# Patient Record
Sex: Female | Born: 1960 | Hispanic: No | Marital: Single | Smoking: Never smoker
Health system: Southern US, Community
[De-identification: ages and names within clinical notes are randomized; demographics above are authoritative.]

## PROBLEM LIST (undated history)

## (undated) ENCOUNTER — Ambulatory Visit (HOSPITAL_COMMUNITY): Admission: EM | Source: Home / Self Care

## (undated) DIAGNOSIS — Z8669 Personal history of other diseases of the nervous system and sense organs: Secondary | ICD-10-CM

## (undated) DIAGNOSIS — H905 Unspecified sensorineural hearing loss: Secondary | ICD-10-CM

## (undated) DIAGNOSIS — E785 Hyperlipidemia, unspecified: Secondary | ICD-10-CM

## (undated) DIAGNOSIS — A182 Tuberculous peripheral lymphadenopathy: Secondary | ICD-10-CM

## (undated) DIAGNOSIS — R011 Cardiac murmur, unspecified: Secondary | ICD-10-CM

## (undated) DIAGNOSIS — R519 Headache, unspecified: Secondary | ICD-10-CM

## (undated) DIAGNOSIS — K219 Gastro-esophageal reflux disease without esophagitis: Secondary | ICD-10-CM

## (undated) DIAGNOSIS — R002 Palpitations: Secondary | ICD-10-CM

## (undated) DIAGNOSIS — IMO0001 Reserved for inherently not codable concepts without codable children: Secondary | ICD-10-CM

## (undated) DIAGNOSIS — D259 Leiomyoma of uterus, unspecified: Secondary | ICD-10-CM

## (undated) DIAGNOSIS — M5412 Radiculopathy, cervical region: Secondary | ICD-10-CM

## (undated) DIAGNOSIS — M26609 Unspecified temporomandibular joint disorder, unspecified side: Secondary | ICD-10-CM

## (undated) DIAGNOSIS — I341 Nonrheumatic mitral (valve) prolapse: Secondary | ICD-10-CM

## (undated) DIAGNOSIS — C50111 Malignant neoplasm of central portion of right female breast: Secondary | ICD-10-CM

## (undated) DIAGNOSIS — R9431 Abnormal electrocardiogram [ECG] [EKG]: Secondary | ICD-10-CM

## (undated) DIAGNOSIS — R51 Headache: Secondary | ICD-10-CM

## (undated) HISTORY — PX: ABDOMINAL HYSTERECTOMY: SHX81

## (undated) HISTORY — PX: OTHER SURGICAL HISTORY: SHX169

## (undated) HISTORY — PX: MOLE REMOVAL: SHX2046

## (undated) HISTORY — DX: Hyperlipidemia, unspecified: E78.5

## (undated) HISTORY — DX: Nonrheumatic mitral (valve) prolapse: I34.1

## (undated) HISTORY — DX: Radiculopathy, cervical region: M54.12

## (undated) HISTORY — PX: BREAST BIOPSY: SHX20

## (undated) HISTORY — DX: Personal history of other diseases of the nervous system and sense organs: Z86.69

## (undated) HISTORY — DX: Malignant neoplasm of central portion of right female breast: C50.111

## (undated) HISTORY — DX: Leiomyoma of uterus, unspecified: D25.9

## (undated) HISTORY — DX: Gastro-esophageal reflux disease without esophagitis: K21.9

## (undated) HISTORY — DX: Abnormal electrocardiogram (ECG) (EKG): R94.31

## (undated) HISTORY — DX: Unspecified temporomandibular joint disorder, unspecified side: M26.609

## (undated) HISTORY — DX: Unspecified sensorineural hearing loss: H90.5

---

## 2000-08-26 ENCOUNTER — Encounter: Payer: Self-pay | Admitting: Internal Medicine

## 2000-08-26 ENCOUNTER — Ambulatory Visit (HOSPITAL_COMMUNITY): Admission: RE | Admit: 2000-08-26 | Discharge: 2000-08-26 | Payer: Self-pay | Admitting: Internal Medicine

## 2000-11-18 ENCOUNTER — Other Ambulatory Visit: Admission: RE | Admit: 2000-11-18 | Discharge: 2000-11-18 | Payer: Self-pay | Admitting: Obstetrics and Gynecology

## 2001-03-06 ENCOUNTER — Encounter: Payer: Self-pay | Admitting: Obstetrics and Gynecology

## 2001-03-06 ENCOUNTER — Ambulatory Visit (HOSPITAL_COMMUNITY): Admission: RE | Admit: 2001-03-06 | Discharge: 2001-03-06 | Payer: Self-pay | Admitting: Obstetrics and Gynecology

## 2001-06-12 ENCOUNTER — Encounter: Admission: RE | Admit: 2001-06-12 | Discharge: 2001-06-12 | Payer: Self-pay | Admitting: Obstetrics and Gynecology

## 2001-06-12 ENCOUNTER — Encounter: Payer: Self-pay | Admitting: Obstetrics and Gynecology

## 2002-02-05 ENCOUNTER — Other Ambulatory Visit: Admission: RE | Admit: 2002-02-05 | Discharge: 2002-02-05 | Payer: Self-pay | Admitting: Obstetrics and Gynecology

## 2002-06-15 ENCOUNTER — Encounter: Admission: RE | Admit: 2002-06-15 | Discharge: 2002-06-15 | Payer: Self-pay | Admitting: Obstetrics and Gynecology

## 2002-06-15 ENCOUNTER — Encounter: Payer: Self-pay | Admitting: Obstetrics and Gynecology

## 2003-02-03 ENCOUNTER — Other Ambulatory Visit: Admission: RE | Admit: 2003-02-03 | Discharge: 2003-02-03 | Payer: Self-pay | Admitting: Obstetrics and Gynecology

## 2003-08-17 ENCOUNTER — Encounter: Admission: RE | Admit: 2003-08-17 | Discharge: 2003-08-17 | Payer: Self-pay | Admitting: Internal Medicine

## 2004-02-08 ENCOUNTER — Other Ambulatory Visit: Admission: RE | Admit: 2004-02-08 | Discharge: 2004-02-08 | Payer: Self-pay | Admitting: Obstetrics and Gynecology

## 2004-09-11 ENCOUNTER — Encounter: Admission: RE | Admit: 2004-09-11 | Discharge: 2004-09-11 | Payer: Self-pay | Admitting: Internal Medicine

## 2004-12-18 ENCOUNTER — Ambulatory Visit: Payer: Self-pay | Admitting: Internal Medicine

## 2004-12-20 ENCOUNTER — Encounter: Admission: RE | Admit: 2004-12-20 | Discharge: 2004-12-20 | Payer: Self-pay | Admitting: Otolaryngology

## 2004-12-21 ENCOUNTER — Encounter: Admission: RE | Admit: 2004-12-21 | Discharge: 2004-12-21 | Payer: Self-pay | Admitting: Otolaryngology

## 2005-10-02 ENCOUNTER — Encounter: Admission: RE | Admit: 2005-10-02 | Discharge: 2005-10-02 | Payer: Self-pay | Admitting: Internal Medicine

## 2005-10-03 ENCOUNTER — Other Ambulatory Visit: Admission: RE | Admit: 2005-10-03 | Discharge: 2005-10-03 | Payer: Self-pay | Admitting: Obstetrics and Gynecology

## 2005-10-09 ENCOUNTER — Ambulatory Visit: Payer: Self-pay | Admitting: Internal Medicine

## 2005-10-23 ENCOUNTER — Ambulatory Visit (HOSPITAL_COMMUNITY): Admission: RE | Admit: 2005-10-23 | Discharge: 2005-10-23 | Payer: Self-pay | Admitting: Internal Medicine

## 2006-02-06 LAB — HM COLONOSCOPY: HM Colonoscopy: NORMAL

## 2006-02-13 ENCOUNTER — Encounter (INDEPENDENT_AMBULATORY_CARE_PROVIDER_SITE_OTHER): Payer: Self-pay | Admitting: *Deleted

## 2006-02-13 ENCOUNTER — Ambulatory Visit (HOSPITAL_COMMUNITY): Admission: RE | Admit: 2006-02-13 | Discharge: 2006-02-13 | Payer: Self-pay | Admitting: Physician Assistant

## 2006-09-04 ENCOUNTER — Ambulatory Visit: Payer: Self-pay | Admitting: Internal Medicine

## 2006-09-04 LAB — CONVERTED CEMR LAB
ALT: 14 units/L (ref 0–40)
Albumin: 3.3 g/dL — ABNORMAL LOW (ref 3.5–5.2)
BUN: 11 mg/dL (ref 6–23)
Basophils Relative: 0.6 % (ref 0.0–1.0)
Chloride: 107 meq/L (ref 96–112)
GFR calc Af Amer: 138 mL/min
GFR calc non Af Amer: 114 mL/min
HDL: 44 mg/dL (ref >39.0)
Hemoglobin, Urine: NEGATIVE
Ketones, ur: NEGATIVE mg/dL
MCHC: 33.7 g/dL (ref 30.0–36.0)
Monocytes Absolute: 0.5 10*3/uL (ref 0.2–0.7)
Monocytes Relative: 10.4 % (ref 3.0–11.0)
Neutro Abs: 1.7 10*3/uL (ref 1.4–7.7)
Neutrophils Relative %: 34.9 % — ABNORMAL LOW (ref 43.0–77.0)
Platelets: 214 10*3/uL (ref 150–400)
Potassium: 4 meq/L (ref 3.5–5.1)
RBC: 5.25 M/uL — ABNORMAL HIGH (ref 3.87–5.11)
Specific Gravity, Urine: 1.025 (ref 1.000–1.03)
Total Bilirubin: 0.6 mg/dL (ref 0.3–1.2)
Total CHOL/HDL Ratio: 5.1
Total Protein, Urine: NEGATIVE mg/dL
Total Protein: 7.2 g/dL (ref 6.0–8.3)
Triglycerides: 171 mg/dL — ABNORMAL HIGH (ref 0–149)
Urobilinogen, UA: 0.2 (ref 0.0–1.0)
VLDL: 34 mg/dL (ref 0–40)

## 2006-09-11 ENCOUNTER — Ambulatory Visit: Payer: Self-pay | Admitting: Internal Medicine

## 2006-11-26 ENCOUNTER — Encounter: Admission: RE | Admit: 2006-11-26 | Discharge: 2006-11-26 | Payer: Self-pay | Admitting: Internal Medicine

## 2007-05-13 ENCOUNTER — Telehealth: Payer: Self-pay | Admitting: Internal Medicine

## 2007-05-14 DIAGNOSIS — I059 Rheumatic mitral valve disease, unspecified: Secondary | ICD-10-CM

## 2007-05-16 ENCOUNTER — Ambulatory Visit (HOSPITAL_COMMUNITY): Admission: RE | Admit: 2007-05-16 | Discharge: 2007-05-17 | Payer: Self-pay | Admitting: Obstetrics and Gynecology

## 2007-05-16 ENCOUNTER — Encounter (INDEPENDENT_AMBULATORY_CARE_PROVIDER_SITE_OTHER): Payer: Self-pay | Admitting: Obstetrics and Gynecology

## 2007-05-26 ENCOUNTER — Encounter: Payer: Self-pay | Admitting: Internal Medicine

## 2007-05-26 ENCOUNTER — Ambulatory Visit: Payer: Self-pay

## 2007-05-27 ENCOUNTER — Encounter: Payer: Self-pay | Admitting: Internal Medicine

## 2007-07-22 ENCOUNTER — Encounter: Payer: Self-pay | Admitting: *Deleted

## 2007-07-22 DIAGNOSIS — D259 Leiomyoma of uterus, unspecified: Secondary | ICD-10-CM | POA: Insufficient documentation

## 2007-07-22 DIAGNOSIS — J309 Allergic rhinitis, unspecified: Secondary | ICD-10-CM

## 2007-07-22 DIAGNOSIS — K219 Gastro-esophageal reflux disease without esophagitis: Secondary | ICD-10-CM | POA: Insufficient documentation

## 2007-07-22 DIAGNOSIS — Z9189 Other specified personal risk factors, not elsewhere classified: Secondary | ICD-10-CM | POA: Insufficient documentation

## 2007-09-01 ENCOUNTER — Ambulatory Visit: Payer: Self-pay | Admitting: Internal Medicine

## 2007-09-01 DIAGNOSIS — M5030 Other cervical disc degeneration, unspecified cervical region: Secondary | ICD-10-CM | POA: Insufficient documentation

## 2007-09-02 ENCOUNTER — Encounter: Payer: Self-pay | Admitting: Internal Medicine

## 2007-09-08 ENCOUNTER — Encounter: Payer: Self-pay | Admitting: Internal Medicine

## 2007-09-11 ENCOUNTER — Encounter: Payer: Self-pay | Admitting: Internal Medicine

## 2007-09-29 ENCOUNTER — Encounter: Payer: Self-pay | Admitting: Internal Medicine

## 2007-11-07 ENCOUNTER — Encounter: Payer: Self-pay | Admitting: Internal Medicine

## 2007-12-03 ENCOUNTER — Encounter: Payer: Self-pay | Admitting: Internal Medicine

## 2007-12-15 ENCOUNTER — Encounter: Admission: RE | Admit: 2007-12-15 | Discharge: 2007-12-15 | Payer: Self-pay | Admitting: Internal Medicine

## 2008-03-04 ENCOUNTER — Telehealth: Payer: Self-pay | Admitting: Internal Medicine

## 2008-03-22 ENCOUNTER — Telehealth: Payer: Self-pay | Admitting: Internal Medicine

## 2008-12-03 ENCOUNTER — Telehealth (INDEPENDENT_AMBULATORY_CARE_PROVIDER_SITE_OTHER): Payer: Self-pay | Admitting: *Deleted

## 2008-12-27 ENCOUNTER — Ambulatory Visit: Payer: Self-pay | Admitting: Internal Medicine

## 2008-12-27 LAB — CONVERTED CEMR LAB
ALT: 15 units/L (ref 0–35)
BUN: 15 mg/dL (ref 6–23)
Basophils Absolute: 0 10*3/uL (ref 0.0–0.1)
Basophils Relative: 0.7 % (ref 0.0–3.0)
Bilirubin Urine: NEGATIVE
Bilirubin, Direct: 0.2 mg/dL (ref 0.0–0.3)
CO2: 32 meq/L (ref 19–32)
Calcium: 9.7 mg/dL (ref 8.4–10.5)
Chloride: 103 meq/L (ref 96–112)
Cholesterol: 229 mg/dL — ABNORMAL HIGH (ref 0–200)
Eosinophils Absolute: 0.5 10*3/uL (ref 0.0–0.7)
HDL: 52.7 mg/dL (ref >39.00)
Hemoglobin, Urine: NEGATIVE
Hemoglobin: 14.1 g/dL (ref 12.0–15.0)
Lymphocytes Relative: 43.7 % (ref 12.0–46.0)
MCV: 79.5 fL (ref 78.0–100.0)
Monocytes Absolute: 0.5 10*3/uL (ref 0.1–1.0)
Total Bilirubin: 0.9 mg/dL (ref 0.3–1.2)
Total CHOL/HDL Ratio: 4
Total Protein, Urine: NEGATIVE mg/dL
Triglycerides: 145 mg/dL (ref 0.0–149.0)
WBC: 6.9 10*3/uL (ref 4.5–10.5)

## 2008-12-29 ENCOUNTER — Ambulatory Visit: Payer: Self-pay | Admitting: Internal Medicine

## 2008-12-29 DIAGNOSIS — M26609 Unspecified temporomandibular joint disorder, unspecified side: Secondary | ICD-10-CM | POA: Insufficient documentation

## 2008-12-30 ENCOUNTER — Encounter: Admission: RE | Admit: 2008-12-30 | Discharge: 2008-12-30 | Payer: Self-pay | Admitting: Internal Medicine

## 2009-01-03 ENCOUNTER — Telehealth: Payer: Self-pay | Admitting: Internal Medicine

## 2010-02-01 ENCOUNTER — Telehealth: Payer: Self-pay | Admitting: Internal Medicine

## 2010-02-20 ENCOUNTER — Ambulatory Visit: Payer: Self-pay | Admitting: Internal Medicine

## 2010-02-20 LAB — CONVERTED CEMR LAB
Albumin: 3.9 g/dL (ref 3.5–5.2)
Basophils Absolute: 0.1 10*3/uL (ref 0.0–0.1)
Basophils Relative: 0.9 % (ref 0.0–3.0)
Bilirubin Urine: NEGATIVE
CO2: 31 meq/L (ref 19–32)
Calcium: 9.6 mg/dL (ref 8.4–10.5)
GFR calc non Af Amer: 119.44 mL/min (ref >60)
HCT: 37.6 % (ref 36.0–46.0)
Hemoglobin, Urine: NEGATIVE
Hemoglobin: 12.7 g/dL (ref 12.0–15.0)
Ketones, ur: NEGATIVE mg/dL
Lymphocytes Relative: 40.8 % (ref 12.0–46.0)
Lymphs Abs: 2.4 10*3/uL (ref 0.7–4.0)
MCV: 79 fL (ref 78.0–100.0)
Platelets: 223 10*3/uL (ref 150.0–400.0)
RBC: 4.76 M/uL (ref 3.87–5.11)
Sodium: 141 meq/L (ref 135–145)
Specific Gravity, Urine: >=1.03 (ref 1.000–1.030)
Triglycerides: 149 mg/dL (ref 0.0–149.0)
Urine Glucose: NEGATIVE mg/dL
Urobilinogen, UA: 0.2 (ref 0.0–1.0)
Vit D, 25-Hydroxy: 60 ng/mL (ref 30–89)
WBC: 6 10*3/uL (ref 4.5–10.5)
pH: 5.5 (ref 5.0–8.0)

## 2010-02-22 ENCOUNTER — Ambulatory Visit: Payer: Self-pay | Admitting: Internal Medicine

## 2010-02-22 ENCOUNTER — Encounter: Payer: Self-pay | Admitting: Internal Medicine

## 2010-02-23 ENCOUNTER — Encounter: Admission: RE | Admit: 2010-02-23 | Discharge: 2010-02-23 | Payer: Self-pay | Admitting: Internal Medicine

## 2010-06-18 ENCOUNTER — Encounter: Payer: Self-pay | Admitting: Internal Medicine

## 2010-06-27 NOTE — Assessment & Plan Note (Signed)
Summary: CPX/NWS   Vital Signs:  Patient profile:   50 year old female Height:      65 inches Weight:      114 pounds BMI:     19.04 O2 Sat:      97 % on Room air Temp:     97.0 degrees F oral Pulse rate:   75 / minute BP sitting:   110 / 60  (left arm) Cuff size:   regular  Vitals Entered By: Cathy Woodward CMA (February 22, 2010 9:58 AM)  O2 Flow:  Room air CC: pt here for cpx, she would like to discuss changing omeprazole to pantoprazole. Pt also needs refill on flonase/ ab Comments Pt will get flu and tdap today/ ab  Vision Screening:      Vision Comments: Last Eye exam oct 2010 with change in lens prescription. DR Cathy Woodward   Primary Care Cathy Woodward:  [facility]  CC:  pt here for cpx and she would like to discuss changing omeprazole to pantoprazole. Pt also needs refill on flonase/ ab.  History of Present Illness: Presents for routine exam.   She does have continued acid dyspepsia and reflux. She does take sodium bicarb which does give relief. Due to a possible diagnosis of menier's she is careful with sodium. Would like to switch pantoprazole 40mg .   She is current with gyn. Has mammogram for tomorrow.  Last colonoscopy '07 - Dr. Ranae Woodward with a sessile polyp.  Hearing loss continues to be a problem along with tinnitis - audible heart beat right ear. She has been evaluated by Dr. Dorma Woodward in the past and will see him today.   Chart reviewed including last 2 D echo which did reveal MVP with mild MR. Dr. Jens Woodward writes of a possible h/o valsalva aneurysm. No known history to the patient nor is there any documentation in her medical record of the same. The 2D echo was negative in this regard.   Current Medications (verified): 1)  Nasal Saline 0.65 %  Soln (Saline) .... Use As Directed. 2)  Calcium 600 600 Mg  Tabs (Calcium Carbonate) .... Take One Tablet Once Daily 3)  Vitamin C 500 Mg  Tabs (Ascorbic Acid) .... Take One Tablet Once Daily 4)  Vitamin E 400 Unit  Caps (Vitamin E)  .... Take One Capsule Daily 5)  Fexofenadine Hcl 60 Mg  Tabs (Fexofenadine Hcl) .... Take 1 Tablet By Mouth Two Times A Day 6)  Flonase 50 Mcg/act Susp (Fluticasone Propionate) .Marland Kitchen.. 1 Spray Each Nostril Daily 7)  Cyclobenzaprine Hcl 10 Mg Tabs (Cyclobenzaprine Hcl) .Marland Kitchen.. 1 By Mouth Q8 As Needed 8)  Omeprazole 20 Mg Cpdr (Omeprazole) .Marland Kitchen.. 1 By Mouth Once Daily 9)  Topicort 0.25 % Crea (Desoximetasone)  Allergies (verified): 1)  ! * Minocyclin 2)  ! Erythromycin 3)  [name]  Past History:  Past Medical History: Last updated: 01-26-09 TMJ SYNDROME (ICD-524.60) CERVICAL RADICULOPATHY, RIGHT (ICD-723.4) * Hx of EXCISION OF CERVICAL LYMPH NODE ANTERIOR CERVICAL CHAIN CHICKENPOX, HX OF (ICD-V15.9) FIBROIDS, UTERUS (ICD-218.9) HEARING LOSS, SENSORINEURAL RIGHT (ICD-389.10) ALLERGIC RHINITIS (ICD-477.9) GERD (ICD-530.81) MITRAL VALVE PROLAPSE (ICD-424.0)      Past Surgical History: Last updated: Jan 26, 2009 * Hx of EXCISION OF CERVICAL LYMPH NODE ANTERIOR CERVICAL CHAIN Hysterectomy '09 (robotic)  Family History: Last updated: January 26, 2009 Father - deceased 106: esophageal cancer, DM,  Mother  - 1935: rheumatologic disorder with spontaneous resolution, antero-septal MI '98, hypothyroidism, HTN MAunt - breast cancer 2nd degreee history colon cancer  Risk Factors: Alcohol Use:  0 (12/29/2008) Caffeine Use: caffeinated tea (12/29/2008) Diet: healthy diet (12/29/2008) Exercise: no (12/29/2008)  Risk Factors: Smoking Status: never (12/29/2008)  Social History: 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 finish Residency [employer] Cathy Woodward. 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 Will be traveling to [place] in the next several months. (Sept '11)  Review of Systems       The patient complains of decreased hearing and severe indigestion/heartburn.  The patient denies anorexia, fever, weight loss, weight gain, hoarseness, chest pain,  syncope, dyspnea on exertion, peripheral edema, prolonged cough, headaches, abdominal pain, melena, hematochezia, incontinence, muscle weakness, transient blindness, difficulty walking, depression, abnormal bleeding, enlarged lymph nodes, angioedema, and breast masses.    Physical Exam  General:  Slender woman in no distress. Head:  normocephalic, atraumatic, and no abnormalities observed.   Eyes:  vision grossly intact, pupils equal, and pupils round.   C&S clear. Ears:  Right EAC clear and TM normal. Left EAC with cerumen but no impaction. TM appears normal Nose:  no external deformity and no external erythema.   Mouth:  good dentition, no gingival abnormalities, and pharynx pink and moist.   Neck:  supple, full ROM, no masses, no thyromegaly, and no carotid bruits.   Chest Wall:  No deformities, masses, or tenderness noted. Breasts:  deferred to gyn Lungs:  Normal respiratory effort, chest expands symmetrically. Lungs are clear to auscultation, no crackles or wheezes. Heart:  Regular rate and rhythm. Distinct mitral valve click. I-II/VI soft murmur at apex. Abdomen:  soft, non-tender, normal bowel sounds, no guarding, and no hepatomegaly.   Genitalia:  deferred to gyn Msk:  normal ROM, no joint tenderness, no joint swelling, no redness over joints, and no joint deformities.   Pulses:  2+ radial and DP pulses. difficulty finding PT pulses Extremities:  No clubbing, cyanosis, edema, or deformity noted with normal full range of motion of all joints.   Neurologic:  alert & oriented X3, cranial nerves II-XII intact-hearing not tested, strength normal in all extremities, gait normal, and DTRs symmetrical and normal.   Skin:  turgor normal, color normal, and no suspicious lesions.   Cervical Nodes:  no anterior cervical adenopathy and no posterior cervical adenopathy.   Psych:  Oriented X3, memory intact for recent and remote, normally interactive, and good eye contact.     Impression &  Recommendations:  Problem # 1:  HEARING LOSS, SENSORINEURAL RIGHT (ICD-389.10) On-going problem. She will be seeing Dr. Dorma Woodward today.  Problem # 2:  ALLERGIC RHINITIS (ICD-477.9) Symptoms wax and wane, location dependent. She continues to get relief with fexofenadine and Flonase.  Plan - Rx renewed.  Her updated medication list for this problem includes:    Nasal Saline 0.65 % Soln (Saline) ..... Use as directed.    Fexofenadine Hcl 60 Mg Tabs (Fexofenadine hcl) .Marland Kitchen... Take 1 tablet by mouth two times a day    Flonase 50 Mcg/act Susp (Fluticasone propionate) .Marland Kitchen... 1 spray each nostril daily  Problem # 3:  GERD (ICD-530.81) Break-through symptoms. She does get some relief with elevation of HOB. Pantoprazole does work well and she will sometimes supplement with baking soa in water at bedtime.  Plan - new Rx for pantoprazole.  The following medications were removed from the medication list:    Cimetidine 800 Mg Tabs (Cimetidine) .Marland Kitchen... 1 by mouth at bedtime Her updated medication list for this problem includes:    Pantoprazole Sodium 40 Mg Tbec (Pantoprazole sodium) .Marland KitchenMarland KitchenMarland KitchenMarland Kitchen  1 p qam  Problem # 4:  MITRAL VALVE PROLAPSE (ICD-424.0) 2 D echo reviewed as noted. No significant pathology, i.e. change in ventricle. Her symptoms are minimal. She does have intermittent palpitatons: both tachycardic and occasionally bradycardic. She is asymptomatic in either case, i.e. no syncope, near-syncope, SOB or chest pain.   Plan - if symptoms progress an event recorder my be helpful to elucidate a definite arrythmia.   Problem # 5:  Preventive Health Care (ICD-V70.0) Interval history without major illness, injury or surgery. Limited physical exam is normal. Labs are normal including lipid panel. Vitamin D level is 60!! She will continue daily Vit D @ 1000 iu daily. She has up-coming gyn visit and mammogram. She is current with colorectal cancer screening. Immunization: Tdap today, influenza vaccine today. She is  current with immunizations for international travel.   Currently enjoying her assigment in [place] - she will have been there a year at the completion of her current assignment. She looks forward to a family gathering in [place]. SHe does follow a healthy diet. She does exercise.   In summary - a delightful colleague who appears to be medically stable and doing well. She will return as needed or 1 year. If needed will try to provide assistance long distance, i.e. Rx for event recorder if she continues to have palpitations.   Complete Medication List: 1)  Nasal Saline 0.65 % Soln (Saline) .... Use as directed. 2)  Calcium 600 600 Mg Tabs (Calcium carbonate) .... Take one tablet once daily 3)  Vitamin C 500 Mg Tabs (Ascorbic acid) .... Take one tablet once daily 4)  Vitamin E 400 Unit Caps (Vitamin e) .... Take one capsule daily 5)  Fexofenadine Hcl 60 Mg Tabs (Fexofenadine hcl) .... Take 1 tablet by mouth two times a day 6)  Flonase 50 Mcg/act Susp (Fluticasone propionate) .Marland Kitchen.. 1 spray each nostril daily 7)  Cyclobenzaprine Hcl 10 Mg Tabs (Cyclobenzaprine hcl) .Marland Kitchen.. 1 by mouth q8 as needed 8)  Pantoprazole Sodium 40 Mg Tbec (Pantoprazole sodium) .Marland Kitchen.. 1 p qam 9)  Topicort 0.25 % Crea (Desoximetasone)  Other Orders: Tdap => 64yrs IM (04540) Admin 1st Vaccine (98119) Admin 1st Vaccine (14782) Flu Vaccine 63yrs + (95621) No Charge Patient Arrived (NCPA0) (NCPA0)   nt: Cathy Woodward Note: All result statuses are Final unless otherwise noted.  Tests: (1) BMP (METABOL)   Sodium                    141 mEq/L                   135-145   Potassium                 5.0 mEq/L                   3.5-5.1   Chloride                  106 mEq/L                   96-112   Carbon Dioxide            31 mEq/L                    19-32   Glucose                   88 mg/dL  70-99   BUN                       15 mg/dL                    1-61   Creatinine                0.6 mg/dL                    0.9-6.0   Calcium                   9.6 mg/dL                   4.5-40.9   GFR                       119.44 mL/min               >60  Tests: (2) CBC Platelet w/Diff (CBCD)   White Cell Count          6.0 K/uL                    4.5-10.5   Red Cell Count            4.76 Mil/uL                 3.87-5.11   Hemoglobin                12.7 g/dL                   81.1-91.4   Hematocrit                37.6 %                      36.0-46.0   MCV                       79.0 fl                     78.0-100.0   MCHC                      33.7 g/dL                   78.2-95.6   RDW                       14.2 %                      11.5-14.6   Platelet Count            223.0 K/uL                  150.0-400.0   Neutrophil %         [L]  42.1 %                      43.0-77.0   Lymphocyte %              40.8 %                      12.0-46.0   Monocyte %  7.8 %                       3.0-12.0   Eosinophils%         [H]  8.4 %                       0.0-5.0   Basophils %               0.9 %                       0.0-3.0   Neutrophill Absolute      2.5 K/uL                    1.4-7.7   Lymphocyte Absolute       2.4 K/uL                    0.7-4.0   Monocyte Absolute         0.5 K/uL                    0.1-1.0  Eosinophils, Absolute                             0.5 K/uL                    0.0-0.7   Basophils Absolute        0.1 K/uL                    0.0-0.1  Tests: (3) Hepatic/Liver Function Panel (HEPATIC)   Total Bilirubin           0.3 mg/dL                   0.4-5.4   Direct Bilirubin          0.1 mg/dL                   0.9-8.1   Alkaline Phosphatase      75 U/L                      39-117   AST                       17 U/L                      0-37   ALT                       16 U/L                      0-35   Total Protein             6.7 g/dL                    1.9-1.4   Albumin                   3.9 g/dL                    7.8-2.9  Tests: (4) TSH (TSH)   FastTSH  1.45 uIU/mL                 0.35-5.50  Tests: (5) Lipid Panel (LIPID)   Cholesterol          [H]  213 mg/dL                   0-454     ATP III Classification            Desirable:  < 200 mg/dL                    Borderline High:  200 - 239 mg/dL               High:  > = 240 mg/dL   Triglycerides             149.0 mg/dL                 0.9-811.9     Normal:  <150 mg/dL     Borderline High:  147 - 199 mg/dL   HDL                       82.95 mg/dL                 >62.13   VLDL Cholesterol          29.8 mg/dL                  0.8-65.7  CHO/HDL Ratio:  CHD Risk                             4                    Men          Women     1/2 Average Risk     3.4          3.3     Average Risk          5.0          4.4     2X Average Risk          9.6          7.1     3X Average Risk          15.0          11.0                           Tests: (6) UDip w/Micro (URINE)   Color                     Yellow       RANGE:  Yellow;Lt. Yellow   Clarity                   CLEAR                       Clear   Specific Gravity          >=1.030                     1.000 - 1.030   Urine Ph                  5.5  5.0-8.0   Protein                   NEGATIVE                    Negative   Urine Glucose             NEGATIVE                    Negative   Ketones                   NEGATIVE                    Negative   Urine Bilirubin           NEGATIVE                    Negative   Blood                     NEGATIVE                    Negative   Urobilinogen              0.2                         0.0 - 1.0   Leukocyte Esterace        TRACE                       Negative   Nitrite                   NEGATIVE                    Negative   Urine WBC                 0-2/hpf                     0-2/hpf   Urine Epith               Rare(0-4/hpf)               Rare(0-4/hpf)   Urine Bacteria            Rare(<10/hpf)               None  Tests: (7) Cholesterol LDL - Direct (DIRLDL)   Cholesterol LDL - Direct                             137.0 mg/dL Tests: (1) Vitamin D (16-XWRUEAV) (40981)  Vitamin D (25-Hydroxy)                             60 ng/mL                    30-89Prescriptions: TOPICORT 0.25 % CREA (DESOXIMETASONE)   #0 x 0   Entered and Authorized by:   Jacques Navy MD   Signed by:   Cathy Woodward CMA on 02/22/2010   Method used:   Handwritten   RxID:   [id number] FEXOFENADINE HCL 60 MG  TABS (FEXOFENADINE HCL) Take 1 tablet by mouth two times a day  #180 x 3  Entered and Authorized by:   Jacques Navy MD   Signed by:   Cathy Woodward CMA on 02/22/2010   Method used:   Handwritten   RxID:   [id number] FLONASE 50 MCG/ACT SUSP (FLUTICASONE PROPIONATE) 1 spray each nostril daily  #3 mth x 3   Entered and Authorized by:   Jacques Navy MD   Signed by:   Jacques Navy MD on 02/22/2010   Method used:   Electronically to        [facility]* (retail)             ,          Ph: [phone]       Fax: [phone]   RxID:   [id number] PANTOPRAZOLE SODIUM 40 MG TBEC (PANTOPRAZOLE SODIUM) 1 p qAM  #90 x 3   Entered and Authorized by:   Jacques Navy MD   Signed by:   Jacques Navy MD on 02/22/2010   Method used:   Electronically to        [facility]* (retail)             ,          Ph: [phone]       Fax: [phone]   RxID:   [id number]    Immunizations Administered:  Tetanus Vaccine:    Vaccine Type: Tdap    Site: left deltoid    Mfr: GlaxoSmithKline    Dose: 0.5 ml    Route: IM    Given by: Ami Bullins CMA    Exp. Date: 02/16/2012    Lot #: [id number]    VIS given: 04/14/08 version given February 22, 2010.     Flu Vaccine Consent Questions     Do you have a history of severe allergic reactions to this vaccine? no    Any prior history of allergic reactions to egg and/or gelatin? no    Do you have a sensitivity to the preservative Thimersol? no    Do you have a past history of Guillan-Barre  Syndrome? no    Do you currently have an acute febrile illness? no    Have you ever had a severe reaction to latex? no    Vaccine information given and explained to patient? yes    Are you currently pregnant? no    Lot Number:[id number]   Exp Date:11/25/2010   Site Given  Right Deltoid IMlu

## 2010-06-27 NOTE — Progress Notes (Signed)
Summary: LABs  Phone Note Call from Patient Call back at Work Phone [phone]   Summary of Call: Patient is requesting Vit D level added to upcomming labs?  Initial call taken by: Lamar Sprinkles, CMA,  February 01, 2010 11:08 AM  Follow-up for Phone Call        ok 524.6 Follow-up by: Jacques Navy MD,  February 01, 2010 3:36 PM  Additional Follow-up for Phone Call Additional follow up Details #1::        Pt informed  Additional Follow-up by: Lamar Sprinkles, CMA,  February 01, 2010 4:42 PM

## 2010-09-26 ENCOUNTER — Ambulatory Visit (INDEPENDENT_AMBULATORY_CARE_PROVIDER_SITE_OTHER): Payer: BC Managed Care – PPO | Admitting: Internal Medicine

## 2010-09-27 ENCOUNTER — Ambulatory Visit (INDEPENDENT_AMBULATORY_CARE_PROVIDER_SITE_OTHER): Payer: BC Managed Care – PPO | Admitting: Internal Medicine

## 2010-09-27 DIAGNOSIS — Z111 Encounter for screening for respiratory tuberculosis: Secondary | ICD-10-CM

## 2010-09-27 DIAGNOSIS — R1031 Right lower quadrant pain: Secondary | ICD-10-CM

## 2010-09-27 NOTE — Progress Notes (Signed)
  Subjective:    Patient ID: Cathy Woodward, female    DOB: 1961-01-29, 50 y.o.   MRN: [id number]  HPI Persisten RLQ pain that can come and go. The last episode persisted for 2 weeks with pain several times a day. No fevers documented, no rigors. No change in bowel habit. Had colonoscopy '06 (Jothi Mann)-unremarkable study. Weight is variable - per usual. She is s/p hysterectomy.  Past Medical History  Diagnosis Date  . Temporomandibular joint disorders, unspecified   . Brachial neuritis or radiculitis NOS   . Chickenpox   . Leiomyoma of uterus, unspecified   . Right-sided sensorineural hearing loss   . Allergic rhinitis   . GERD (gastroesophageal reflux disease)   . MVP (mitral valve prolapse)    Past Surgical History  Procedure Date  . Excision of cervical lymph node anterior cervical chain   . Robotic assisted lap vaginal hysterectomy 2009   Family History  Problem Relation Age of Onset  . Rheumatologic disease Mother   . Heart attack Mother     Anterio septal MI '98  . Thyroid disease Mother     hypo  . Hypertension Mother   . Diabetes Father   . Breast cancer Maternal Aunt   . Colon cancer Other    History   Social History  . Marital Status: Single    Spouse Name: N/A    Number of Children: N/A  . Years of Education: N/A   Occupational History  . Not on file.   Social History Main Topics  . Smoking status: Not on file  . Smokeless tobacco: Not on file  . Alcohol Use:   . Drug Use:   . Sexually Active:    Other Topics Concern  . 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 Will be traveling to [place] in the next several mths (Sept '11)       Review of Systems  Negative review of systems     Objective:   Physical Exam Slender woman in no acute distress. HEENT- C&S clear Abdomen - BS+ x  4, no HSM, no guarding or rebound tenderness. Point of tenderness is very low in the right lower quadrant near the inquinal ligament. Too lateral to be an ovary.. NO mass.       Assessment & Plan:  1. RLQ abdominal pain - exam suggests MSK origin of pain vs smoldering appendicitis vs (remote possibility) right ovarian diseases.  Plan - CT abdomen/pelvis with contrast.

## 2010-09-28 ENCOUNTER — Encounter: Payer: Self-pay | Admitting: Internal Medicine

## 2010-09-28 ENCOUNTER — Ambulatory Visit (INDEPENDENT_AMBULATORY_CARE_PROVIDER_SITE_OTHER)
Admission: RE | Admit: 2010-09-28 | Discharge: 2010-09-28 | Disposition: A | Discharge status: Home / Self Care | Payer: BC Managed Care – PPO | Source: Ambulatory Visit | Attending: Internal Medicine | Admitting: Internal Medicine

## 2010-09-28 DIAGNOSIS — R109 Unspecified abdominal pain: Secondary | ICD-10-CM

## 2010-09-28 MED ORDER — IOHEXOL 300 MG/ML  SOLN
80.0000 mL | Freq: Once | INTRAMUSCULAR | Status: AC | PRN
  Administered 2010-09-28: 80 mL via INTRAVENOUS

## 2010-09-29 ENCOUNTER — Encounter: Payer: Self-pay | Admitting: Internal Medicine

## 2010-09-29 NOTE — Progress Notes (Signed)
Addended byRosalio Macadamia, Melvin Marmo on: 09/29/2010 10:43 AM   Modules accepted: Orders

## 2010-10-10 NOTE — Op Note (Signed)
NAME:  Cathy Woodward, Cathy Woodward               ACCOUNT NO.:  [id number]   MEDICAL RECORD NO.:  [id number]          PATIENT TYPE:  OIB   LOCATION:  1529                         FACILITY:  [facility]   PHYSICIAN:  Crist Fat. Rivard, M.D. DATE OF BIRTH:  1961-05-14   DATE OF PROCEDURE:  05/16/2007  DATE OF DISCHARGE:                               OPERATIVE REPORT   PREOPERATIVE DIAGNOSIS:  Symptomatic uterine fibroids.   POSTOPERATIVE DIAGNOSIS:  Symptomatic uterine fibroids.   ANESTHESIA:  General.   PROCEDURE:  Robotic assisted hysterectomy.   SURGEON:  Crist Fat. Rivard, M.D.   ASSISTANTMarquis Lunch. Adline Peals.   PROCEDURE IN DETAIL:  After being informed of the planned procedure with  possible complications including bleeding, infection, injury to bowel,  bladder or ureters and need for laparotomy, informed consent was  obtained.  The patient is taken to OR #10, given general anesthesia with  endotracheal intubation without complication.  She is placed in the  lithotomy position with arms padded and tucked on each side and knee  high sequential compression devices.  Her chest is taped to the table.  She is prepped and draped in a sterile fashion.   Gyn exam reveals a greatly enlarged uterus 16 weeks in size, filling the  pelvic cavity completely, not mobile and adnexa are not felt.  We  attempt to insert a weighted speculum into the vagina but the vaginal  opening is too small and would not allow a normal sized weighted  speculum.  A Bonnano weighted speculum is then used successfully.  With  Deaver retraction of the anterior vaginal wall we are able to visualize  the cervix and grasp it with a tenaculum forceps placed on the anterior  lip of the cervix.  The cervix was then easily dilated using Hegar  dilator until #31 and the uterus is sounded at 9 cm.  A 6 cm RUMI  intrauterine manipulator with a #3 KOHring as well as a vaginal occluder  are assembled together.  Attempt at insertion is  unsuccessful due to the  small vaginal opening and it is absolutely impossible to enter the  KOHring in the vagina.  Decision is made to proceed with a midline  episiotomy which releases enough space to place the RUMI intrauterine  manipulator with its KOHring and vaginal occluder.  The KOHring is then  sutured to the cervix with 3-0 Vicryl suture.  The RUMI balloon is  inflated with 2 mL of saline.  A #10 Foley catheter is inserted without  difficulty.   Measuring from the fundus of the uterus at 12 cm we decide our camera  trocar placement and infiltrate that area with Marcaine 0.25 and perform  a vertical 10 mm incision which is brought down sharply to the fascia.  The fascia is identified easily and grasped with a tenaculum with the  Kocher forceps and incised.  Peritoneum is entered bluntly.  A  pursestring suture of zero Vicryl is placed on the fascia and a 10 mm  Hassan trocar is inserted easily in the abdominal cavity, held in place  with the  pursestring suture.  This allows for insufflation of the  pneumoperitoneum with CO2 at a maximum pressure of 16 mmHg.  The camera  is inserted for first evaluation of the abdominal pelvic cavity.  The  uterus is greatly enlarged by multiple fibroids, giving it a total  volume of approximately 16 weeks.  The fundus of the uterus reaches the  sacral promontory.  The dominant fibroid is anterior and measures  approximately 12 cm.  There is also multiple posterior fibroid and a  large left lower uterine fibroid.  Adnexa are seen and normal.  Appendix  is normal.  Liver is normal.  Gallbladder is normal.   Our robotic trocar placement is then decided and we placed two 8 mm  robotic trocar on the left and one 8 mm robotic trocar on the right as  well as a 10 mm patient side assistant trocar on the right after  infiltrating every site with Marcaine 0.25 and this was done under  direct visualization.   We can now safely dock the robot.  A  monopolar scissors is inserted in  arm #1, a PK gyrus forceps in arm #2 and a tenaculum in arm #3.   Console time starts at 9:50.  The length of time necessary to start  console time is explained by the fact that we had to change the camera  3x due to a problem with color monitoring.  Once seated we are able to  use the fourth arm to mobilize the uterus and we proceed systematically.  Starting with the right side we cauterize the utero-ovarian ligament and  section it, cauterize the tube and section it and cauterize the round  ligament and section it. This gives [place] access to the anterior leaf of  the broad ligament all the way to just above the fundus of the uterus  but we are unable to go into the anterior cul-de-sac due to the large  dominant anterior fibroid.  The posterior sheath of the broad ligament  is accessible easily by mobilizing the uterus even more anteriorly using  the fourth arm.  This posterior sheath is dissected down all the way to  the [facility] keeping the ureter under direct visualization.  With this  posterior approach we are able to completely skeletonize the uterine  vessel bundle.  This bundle is then grasped with the PK forceps and  cauterized.  We then go on the left side and proceed in the same  fashion, cauterizing and sectioning the utero-ovarian ligament,  cauterizing and sectioning the tube, cauterizing and sectioning the  round ligament.  Again, the anterior sheath of the broad ligament can  partially be section and dissected down but not all the way anteriorly.  We proceed with a posterior approach again and dissect the posterior  leaf of the broad ligament all the way down to the [facility] keeping the  ureter under direct visualization.  To achieve that we have to proceed  with lysis of adhesion with the sigmoid colon and the left pelvic wall.  Again, the uterine artery bundle is isolated away from the ureter,  grasped with the PK forceps and cauterized and  then it was sectioned.  With pressure applied on the KOHring we can now proceed with three-  quarters of the colpotomy using monopolar scissors after insufflating  the vaginal occluder.  Now using the fourth arm we completely move the  uterus to the left which gives [place] access to the anterior cul-de-sac and  we  are able to finish opening the anterior sheath of the anterior broad  ligament and bring the bladder down away from the vaginal fornix.  We  then mobilized the uterus to the extreme left and do the same on the  right side with the right anterior sheath of the broad ligament.  We now  are able to finish the colpotomy anteriorly with monopolar scissors and  release the uterus entirely.   The 10 mm patient side assistant trocar is replaced for a 15 mm  morcellator.  The total console time to this point is 1 hour and 30  minutes.   We then proceed with systematic morcellation of the whole specimen which  takes another 1-1/2 hours.  We then irrigate profusely with warm saline  and systematically inspect the abdominal pelvic cavity to remove all  small debris of the specimen.   The instruments are then changed for a suture cut in arm #1, a needle  holder in arm #2 and PK forceps in arm #3.  Using zero Vicryl we proceed  with closure of the vaginal cuff in figure-of-eight stitches.  We then  irrigate profusely with warm saline and complete hemostasis using  cauterization on peritoneal edges.  All pedicles are inspected and  hemostasis is deemed adequate.  Both ureters are well visualized, are  normal with good peristalsis and no dilatation.   An Interceed sheet is separated in half and deposited in the cul-de-sac  covering the vaginal cuff completely.  All instruments were then  removed.  The robot was undocked.  All trocars are removed after  evacuating the pneumoperitoneum.   The fascia of the supraumbilical incision is closed using the previously  placed pursestring suture of  zero Vicryl.  The fascia of the patient  side assistant trocar is closed with a figure-of-eight stitch of zero  Vicryl.  All incisions are then closed with subcuticular suture of 3-0  Monocryl and Dermabond.   The vaginal occluder is removed from the vagina and we proceed with  closure of the episiotomy in the usual fashion using 3-0 Vicryl.   Instruments and sponge count is complete x2.  Estimated blood loss is 50  mL.  The procedure is very well tolerated by the patient who is taken to  the recovery room in a well and stable condition.   The specimen is composed of the uterus and is weighed at 824 grams and  is sent to pathology.      Crist Fat Rivard, M.D.  Electronically Signed     SAR/MEDQ  D:  05/16/2007  T:  05/18/2007  Job:  [id number]

## 2010-10-10 NOTE — H&P (Signed)
NAME:  Cathy Woodward, Cathy Woodward               ACCOUNT NO.:  [id number]   MEDICAL RECORD NO.:  [id number]        PATIENT TYPE:  LAMB   LOCATION:                                FACILITY:  [facility]   PHYSICIAN:  Crist Fat. Rivard, M.D. DATE OF BIRTH:  1961/01/29   DATE OF ADMISSION:  05/16/2007  DATE OF DISCHARGE:                              HISTORY & PHYSICAL   HISTORY OF PRESENT ILLNESS:  Ms. Southgate is a 50 year old single Asian  female gravida 0 who presents for robot-assisted hysterectomy because of  symptomatic uterine fibroids.  For the past 10 years the patient has had  increasingly enlarging fibroids and menorrhagia.  The menorrhagia has  been managed successfully with birth control pills, but the mass effect  of her enlarging uterus has caused other discomforts.  She experiences  pelvic pressure causing the radiation of pain down her right leg and  back, urinary urgency, urinary frequency, and constipation.  She denies  any fever, nausea, vomiting, diarrhea or vaginitis symptoms.  Based on a  histogram along with an ultrasound in October 2002 revealed a uterus  measuring 11 x 5.9 cm along with three fibroids two of which measured  6.2 cm and one 2.5 cm.  In July 2008 the patient's uterus was found on  exam to measure 18-20 weeks' size, and increase in her symptoms was also  noted.  Fibroid management options both medical and surgical were  reviewed with patient.  In consideration of definitive surgical  management the patient chose a course of Lupron Depot 11.25 mg to shrink  the size of her uterus.  A follow-up ultrasound in December 2008  demonstrated a uterus measuring 13.9 cm x 9.3 cm x 12.8 cm along with  two anterior fibroids measuring 6.7 cm and 4.3 cm; a fundal fibroid  measuring 5.6 cm and two posterior fibroids measuring 6.1 cm and 4.9 cm,  respectively.  The patient's ovaries were obscured by the fibroids.  On  exam the patient's uterus had decreased to 14 weeks' size.  Given  the  success of the Lupron Depot therapy the patient has decided to proceed  with a robot-assisted hysterectomy for definitive management of her  symptoms.   OBSTETRIC HISTORY:  Gravida 0.   GYNECOLOGICAL HISTORY:  Menarche 50 years old.  The patient's last  menstrual period February 20, 2007.  Method of contraception:  Abstinence.  The patient denies any history of sexually transmitted  diseases or history of abnormal Pap smears.  Her last normal Pap smear  was May 2007.  Last normal mammogram July 2008.   PAST MEDICAL HISTORY:  TMJ, migraines, mitral valve prolapse with mild  mitral regurgitation (asymptomatic with cardiac evaluation in 2002),  gastroesophageal reflux disease, anemia, acne, scrofula, allergic  rhinitis, eczema, and inflammatory colon polyp.   SURGICAL HISTORY:  1967 left node removal, cervical lymph node removal,  2000 right breast cyst aspiration, 2002 hysteroscopy.  She denies any  history of blood transfusions or problems with anesthesia.   FAMILY HISTORY:  Cardiovascular disease, asthma, cancer (esophageal  breast cancer, colon cancer), irritable bowel syndrome, questionable  rheumatoid  arthritis, and diabetes mellitus.   HABITS:  The patient does not use tobacco.  She rarely consumes alcohol.  Denies any illicit drug use.   SOCIAL HISTORY:  The patient is single, and she works as a Development worker, community.   MEDICATIONS:  1. Fexofenadine 60 mg twice daily.  2. Rhinocort 1 spray per nostril daily.  3. Omeprazole 20 mg daily.  4. Multivitamin one tablet daily.  5. Vitamin E 400 international units daily.  6. Calcium 600 mg with 800 international units of vitamin D daily.   ALLERGIES:  MINOCIN causes dysequilibrium, PREVACID causes a rash,  ASPIRIN vertigo.  (The patient can, however, take ibuprofen and  naproxen).  ERYTHROMYCIN causes GI upset.   REVIEW OF SYSTEMS:  The patient has a history of high frequency hearing  loss, tinnitus, frequent headaches.   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  Denies  any chest pain, shortness of breath, vision changes, and except as is  mentioned in History of Present Illness the patient's Review of Systems  is negative.   PHYSICAL EXAMINATION:  VITAL SIGNS:  Blood pressure 120/70, weight 117,  pulse 64, height 5 feet 7-1/2 inches tall.  NECK:  Supple without masses.  There is no thyromegaly or cervical  adenopathy.  HEART:  Regular rate and rhythm.  LUNGS:  Clear.  BACK:  No CVA tenderness.  ABDOMEN:  No tenderness though there is a mass arising from the pelvis  which is firm at approximately 3 fingerbreadths above the symphysis  pubis.  There is no organomegaly.  EXTREMITIES:  No clubbing, cyanosis or edema.  PELVIC:  EG/BUS is normal.  Vagina is normal.  Uterus appears 14 weeks'  size and is mobile.  Adnexa is not palpable due to mass effect of the  uterus.   IMPRESSION:  Symptomatic uterine fibroids.   DISPOSITION:  A discussion was held with the patient regarding the  indications for her procedure along with its risks which include but are  not limited to reaction to anesthesia, damage to adjacent organs,  infection, excessive bleeding, and the possibility that an open  abdominal incision may be needed to complete her surgery safely.  The  patient further understands that she will experience transient  postoperative facial edema, that her procedure will be longer compared  with traditional open laparotomy, that her hospital stay is expected to  be 1-2 days, and that she is expected to return to her regular  activities within 2-3 weeks.  The patient has consented to proceed with  a robot-assisted hysterectomy at [facility] on  May 16, 2007 at 7:30 a.m.  She was given a MiraLax bowel prep to be  completed 24 hours  prior to her surgical procedure.      Elmira J. Adline Peals.      Crist Fat Rivard, M.D.  Electronically Signed   EJP/MEDQ  D:  05/14/2007  T:  05/14/2007  Job:  [id number]

## 2010-10-13 NOTE — Op Note (Signed)
NAME:  Cathy Woodward, Cathy Woodward               ACCOUNT NO.:  [id number]   MEDICAL RECORD NO.:  [id number]          PATIENT TYPE:  AMB   LOCATION:  ENDO                         FACILITY:  [facility]   PHYSICIAN:  Anselmo Rod, M.D.  DATE OF BIRTH:  [birth year]   DATE OF PROCEDURE:  02/13/2006  DATE OF DISCHARGE:                                 OPERATIVE REPORT   PROCEDURE PERFORMED:  Esophagogastroduodenoscopy with gastric biopsies.   ENDOSCOPIST:  Anselmo Rod, M.D.   INSTRUMENT USED:  Olympus video panendoscope.   INDICATION FOR PROCEDURE:  A 50 year old female with a history of severe  reflux on PPIs.  Rule out Barrett's mucosa, esophagitis, etc.   PREPROCEDURE PREPARATION:  Informed consent was procured from the patient.  The patient was fasted for 4 hours prior to the procedure.  The risks and  benefits of the procedure were discussed with her in great detail.   PREPROCEDURE PHYSICAL:  VITAL SIGNS:  The patient had stable vital signs.  NECK:  Supple.  CHEST:  Clear to auscultation.  S1, S2 regular.  ABDOMEN:  Soft with a palpable mass in the suprapubic area.  The patient has  a known history of fibroids.  No hepatosplenomegaly appreciated.   DESCRIPTION OF PROCEDURE:  The patient was placed in the left lateral  decubitus position and sedated with 60 mcg of fentanyl and 6 mg of Versed  given intravenously in slow incremental doses. Once the patient was  adequately sedate and maintained on low-flow oxygen and continuous cardiac  monitoring, the Olympus video panendoscope was advanced through the  mouthpiece, over the tongue, into the esophagus under direct vision.  The  entire esophagus appeared normal with no evidence of ring, stricture,  masses, esophagitis or Barrett's mucosa.  The scope was then advanced into  the stomach.  No evidence of a hiatal hernia was noted on high retroflexion.  A few gastric polyps were biopsied from the proximal stomach and gastric  biopsies were done from  the antrum as well.  There was evidence of moderate  diffuse gastritis with old heme overlying some of the areas.  The proximal  small bowel appeared normal.  There was no outlet obstruction.  The patient  tolerated the procedure well without complication.   IMPRESSION:  1. Normal-appearing esophagus with no evidence of stricture, esophagitis      or Barrett's mucosa.  2. Moderate diffuse gastritis with few sessile polyps in the proximal      stomach, multiple biopsies done to rule out Helicobacter pylori.  3. Normal proximal small bowel.   RECOMMENDATIONS:  1. Await pathology results.  2. Avoid all nonsteroidals including aspirin for now.  3. Continue with PPIs.  4. Treat with antibiotics if H. pylori present on biopsies.  5. Proceed with a colonoscopy at this time.  Further recommendations made      thereafter.      Anselmo Rod, M.D.  Electronically Signed     JNM/MEDQ  D:  02/13/2006  T:  02/14/2006  Job:  [id number]

## 2010-10-13 NOTE — Op Note (Signed)
NAME:  Cathy Woodward, Cathy Woodward               ACCOUNT NO.:  [id number]   MEDICAL RECORD NO.:  [id number]          PATIENT TYPE:  AMB   LOCATION:  ENDO                         FACILITY:  [facility]   PHYSICIAN:  Anselmo Rod, M.D.  DATE OF BIRTH:  1960/06/15   DATE OF PROCEDURE:  02/13/2006  DATE OF DISCHARGE:                                 OPERATIVE REPORT   PROCEDURE PERFORMED:  Colonoscopy with snare polypectomy x1.   ENDOSCOPIST:  Anselmo Rod, M.D.   INSTRUMENT USED:  Olympus video colonoscope (pediatric adjustable scope).   INDICATIONS FOR PROCEDURE:  A 51 year old female with a history of change in  bowel habits and family history of colon cancer in a paternal aunt who was  diagnosed in her 15s to rule out colonic polyps, masses, etc.   PREPROCEDURE PREPARATION:  Informed consent was procured from the patient.  The patient fasted for 8 hours prior to the procedure after being prepped  with 20 OsmoPrep pills the night of and 12 OsmoPrep pills the morning of the  procedure. The risks and benefits of the procedure including a 10% miss rate  of cancer and polyps was discussed with the patient as well.   PREPROCEDURE PHYSICAL:  VITAL SIGNS:  The patient has stable vital signs.  NECK:  Supple.  CHEST:  Clear to auscultation. S1 and S2 regular.  ABDOMEN:  Soft with normal bowel sounds.   DESCRIPTION OF PROCEDURE:  The patient was placed in a left lateral  decubitus position and sedated with an additional 30 mcg of fentanyl and 1  mg of Versed in slow incremental doses. Once the patient was adequately  sedated and maintained on low-flow oxygen and continuous cardiac monitoring,  the Olympus video colonoscope was advanced from the rectum to the cecum. The  appendiceal orifice and ileocecal valve were clearly visualized and  photographed. The terminal ileum appeared healthy and without lesions. Small  sessile polyp was removed by hot snare from 35 cm. Retroflexion in the  rectum revealed no  abnormalities. The patient tolerated the procedure well  without immediate complications.   IMPRESSION:  Small sessile polyp removed by hot snare from 35 cm. Otherwise  normal colonoscopy of the terminal ileum.   RECOMMENDATIONS:  1. Continue a high-fiber diet with liberal fluid intake.  2. Await pathology results.  3. Repeat colonoscopy depending pathology results.  4. Outpatient followup in the next two weeks or earlier if need be.      Anselmo Rod, M.D.  Electronically Signed     JNM/MEDQ  D:  02/13/2006  T:  02/14/2006  Job:  [id number]

## 2010-10-13 NOTE — Letter (Signed)
September 22, 2007    Isla Pence, M.D.  [address]  [place], [place] [address]   RE:  Cathy Woodward, Cathy Woodward  MRN:  [id number]  /  DOB:  Oct 13, 1960   Dear Cathy Woodward:   I have received a comprehensive report from [facility],  Sherry Cortwright.  This looks like a very comprehensive and full  examination.  Treatment plan seems very reasonable.   I would certainly appreciate your feedback as to the quality of service  that you received.  I do refer a lot of patients to [facility] but you are one of my more informed patients who can really  give me a professional assessment of the services that they offered.  Having this kind of information strongly reinforces in me my referral  pattern and of course, if there are problems I need to know that as  well.   I sure hope that your neck is doing better and that they are able to  help you.  As always, it was great seeing you.   [place]  Please feel free to contact me  at any time.  As always I remain yours truly.    Sincerely,      Rosalyn Gess. Norins, MD  Electronically Signed    MEN/MedQ  DD: 09/22/2007  DT: 09/22/2007  Job #: [id number]

## 2010-10-13 NOTE — Assessment & Plan Note (Signed)
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  She was last seen in the office on Oct 09, 2005.   INTERVAL HISTORY:  The patient did have colonoscopy in 2007 by Dr.  Anselmo Rod.  She was found to have an inflammatory polyp.  No other  abnormalities were noted.   The patient, since her visit on Oct 09, 2005, did have a CT angio  performed Oct 23, 2005.  The CT had a very complex, detailed report.  The bottom line is that the patient did not have any significant right  internal carotid artery stenosis.  The left internal carotid was normal.  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  The proximal cavernous and distal petrous  portion of the internal carotid artery is suboptimally visualized.  Of  note, the patient has done well.  She has had no significant further  episodes of vertigo.  She definitely has an ASPIRIN allergy because she  has re-challenged herself and had recurrent symptoms.  There is still a  question of whether she has a variant of Meniere's disease.   GYN:  The patient continues to follow up with her gynecologist, Dr.  Dierdre Forth, for a very large uterine fibroid.  She has had uterine  hysteroscopy.  At this point, she is contemplating her options.  She  definitely would not want surgery for at least a year.  She wants to see  what happens as she moves through menopause.  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   PAST MEDICAL HISTORY:  Well-documented and summarized in my note of December 18, 2004, with no changes except as noted above.   CURRENT MEDICATIONS:  1. Protonix 40 mg daily.  2. Cathy Woodward 1 spray per nostril daily.  3. Saline nasal wash daily.  4. Cathy Woodward OCP to help with menorrhagia related to her fibroid      uterus.  5. Multivitamins.  6. Calcium.  7. Vitamin C.  8. Vitamin E 400 IU daily.  9. Fexofenadine 60 mg b.i.d.   The patient reports having had a mammogram in 2007 that was normal.  She  has had evaluation by Dr. Pennie Rushing in 2007, and is scheduling an  appointment for 2008.  The patient has had a tetanus booster.   REVIEW OF SYSTEMS:  Negative for any constitutional problems with no  fevers, sweats, or chills.  She has had some minor weight loss since her  last visit, dropping from 122 to 117 pounds.  Estimated BMI at  approximately 20.  The patient has had an eye exam in the last 24  months.  No ENT complaints.  Cardiovascular with rare PVCs exacerbated  with exertion, but basically asymptomatic most of the time.  To does  have a recent cough, which she attributes to allergy.  No GI, GU,  musculoskeletal complaints otherwise.   INTERVAL SOCIAL HISTORY:  The patient continues to work in [employer]  with the last assignment being in [place] in the [place] area.  Her next  assignment beginning next week will be 2 months out in [place].  The  patient's brother remains in [place].  Her mother is doing well and is  actually visiting in [place].   LIMITED EXAM:  Temperature 97.3, blood pressure 99/67, pulse 69, weight  117.  GENERAL APPEARANCE:  This is a very slender woman in no acute distress.  HEENT:  Normocephalic, atraumatic.  EACs and TMs unremarkable.  Oropharynx with native dentition in good repair.  No buccal or palatal  lesions were noted.   Posterior pharynx was clear.  Conjunctivae, sclerae  clear.  Pupils are equal, round, and reactive.  Funduscopic exam  deferred to ophthalmology.  NECK:  Supple without thyromegaly.  NODES:  No lymphadenopathy was noted in the submandibular, cervical,  supraclavicular regions.  CHEST:  With CVA tenderness.  She is moving air well.  No rales,  wheezes, or rhonchi appreciated.  BREASTS:  Deferred to gynecology.  CARDIOVASCULAR:  2+ radial pulse.  No JVD.  No carotid bruits.  She had  a quiet precordium.  She had a regular rate and rhythm with 1 PVC  appreciated.  No murmurs were otherwise noted.  ABDOMEN:  Scaphoid with a very easily palpable large mass in the right  lower quadrant.  Very firm.  Also easily palpable mass much smaller in  the left lower quadrant.  PELVIC:  Deferred to gynecology.  RECTAL:  Deferred to gynecology.  EXTREMITIES:  Without cyanosis, clubbing, or edema.  No deformities were  noted.   DATABASE:  Hemoglobin 13.6 g, white count 4800, MCV just slightly low at  77.1.  The patient did have a mild lymphocytosis at 48.4%.  She has  eosinophilia at 5.7%, consistent with her allergies.  Chemistries were  unremarkable with normal sodium bicarbonate.  Glucose was 88.  Creatinine was 0.6.  Liver functions were normal, except for a slightly  depressed albumin at 3.3.  Cholesterol 223, triglycerides 171, HDL 44,  LDL 156.5.  TSH was normal at 1.58.  Urinalysis was negative.   ASSESSMENT AND PLAN:  1. Gastroesophageal reflux disease.  The patient is doing well with      Protonix.  2. Cardiovascular.  The patient with known mitral valve prolapse.      Last 2D echo from September 05, 2000, which revealed moderate mitral      valve prolapse with mild mitral regurgitation.  It would be      reasonable for the patient to consider a repeat 2D echo at her      convenience given that it has been a 5-year interval since her last      study. 3. Allergic rhinitis.  This is a chronic  problem, exacerbated this      season.  The patient is given a trial of Veramyst to see if this is      more effective  for her.  She is given a refill of prescription on      fexofenadine.  4. Gynecologic.  With discussion as above.  The patient is going to      contemplate her options, but certainly no surgery is contemplated      in the next year.  5. Otolaryngology/neurology.  The patient with a huge workup as noted      in the previous year.  At this point, she is doing well.  She is      following a low sodium diet.  Is not on a diuretic at this time.      There is no significant evidence for any intracranial vascular      disease of concern.  6. Health maintenance.  The patient is current with colorectal cancer      screening.  7. The patient's lipids are mildly elevated.  Previously, her LDL      cholesterol was 129.  Given the very low cardiac risk profile and      based on National Cholesterol Education Program guidelines, would      not recommend any medical interventions for an LDL that is less      than 160.  Furthermore, the patient's weight is very low and I do      not think that she should make any drastic changes in her diet,      although she should make conscientious changes with regard to fat      consumption, but I would not want any further significant weight      loss.   SUMMARY:  The patient seems medically stable at this time with problems  as outlined as above.  It was a delight to see her.  She should return  to see me on an as needed basis or in 1 year.     Rosalyn Gess Norins, MD  Electronically Signed    MEN/MedQ  DD: 09/12/2006  DT: 09/12/2006  Job #: [id number]   cc:   Dellia Nims, M.D.  Hal Morales, M.D.  Anselmo Rod, M.D.

## 2011-01-23 ENCOUNTER — Telehealth: Payer: Self-pay | Admitting: *Deleted

## 2011-01-23 NOTE — Telephone Encounter (Signed)
Amy from staffing company left vm that she faxed over form for MD to complete. Have you seen this?

## 2011-01-24 NOTE — Telephone Encounter (Signed)
Sorry i Cathy Woodward'

## 2011-01-24 NOTE — Telephone Encounter (Signed)
Latoya took care of paperwork.

## 2011-02-02 ENCOUNTER — Other Ambulatory Visit: Payer: Self-pay | Admitting: *Deleted

## 2011-02-02 MED ORDER — FLUTICASONE PROPIONATE 50 MCG/ACT NA SUSP: 2.0000 | Freq: Every day | NASAL | Status: DC

## 2011-02-07 ENCOUNTER — Other Ambulatory Visit: Payer: Self-pay | Admitting: Internal Medicine

## 2011-02-07 DIAGNOSIS — Z1231 Encounter for screening mammogram for malignant neoplasm of breast: Secondary | ICD-10-CM

## 2011-03-02 LAB — CBC
Hemoglobin: 11.4 — ABNORMAL LOW
MCHC: 34.2
MCHC: 34.4
MCV: 77.6 — ABNORMAL LOW
Platelets: 286
RBC: 4.29
RBC: 5.01
WBC: 14.6 — ABNORMAL HIGH

## 2011-03-02 LAB — BASIC METABOLIC PANEL
CO2: 28
Chloride: 106
Creatinine, Ser: 0.77
GFR calc Af Amer: >60
Glucose, Bld: 95

## 2011-03-07 ENCOUNTER — Other Ambulatory Visit: Payer: Self-pay | Admitting: Internal Medicine

## 2011-03-07 ENCOUNTER — Ambulatory Visit
Admission: RE | Admit: 2011-03-07 | Discharge: 2011-03-07 | Disposition: A | Discharge status: Home / Self Care | Payer: BC Managed Care – PPO | Source: Ambulatory Visit | Attending: Internal Medicine | Admitting: Internal Medicine

## 2011-03-07 ENCOUNTER — Other Ambulatory Visit (INDEPENDENT_AMBULATORY_CARE_PROVIDER_SITE_OTHER): Payer: BC Managed Care – PPO

## 2011-03-07 ENCOUNTER — Ambulatory Visit (INDEPENDENT_AMBULATORY_CARE_PROVIDER_SITE_OTHER): Payer: BC Managed Care – PPO | Admitting: Internal Medicine

## 2011-03-07 VITALS — BP 100/68 | HR 60 | Temp 97.5°F | Wt 113.0 lb

## 2011-03-07 DIAGNOSIS — I059 Rheumatic mitral valve disease, unspecified: Secondary | ICD-10-CM

## 2011-03-07 DIAGNOSIS — Z1231 Encounter for screening mammogram for malignant neoplasm of breast: Secondary | ICD-10-CM

## 2011-03-07 DIAGNOSIS — R109 Unspecified abdominal pain: Secondary | ICD-10-CM

## 2011-03-07 DIAGNOSIS — Z23 Encounter for immunization: Secondary | ICD-10-CM

## 2011-03-07 DIAGNOSIS — K219 Gastro-esophageal reflux disease without esophagitis: Secondary | ICD-10-CM

## 2011-03-07 DIAGNOSIS — Z0000 Encounter for general adult medical examination without abnormal findings: Secondary | ICD-10-CM

## 2011-03-07 LAB — LIPID PANEL
Cholesterol: 242 mg/dL — ABNORMAL HIGH (ref 0–200)
HDL: 64.3 mg/dL (ref >39.00)
Total CHOL/HDL Ratio: 4
Triglycerides: 85 mg/dL (ref 0.0–149.0)

## 2011-03-07 LAB — CBC WITH DIFFERENTIAL/PLATELET
Basophils Relative: 0.5 % (ref 0.0–3.0)
Eosinophils Relative: 3.5 % (ref 0.0–5.0)
Lymphocytes Relative: 29.4 % (ref 12.0–46.0)
Monocytes Relative: 7.3 % (ref 3.0–12.0)
Neutrophils Relative %: 59.3 % (ref 43.0–77.0)
Platelets: 254 10*3/uL (ref 150.0–400.0)
RBC: 5.19 Mil/uL — ABNORMAL HIGH (ref 3.87–5.11)
WBC: 8.5 10*3/uL (ref 4.5–10.5)

## 2011-03-07 LAB — HEPATIC FUNCTION PANEL
Albumin: 4.7 g/dL (ref 3.5–5.2)
Total Protein: 8.1 g/dL (ref 6.0–8.3)

## 2011-03-07 LAB — COMPREHENSIVE METABOLIC PANEL
Albumin: 4.7 g/dL (ref 3.5–5.2)
Alkaline Phosphatase: 81 U/L (ref 39–117)
BUN: 14 mg/dL (ref 6–23)
CO2: 29 mEq/L (ref 19–32)
Calcium: 9.6 mg/dL (ref 8.4–10.5)
Chloride: 105 mEq/L (ref 96–112)
GFR: 95.4 mL/min (ref >60.00)
Glucose, Bld: 88 mg/dL (ref 70–99)
Potassium: 3.7 mEq/L (ref 3.5–5.1)

## 2011-03-07 LAB — HEMOGLOBIN A1C: Hgb A1c MFr Bld: 6 % (ref 4.6–6.5)

## 2011-03-07 LAB — TSH: TSH: 1.19 u[IU]/mL (ref 0.35–5.50)

## 2011-03-07 MED ORDER — FEXOFENADINE HCL 60 MG PO TABS: 60.0000 mg | ORAL_TABLET | Freq: Two times a day (BID) | ORAL | Status: DC

## 2011-03-07 MED ORDER — PANTOPRAZOLE SODIUM 40 MG PO TBEC: 40.0000 mg | DELAYED_RELEASE_TABLET | ORAL | Status: DC

## 2011-03-07 MED ORDER — RANITIDINE HCL 150 MG PO TABS: 150.0000 mg | ORAL_TABLET | Freq: Two times a day (BID) | ORAL | Status: DC

## 2011-03-07 MED ORDER — TIZANIDINE HCL 4 MG PO TABS: 4.0000 mg | ORAL_TABLET | Freq: Three times a day (TID) | ORAL | Status: DC

## 2011-03-12 ENCOUNTER — Other Ambulatory Visit (HOSPITAL_COMMUNITY): Payer: BC Managed Care – PPO | Admitting: Radiology

## 2011-04-05 ENCOUNTER — Telehealth: Payer: Self-pay | Admitting: *Deleted

## 2011-04-05 NOTE — Telephone Encounter (Signed)
Received call from pt stating she would like the results of her labs from 03/07/11. Wants to pick up a copy today. Please advise re: result.  Pt requests call when ready for pick up.

## 2011-04-05 NOTE — Telephone Encounter (Signed)
Ok to send labs.

## 2011-04-09 NOTE — Telephone Encounter (Signed)
Labs mailed to pt

## 2011-04-18 ENCOUNTER — Ambulatory Visit (HOSPITAL_COMMUNITY): Payer: BC Managed Care – PPO | Attending: Cardiology | Admitting: Radiology

## 2011-04-18 ENCOUNTER — Other Ambulatory Visit (HOSPITAL_COMMUNITY): Payer: BC Managed Care – PPO | Admitting: Radiology

## 2011-04-18 DIAGNOSIS — I059 Rheumatic mitral valve disease, unspecified: Secondary | ICD-10-CM

## 2011-04-23 NOTE — Progress Notes (Signed)
  Subjective:    Patient ID: Cathy Woodward, female    DOB: [birth year], 50 y.o.   MRN: [id number]  HPI    Review of Systems     Objective:   Physical Exam        Assessment & Plan:

## 2011-04-30 ENCOUNTER — Encounter: Payer: Self-pay | Admitting: Internal Medicine

## 2011-05-07 ENCOUNTER — Other Ambulatory Visit: Payer: Self-pay | Admitting: *Deleted

## 2011-05-07 MED ORDER — FLUTICASONE PROPIONATE 50 MCG/ACT NA SUSP: 2.0000 | Freq: Every day | NASAL | Status: DC

## 2011-05-28 ENCOUNTER — Other Ambulatory Visit: Payer: Self-pay | Admitting: *Deleted

## 2011-05-28 ENCOUNTER — Telehealth: Payer: Self-pay | Admitting: *Deleted

## 2011-05-28 MED ORDER — FLUTICASONE PROPIONATE 50 MCG/ACT NA SUSP: 2.0000 | Freq: Every day | NASAL | Status: DC

## 2011-05-28 MED ORDER — FEXOFENADINE HCL 60 MG PO TABS: 60.0000 mg | ORAL_TABLET | Freq: Two times a day (BID) | ORAL | Status: DC

## 2011-05-28 NOTE — Telephone Encounter (Signed)
Pt reports that [facility] pharmacy needs PA for Allegra Rx. Have you seen this.?

## 2011-05-28 NOTE — Telephone Encounter (Signed)
I have not seen one as of yet.

## 2012-01-08 ENCOUNTER — Telehealth: Payer: Self-pay | Admitting: Internal Medicine

## 2012-01-08 NOTE — Telephone Encounter (Signed)
OK 

## 2012-01-08 NOTE — Telephone Encounter (Signed)
LMOM to call to set up an appt.

## 2012-01-08 NOTE — Telephone Encounter (Signed)
Dr. Baldo Daub would like to come for her physical on Sept 4 or 5.  Would it be ok to schedule her in one of the 10:00 slots?  Her physical last year was in Oct.  She says [employer] will let her do in any month of the year.

## 2012-01-09 NOTE — Telephone Encounter (Signed)
Pt is scheduled for Sept 5 at 10:00.

## 2012-01-29 ENCOUNTER — Other Ambulatory Visit: Payer: Self-pay | Admitting: Internal Medicine

## 2012-01-29 DIAGNOSIS — Z1231 Encounter for screening mammogram for malignant neoplasm of breast: Secondary | ICD-10-CM

## 2012-01-31 ENCOUNTER — Encounter: Payer: Self-pay | Admitting: Internal Medicine

## 2012-01-31 ENCOUNTER — Ambulatory Visit (INDEPENDENT_AMBULATORY_CARE_PROVIDER_SITE_OTHER): Payer: BC Managed Care – PPO | Admitting: Internal Medicine

## 2012-01-31 VITALS — BP 110/68 | HR 69 | Temp 97.9°F | Resp 16 | Wt 116.0 lb

## 2012-01-31 DIAGNOSIS — Z0000 Encounter for general adult medical examination without abnormal findings: Secondary | ICD-10-CM

## 2012-01-31 DIAGNOSIS — Z23 Encounter for immunization: Secondary | ICD-10-CM

## 2012-01-31 DIAGNOSIS — D259 Leiomyoma of uterus, unspecified: Secondary | ICD-10-CM

## 2012-01-31 DIAGNOSIS — I6529 Occlusion and stenosis of unspecified carotid artery: Secondary | ICD-10-CM

## 2012-01-31 DIAGNOSIS — M26609 Unspecified temporomandibular joint disorder, unspecified side: Secondary | ICD-10-CM

## 2012-01-31 DIAGNOSIS — I059 Rheumatic mitral valve disease, unspecified: Secondary | ICD-10-CM

## 2012-01-31 DIAGNOSIS — M5412 Radiculopathy, cervical region: Secondary | ICD-10-CM

## 2012-01-31 DIAGNOSIS — K219 Gastro-esophageal reflux disease without esophagitis: Secondary | ICD-10-CM

## 2012-01-31 DIAGNOSIS — Z131 Encounter for screening for diabetes mellitus: Secondary | ICD-10-CM

## 2012-01-31 MED ORDER — RANITIDINE HCL 150 MG PO TABS: 150.0000 mg | ORAL_TABLET | Freq: Two times a day (BID) | ORAL | Status: DC

## 2012-01-31 MED ORDER — DESOXIMETASONE 0.25 % EX CREA: TOPICAL_CREAM | CUTANEOUS | Status: DC

## 2012-01-31 MED ORDER — FLUTICASONE PROPIONATE 50 MCG/ACT NA SUSP: 2.0000 | Freq: Every day | NASAL | Status: DC

## 2012-01-31 MED ORDER — PANTOPRAZOLE SODIUM 40 MG PO TBEC: 40.0000 mg | DELAYED_RELEASE_TABLET | ORAL | Status: DC

## 2012-01-31 MED ORDER — TIZANIDINE HCL 4 MG PO TABS: 4.0000 mg | ORAL_TABLET | Freq: Three times a day (TID) | ORAL | Status: DC

## 2012-01-31 MED ORDER — TIZANIDINE HCL 4 MG PO TABS: 4.0000 mg | ORAL_TABLET | Freq: Three times a day (TID) | ORAL | Status: AC | PRN

## 2012-01-31 MED ORDER — FEXOFENADINE HCL 60 MG PO TABS: 60.0000 mg | ORAL_TABLET | Freq: Two times a day (BID) | ORAL | Status: DC

## 2012-01-31 NOTE — Progress Notes (Signed)
Subjective:    Patient ID: Cathy Woodward, female    DOB: Oct 06, 1960, 51 y.o.   MRN: [id number]  HPI The patient is here for annual wellness examination and management of other chronic and acute problems. Feeling well and doing well.      The risk factors are reflected in the social history.  The roster of all physicians providing medical care to patient - is listed in the Snapshot section of the chart.  Activities of daily living:  The patient is 100% inedpendent in all ADLs: dressing, toileting, feeding as well as independent mobility  Home safety : The patient has smoke detectors in the home. They wear seatbelts. No firearms at home   There is no risks for hepatitis, STDs or HIV. There is no   history of blood transfusion. They have no recent travel history to infectious disease endemic areas of the world.  The patient has seen their dentist in the last six month. They have seen their eye doctor in the last 2  years. They admit to hearing difficulty-high frequency loss right ear for which she uses an aid and have had audiologic testing in the year.    They do not  have excessive sun exposure. Discussed the need for sun protection: hats, long sleeves and use of sunscreen if there is significant sun exposure.   Diet: the importance of a healthy diet is discussed. They do have a healthy diet.  The patient has no regular exercise program.  The benefits of regular aerobic exercise were discussed.  Depression screen: there are no signs or vegative symptoms of depression- irritability, change in appetite, anhedonia, sadness/tearfullness.  Cognitive assessment: the patient manages all their financial and personal affairs and is actively engaged.  Vision, hearing, body mass index were assessed and reviewed.   Past Medical History  Diagnosis Date  . Temporomandibular joint disorders, unspecified   . Brachial neuritis or radiculitis NOS   . Chickenpox   . Leiomyoma of uterus,  unspecified   . Right-sided sensorineural hearing loss   . Allergic rhinitis   . GERD (gastroesophageal reflux disease)   . MVP (mitral valve prolapse)    Past Surgical History  Procedure Date  . Excision of cervical lymph node anterior cervical chain   . Robotic assisted lap vaginal hysterectomy 2009   Family History  Problem Relation Age of Onset  . Rheumatologic disease Mother   . Heart attack Mother     Anterio septal MI '98  . Thyroid disease Mother     hypo  . Hypertension Mother   . Diabetes Father   . Breast cancer Maternal Aunt   . Colon cancer Other   . Cancer Neg Hx   . Asthma Neg Hx   . Hyperlipidemia Neg Hx    History   Social History  . Marital Status: Single    Spouse Name: N/A    Number of Children: N/A  . Years of Education: 60   Occupational History  . physician-internist     in [employer]   Social History Main Topics  . Smoking status: Never Smoker   . Smokeless tobacco: Never Used  . Alcohol Use: No  . Drug Use: No  . Sexually Active: No   Other Topics Concern  . Not on file   Social History Narrative   [employer] - years 1-2 medical school, [employer] years 3-4 medical school. 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  Traveled to [place] in Sept '11. 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        Review of Systems Constitutional:  Negative for fever, chills, activity change and unexpected weight change, but variable weight.  HEENT:  positve for hearing loss, no ear pain, variable congestion that is environmental, no neck stiffness, positive for postnasal drip. Negative  swallowing problems. Negative for dental complaints.   Eyes: Negative for vision loss or change in visual acuity.  Respiratory: Negative for chest tightness and wheezing. Negative for DOE.   Cardiovascular: Negative for chest pain. Positive for palpitations w/o syncope or near syncope. No decreased exercise  tolerance Gastrointestinal: No change in bowel habit. No bloating or gas. Break-through reflux and indigestion on occasion. Genitourinary: Negative for urgency, frequency, flank pain and difficulty urinating.  Musculoskeletal: Positive for myalgias-right leg, negative back pain, arthralgias and gait problem.  Question of lumbar radicular symptoms. Neurological: Negative for dizziness, tremors, weakness and headaches.  Hematological: Negative for adenopathy.  Psychiatric/Behavioral: Negative for behavioral problems and dysphoric mood.       Objective:   Physical Exam Filed Vitals:   01/31/12 0959  BP: 110/68  Pulse: 69  Temp: 97.9 F (36.6 C)  Resp: 16  BMI 19.3 Wt Readings from Last 3 Encounters:  01/31/12 116 lb (52.617 kg)  03/07/11 113 lb (51.256 kg)  09/27/10 114 lb (51.71 kg)   Gen'l: well nourished, well developed,slender woman in no distress HEENT - [place]/AT, EACs/TMs normal, oropharynx with native dentition in good condition, no buccal or palatal lesions, posterior pharynx clear, mucous membranes moist. C&S clear, PERRLA, fundi - normal Neck - supple, no thyromegaly Nodes- negative submental, cervical, supraclavicular, axillary regions Chest - no deformity, no CVAT Lungs - clear without rales, wheezes. No increased work of breathing Breast - deferred to gyn Cardiovascular - regular rate and rhythm, quiet precordium, no murmurs, rubs or gallops, 2+ radial, DP and PT pulses, no JVD, no carotid bruits Abdomen - BS+ x 4, no HSM, no guarding or rebound or tenderness Pelvic - deferred to gyn Rectal - deferred to gyn Extremities - no clubbing, cyanosis, edema or deformity.  Neuro - A&O x 3, CN II-XII normal, motor strength normal and equal, DTRs 2+ and symmetrical biceps, radial, and patellar tendons. Cerebellar - no tremor, no rigidity, fluid movement and normal gait. Derm - Head, neck, back, abdomen and extremities without suspicious lesions  Labs pending     Assessment &  Plan:

## 2012-02-03 ENCOUNTER — Encounter: Payer: Self-pay | Admitting: Internal Medicine

## 2012-02-03 DIAGNOSIS — I6529 Occlusion and stenosis of unspecified carotid artery: Secondary | ICD-10-CM | POA: Insufficient documentation

## 2012-02-03 DIAGNOSIS — Z0000 Encounter for general adult medical examination without abnormal findings: Secondary | ICD-10-CM | POA: Insufficient documentation

## 2012-02-03 NOTE — Assessment & Plan Note (Signed)
S/p Laproscopic/robotic assisted hysterectomy - Dec 19, '08 Continues to follow with Dr. Estanislado Pandy

## 2012-02-03 NOTE — Assessment & Plan Note (Signed)
Currently w/o significant symptoms that would limit ADLs.

## 2012-02-03 NOTE — Assessment & Plan Note (Signed)
2D echo Nov 21, '12: Study Conclusions  - Left ventricle: The cavity size was normal. Systolic function was vigorous. The estimated ejection fraction was in the range of 65% to 70%. Wall motion was normal; there were no regional wall motion abnormalities. Doppler parameters are consistent with abnormal left ventricular relaxation (grade 1 diastolic dysfunction). - Mitral valve: Mild prolapse, involving the posterior leaflet. Mild regurgitation. - Atrial septum: No defect or patent foramen ovale was identified.  Asymptomatic. With mild prolapse - candidate for SBE prophylaxis.

## 2012-02-03 NOTE — Assessment & Plan Note (Signed)
Interval medical history is benign. Physical exam, sans breast/pelvic, is normal. Labs pending. Current with colorectal and breast cancer screening. Immunizations: current with Tdap (Sept 5, '13). History of elevated total cholesterol and LDL but not at threshold of medical therapy in light of low overall risk for cardiac disease and ATPIII/Framingham risk calculation with 10 year risk for cardiac event = 1% .  In summary - a delightful colleague who is doing well and is medically stable. Developing a regular exercise program, in addition to her walking, is recommended. She will return as needed.

## 2012-02-03 NOTE — Assessment & Plan Note (Signed)
No complaints at this visit of TMJ pain or effects of bruxism.

## 2012-02-03 NOTE — Assessment & Plan Note (Signed)
Continued problem but will controlled with PPI therapy. EGD Dec 19, '12 - normal esophagus, mild gastritis, sessile polyp. [address]) No path report in EPIC  Plan Continue PPI therapy

## 2012-02-03 NOTE — Assessment & Plan Note (Signed)
MRI brain July 26, '06: IMPRESSION:  Normal MRI of the brain, brainstem and CP angle regions with the exception that there is abnormal flow in the right internal carotid artery suggesting a high-grade stenosis lower down in the neck. Further evaluation is suggested.   CT angio brain - 3D reconstruction May 29, '05: IMPRESSION:  1. 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 This may be significant enough to cause relative decrease in the caliber of the right internal carotid artery in the neck.  2. The proximal cavernous and the distal petrous portion of the internal carotid artery is suboptimally visualized secondary to bony overlay.  CT angio neck May 29, '05: Normal external segment right ICA

## 2012-02-05 ENCOUNTER — Other Ambulatory Visit (INDEPENDENT_AMBULATORY_CARE_PROVIDER_SITE_OTHER): Payer: BC Managed Care – PPO

## 2012-02-05 DIAGNOSIS — Z131 Encounter for screening for diabetes mellitus: Secondary | ICD-10-CM

## 2012-02-05 DIAGNOSIS — Z0000 Encounter for general adult medical examination without abnormal findings: Secondary | ICD-10-CM

## 2012-02-05 DIAGNOSIS — Z13220 Encounter for screening for lipoid disorders: Secondary | ICD-10-CM

## 2012-02-05 LAB — CBC WITH DIFFERENTIAL/PLATELET
Basophils Relative: 0.6 % (ref 0.0–3.0)
Eosinophils Absolute: 0.4 10*3/uL (ref 0.0–0.7)
Eosinophils Relative: 6.1 % — ABNORMAL HIGH (ref 0.0–5.0)
Lymphocytes Relative: 26.1 % (ref 12.0–46.0)
MCHC: 32.4 g/dL (ref 30.0–36.0)
MCV: 79.1 fl (ref 78.0–100.0)
Monocytes Absolute: 0.7 10*3/uL (ref 0.1–1.0)
Neutrophils Relative %: 57.8 % (ref 43.0–77.0)
Platelets: 218 10*3/uL (ref 150.0–400.0)
RBC: 5.14 Mil/uL — ABNORMAL HIGH (ref 3.87–5.11)
WBC: 7.2 10*3/uL (ref 4.5–10.5)

## 2012-02-05 LAB — COMPREHENSIVE METABOLIC PANEL
Alkaline Phosphatase: 77 U/L (ref 39–117)
BUN: 14 mg/dL (ref 6–23)
Glucose, Bld: 83 mg/dL (ref 70–99)
Total Bilirubin: 0.2 mg/dL — ABNORMAL LOW (ref 0.3–1.2)

## 2012-02-05 LAB — LIPID PANEL
Cholesterol: 254 mg/dL — ABNORMAL HIGH (ref 0–200)
HDL: 53.4 mg/dL (ref >39.00)
Triglycerides: 161 mg/dL — ABNORMAL HIGH (ref 0.0–149.0)

## 2012-02-05 LAB — URINALYSIS, ROUTINE W REFLEX MICROSCOPIC
Nitrite: NEGATIVE
Specific Gravity, Urine: 1.025 (ref 1.000–1.030)
pH: 6 (ref 5.0–8.0)

## 2012-02-05 LAB — TSH: TSH: 2.04 u[IU]/mL (ref 0.35–5.50)

## 2012-02-05 LAB — LDL CHOLESTEROL, DIRECT: Direct LDL: 180.3 mg/dL

## 2012-02-08 ENCOUNTER — Encounter: Payer: Self-pay | Admitting: Internal Medicine

## 2012-03-03 ENCOUNTER — Telehealth: Payer: Self-pay | Admitting: Internal Medicine

## 2012-03-03 NOTE — Telephone Encounter (Signed)
The patient called the triage line and is hoping to get a call back to discuss the note that was sent to her house about her lab work.  Home- [phone], cell - [phone].    Thanks!

## 2012-03-03 NOTE — Telephone Encounter (Signed)
Left message on home and cell # of returning patient call.

## 2012-03-04 NOTE — Telephone Encounter (Signed)
LEFT MESSAGE ON HOME #VOICE MAIL OF RETURNING CALL REGARDING LETTER SENT ABOUT LAB WORK.

## 2012-04-11 ENCOUNTER — Other Ambulatory Visit: Payer: Self-pay | Admitting: Internal Medicine

## 2012-04-11 ENCOUNTER — Other Ambulatory Visit: Payer: Self-pay | Admitting: *Deleted

## 2012-04-11 MED ORDER — RANITIDINE HCL 150 MG PO TABS: 150.0000 mg | ORAL_TABLET | Freq: Two times a day (BID) | ORAL | Status: DC

## 2012-05-14 ENCOUNTER — Ambulatory Visit: Payer: Self-pay | Admitting: Obstetrics and Gynecology

## 2012-05-14 ENCOUNTER — Ambulatory Visit
Admission: RE | Admit: 2012-05-14 | Discharge: 2012-05-14 | Disposition: A | Discharge status: Home / Self Care | Payer: BC Managed Care – PPO | Source: Ambulatory Visit | Attending: Internal Medicine | Admitting: Internal Medicine

## 2012-05-14 ENCOUNTER — Other Ambulatory Visit: Payer: Self-pay | Admitting: Internal Medicine

## 2012-05-14 DIAGNOSIS — Z1231 Encounter for screening mammogram for malignant neoplasm of breast: Secondary | ICD-10-CM

## 2012-05-15 ENCOUNTER — Ambulatory Visit: Payer: BC Managed Care – PPO

## 2012-05-19 ENCOUNTER — Ambulatory Visit: Payer: BC Managed Care – PPO

## 2012-05-19 ENCOUNTER — Ambulatory Visit
Admission: RE | Admit: 2012-05-19 | Discharge: 2012-05-19 | Disposition: A | Discharge status: Home / Self Care | Payer: BC Managed Care – PPO | Source: Ambulatory Visit | Attending: Internal Medicine | Admitting: Internal Medicine

## 2012-05-19 DIAGNOSIS — Z1231 Encounter for screening mammogram for malignant neoplasm of breast: Secondary | ICD-10-CM

## 2012-05-20 ENCOUNTER — Encounter: Payer: Self-pay | Admitting: Obstetrics and Gynecology

## 2012-05-20 ENCOUNTER — Ambulatory Visit (INDEPENDENT_AMBULATORY_CARE_PROVIDER_SITE_OTHER): Payer: BC Managed Care – PPO | Admitting: Obstetrics and Gynecology

## 2012-05-20 VITALS — BP 102/60 | Ht 67.75 in | Wt 111.0 lb

## 2012-05-20 DIAGNOSIS — Z01419 Encounter for gynecological examination (general) (routine) without abnormal findings: Secondary | ICD-10-CM

## 2012-05-20 DIAGNOSIS — Z124 Encounter for screening for malignant neoplasm of cervix: Secondary | ICD-10-CM

## 2012-05-20 NOTE — Progress Notes (Signed)
The patient reports:no complaints  Contraception:none  Last mammogram: 05/18/2012 Last pap: 05/18/2009 Normal Colonoscopy: 2012 normal  next 2017  GC/Chlamydia cultures offered: declined HIV/RPR/HbsAg offered:  declined HSV 1 and 2 glycoprotein offered: declined  Menstrual cycle regular and monthly:  Hysterectomy  Menstrual flow normal: No:   Urinary symptoms: sometimes like burning  Normal bowel movements: Yes Reports abuse at home: No:   Subjective:    Cathy Woodward is a 51 y.o. female, G0P0, who presents for an annual exam.     History   Social History  . Marital Status: Single    Spouse Name: N/A    Number of Children: N/A  . Years of Education: 38   Occupational History  . physician-internist     in [employer]   Social History Main Topics  . Smoking status: Never Smoker   . Smokeless tobacco: Never Used  . Alcohol Use: No  . Drug Use: No  . Sexually Active: No   Other Topics Concern  . None   Social History Narrative   [employer] - years 1-2 medical school, [employer] years 3-4 medical school. 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 Traveled to [place] in Sept '11. 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    Menstrual cycle:   LMP: No LMP recorded. Patient has had a hysterectomy.           Cycle: none  The following portions of the patient's history were reviewed and updated as appropriate: allergies, current medications, past family history, past medical history, past social history, past surgical history and problem list.  Review of Systems Pertinent items are noted in HPI. Breast:Negative for breast lump,nipple discharge or nipple retraction Gastrointestinal: Negative for abdominal pain, change in bowel habits or rectal bleeding Urinary:negative   Objective:    BP 102/60  Ht 5' 7.75" (1.721 m)  Wt 111 lb (50.349 kg)  BMI 17.00 kg/m2    Weight:  Wt Readings from Last 1 Encounters:  05/20/12 111 lb  (50.349 kg)          BMI: Body mass index is 17.00 kg/(m^2).  General Appearance: Alert, appropriate appearance for age. No acute distress HEENT: Grossly normal Neck / Thyroid: Supple, no masses, nodes or enlargement Lungs: clear to auscultation bilaterally Back: No CVA tenderness Breast Exam: No masses or nodes.No dimpling, nipple retraction or discharge. Cardiovascular: Regular rate and rhythm. S1, S2, no murmur Gastrointestinal: Soft, non-tender, no masses or organomegaly Pelvic Exam: Vulva and vagina appear normal. Bimanual exam reveals normal adnexa. Uterus surgically absent Rectovaginal: normal rectal, no masses Lymphatic Exam: Non-palpable nodes in neck, clavicular, axillary, or inguinal regions Skin: no rash or abnormalities Neurologic: Normal gait and speech, no tremor  Psychiatric: Alert and oriented, appropriate affect.     Assessment:    Normal gyn exam    Plan:    mammogram return annually or prn STD screening: declined Menopausal Symptoms discussed   Silverio Lay MD
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
## 2012-05-22 NOTE — Progress Notes (Signed)
Quick Note:  Please send "Dense breast" letter to patient and document in chart when letter is sent. ______ 

## 2012-05-26 ENCOUNTER — Encounter: Payer: Self-pay | Admitting: Obstetrics and Gynecology

## 2012-08-25 ENCOUNTER — Other Ambulatory Visit: Payer: Self-pay | Admitting: Internal Medicine

## 2012-12-15 ENCOUNTER — Encounter: Payer: Self-pay | Admitting: Internal Medicine

## 2012-12-15 ENCOUNTER — Ambulatory Visit (INDEPENDENT_AMBULATORY_CARE_PROVIDER_SITE_OTHER): Payer: BC Managed Care – PPO | Admitting: Internal Medicine

## 2012-12-15 ENCOUNTER — Other Ambulatory Visit (INDEPENDENT_AMBULATORY_CARE_PROVIDER_SITE_OTHER): Payer: BC Managed Care – PPO

## 2012-12-15 VITALS — BP 102/72 | HR 62 | Temp 97.6°F | Ht 67.75 in | Wt 106.0 lb

## 2012-12-15 DIAGNOSIS — I6521 Occlusion and stenosis of right carotid artery: Secondary | ICD-10-CM

## 2012-12-15 DIAGNOSIS — K219 Gastro-esophageal reflux disease without esophagitis: Secondary | ICD-10-CM

## 2012-12-15 DIAGNOSIS — I6529 Occlusion and stenosis of unspecified carotid artery: Secondary | ICD-10-CM

## 2012-12-15 DIAGNOSIS — Z0000 Encounter for general adult medical examination without abnormal findings: Secondary | ICD-10-CM

## 2012-12-15 DIAGNOSIS — J309 Allergic rhinitis, unspecified: Secondary | ICD-10-CM

## 2012-12-15 DIAGNOSIS — M5412 Radiculopathy, cervical region: Secondary | ICD-10-CM

## 2012-12-15 DIAGNOSIS — I059 Rheumatic mitral valve disease, unspecified: Secondary | ICD-10-CM

## 2012-12-15 LAB — LIPID PANEL: Total CHOL/HDL Ratio: 4

## 2012-12-15 LAB — COMPREHENSIVE METABOLIC PANEL
AST: 19 U/L (ref 0–37)
Albumin: 4.2 g/dL (ref 3.5–5.2)
Alkaline Phosphatase: 67 U/L (ref 39–117)
Glucose, Bld: 77 mg/dL (ref 70–99)
Potassium: 3.6 mEq/L (ref 3.5–5.1)
Sodium: 140 mEq/L (ref 135–145)
Total Protein: 7.6 g/dL (ref 6.0–8.3)

## 2012-12-15 LAB — URINALYSIS, ROUTINE W REFLEX MICROSCOPIC
Leukocytes, UA: NEGATIVE
Nitrite: NEGATIVE
RBC / HPF: NONE SEEN (ref 0–?)
Specific Gravity, Urine: >=1.03 (ref 1.000–1.030)
pH: 5.5 (ref 5.0–8.0)

## 2012-12-15 LAB — HEPATIC FUNCTION PANEL
ALT: 17 U/L (ref 0–35)
Total Bilirubin: 0.6 mg/dL (ref 0.3–1.2)
Total Protein: 7.6 g/dL (ref 6.0–8.3)

## 2012-12-15 LAB — CBC WITH DIFFERENTIAL/PLATELET
Basophils Absolute: 0.1 10*3/uL (ref 0.0–0.1)
Eosinophils Absolute: 0.7 10*3/uL (ref 0.0–0.7)
Lymphocytes Relative: 33.9 % (ref 12.0–46.0)
MCHC: 33.1 g/dL (ref 30.0–36.0)
Neutro Abs: 3.1 10*3/uL (ref 1.4–7.7)
Neutrophils Relative %: 47.8 % (ref 43.0–77.0)
RDW: 13.8 % (ref 11.5–14.6)

## 2012-12-15 LAB — LDL CHOLESTEROL, DIRECT: Direct LDL: 159.2 mg/dL

## 2012-12-15 MED ORDER — PANTOPRAZOLE SODIUM 40 MG PO TBEC: 40.0000 mg | DELAYED_RELEASE_TABLET | ORAL | Status: DC

## 2012-12-15 MED ORDER — FEXOFENADINE HCL 60 MG PO TABS: 60.0000 mg | ORAL_TABLET | Freq: Two times a day (BID) | ORAL | Status: DC

## 2012-12-15 MED ORDER — FLUTICASONE PROPIONATE 50 MCG/ACT NA SUSP: 2.0000 | Freq: Every day | NASAL | Status: AC

## 2012-12-15 MED ORDER — RANITIDINE HCL 150 MG PO TABS: 150.0000 mg | ORAL_TABLET | Freq: Two times a day (BID) | ORAL | Status: DC

## 2012-12-15 NOTE — Assessment & Plan Note (Signed)
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 No symptoms reported except for audible pulse right ear.  Plan  will ask neuro-radiologist to review studies in regard to this question.  Consider traditional angiography if review of studies in ambiguous.

## 2012-12-15 NOTE — Patient Instructions (Addendum)
Thanks for coming to see me and for letting me bend your ear.  Your exam is normal except that you are too thin. Please consider focusing on this as the bigger threat to your health than the metabolic parameters.  You seem to be current and up to date with all prevention.   Your lab results will be available on MyChart.   Enjoy your trip home and be safe. Consider establishing regular exercise program in addition to "fast walking."

## 2012-12-15 NOTE — Assessment & Plan Note (Signed)
Continued intermittent minor paresthesia, also left now. No cervical spine studies. No limitation in activity  Plan Lab: B12  For progressive discomfort or dysesthesia will consider cervical imaging.

## 2012-12-15 NOTE — Assessment & Plan Note (Signed)
Last 2 D echo Nov '12:  Mitral valve: Mild prolapse, involving the posterior leaflet. Mild regurgitation. Prominent murmur at the apex.  Plan No repeat Echo at this time.  W/o moderate to severe regurgitation no indication for prophylaxis for dental work.

## 2012-12-15 NOTE — Assessment & Plan Note (Signed)
Last EGD Dec '12. Symptoms are well controlled with PPI therapy qOD and daily H2 blocker therapy.  Plan Continue present regimen

## 2012-12-15 NOTE — Assessment & Plan Note (Addendum)
Interval history is notable for continued weight loss - BMI 16! Diet conscious due to elevated lipids and previous A1C elevation. Limited physical exam normal. Lab - great improvement in lipid panel with LDL 180 to 157, HDL 53.4 to 59.5, 10 year risk per NCEP/Framingham = 1%. No indication for medical therapy.  A1C 6.1 % stable from last labs. Remainder of results normal including B12 of 810, U/A negative for blood or protein. Current re: colorectal and breast cancer screening. Current with Gyn.Immunization - up to date with Tetanus and by her report screened and negative for TB.   In summary A delightful colleague who appears to be in good health: modest elevation in lipids and A1C which do not require intervention and low BMI which does need attention, i.e. Increased calorie intake. She will return as needed orin 1 year for annual exam.

## 2012-12-15 NOTE — Progress Notes (Signed)
Subjective:    Patient ID: Cathy Woodward, female    DOB: [birth year], 52 y.o.   MRN: [id number]  HPI The patient is here for annual wellness examination and management of other chronic and acute problems.  In the interval:P no major illness, no surgery, no injury. Weight management is an issue - BMI is 16.   Has Gyn appointment up-coming. Up to date with ophthalmology and dental. Up to date with mammography.  The risk factors are reflected in the social history.  The roster of all physicians providing medical care to patient - is listed in the Snapshot section of the chart.  Activities of daily living:  The patient is 100% inedpendent in all ADLs: dressing, toileting, feeding as well as independent mobility  Home safety : The patient has smoke detectors in the home. Falls - none. They wear seatbelts. No firearms at home. There is no violence in the home.   There is no risks for hepatitis, STDs or HIV. There is no history of blood transfusion. They have no travel history to infectious disease endemic areas of the world.  The patient has seen their dentist in the last six month. They have seen their eye doctor in the last year. They deny any hearing difficulty and have not had audiologic testing in the last year.    They do not  have excessive sun exposure. Discussed the need for sun protection: hats, long sleeves and use of sunscreen if there is significant sun exposure.   Diet: the importance of a healthy diet is discussed. They do have a healthy diet.  The patient has no regular exercise program.  The benefits of regular aerobic exercise were discussed.  Depression screen: there are no signs or vegative symptoms of depression- irritability, change in appetite, anhedonia, sadness/tearfullness.  Cognitive assessment: the patient manages all their financial and personal affairs and is actively engaged.  The following portions of the patient's history were reviewed and updated as  appropriate: allergies, current medications, past family history, past medical history,  past surgical history, past social history  and problem list.  Vision, hearing, body mass index were assessed and reviewed.   Past Medical History  Diagnosis Date  . Temporomandibular joint disorders, unspecified   . Chickenpox   . Leiomyoma of uterus, unspecified   . Right-sided sensorineural hearing loss   . Allergic rhinitis   . GERD (gastroesophageal reflux disease)   . MVP (mitral valve prolapse)   . Brachial neuritis or radiculitis NOS   . Hx of migraines    Past Surgical History  Procedure Laterality Date  . Excision of cervical lymph node anterior cervical chain    . Robotic assisted lap vaginal hysterectomy  2009   Family History  Problem Relation Age of Onset  . Rheumatologic disease Mother   . Heart attack Mother     Anterio septal MI '98  . Thyroid disease Mother     hypo  . Hypertension Mother   . Diabetes Father   . Breast cancer Maternal Aunt   . Colon cancer Other   . Cancer Neg Hx   . Asthma Neg Hx   . Hyperlipidemia Neg Hx    History   Social History  . Marital Status: Single    Spouse Name: N/A    Number of Children: N/A  . Years of Education: 39   Occupational History  . physician-internist     [employer]   Social History Main Topics  . Smoking status:  Never Smoker   . Smokeless tobacco: Never Used  . Alcohol Use: Yes     Comment: occ  . Drug Use: No  . Sexually Active: No   Other Topics Concern  . Not on file   Social History Narrative   [employer] - years 1-2 medical school, [employer] years 3-4 medical school. 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 Traveled to [place] in Sept '11. 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    Current Outpatient Prescriptions on File Prior to Visit  Medication Sig Dispense Refill  . Ascorbic Acid (VITAMIN C) 500 MG tablet Take 500 mg by mouth daily.        Marland Kitchen  desoximetasone (TOPICORT) 0.25 % cream Apply topically as directed.  30 g  3  . fexofenadine (ALLEGRA) 60 MG tablet TAKE ONE TABLET BY MOUTH TWICE DAILY  60 tablet  4  . fluticasone (FLONASE) 50 MCG/ACT nasal spray Place 2 sprays into the nose daily.  48 g  3  . Multiple Vitamin (MULTIVITAMIN) tablet Take 1 tablet by mouth daily.      . pantoprazole (PROTONIX) 40 MG tablet Take 1 tablet (40 mg total) by mouth every morning.  90 tablet  3  . ranitidine (ZANTAC) 150 MG tablet Take 1 tablet (150 mg total) by mouth 2 (two) times daily.  180 tablet  2  . tiZANidine (ZANAFLEX) 4 MG tablet Take 1 tablet (4 mg total) by mouth every 8 (eight) hours as needed.  90 tablet  5  . vitamin E 400 UNIT capsule Take 400 Units by mouth daily.         No current facility-administered medications on file prior to visit.       Review of Systems Constitutional:  Negative for fever, chills, activity change and unexpected weight change.  HEENT:  Negative for hearing loss, ear pain, congestion, neck stiffness and postnasal drip. Negative for sore throat or swallowing problems. Negative for dental complaints.   Eyes: Negative for vision loss or change in visual acuity.  Respiratory: Negative for chest tightness and wheezing. Negative for DOE.   Cardiovascular: Negative for chest pain or palpitations. No decreased exercise tolerance Gastrointestinal: No change in bowel habit. No bloating or gas. No reflux or indigestion Genitourinary: Negative for urgency, frequency, flank pain and difficulty urinating.  Musculoskeletal: Negative for myalgias, back pain, arthralgias and gait problem.  Neurological: Negative for dizziness, tremors, weakness and headaches. Occasional paresthesia. Hematological: Negative for adenopathy.  Psychiatric/Behavioral: Negative for behavioral problems and dysphoric mood.       Objective:   Physical Exam Filed Vitals:   12/15/12 0902  BP: 102/72  Pulse: 62  Temp: 97.6 F (36.4 C)    Wt Readings from Last 3 Encounters:  12/15/12 106 lb (48.081 kg)  05/20/12 111 lb (50.349 kg)  01/31/12 116 lb (52.617 kg)   Gen'l: well nourished, well developed but thin Woman in no distress HEENT - /AT, EACs/TMs normal, oropharynx with native dentition in good condition, no buccal or palatal lesions, posterior pharynx clear, mucous membranes moist. C&S clear, PERRLA, fundi - normal Neck - supple, no thyromegaly Nodes- negative submental, cervical, supraclavicular regions Chest - no deformity, no CVAT Lungs - clear without rales, wheezes. No increased work of breathing Breast - deferred to gyn and mammography Cardiovascular - regular rate and rhythm, quiet precordium, no murmurs, rubs or gallops, 2+ radial, DP pulses Abdomen - BS+ x 4, no  HSM, no guarding or rebound or tenderness Pelvic - deferred to gyn Rectal - deferred to GI Extremities - no clubbing, cyanosis, edema or deformity.  Neuro - A&O x 3, CN II-XII normal, motor strength normal and equal, DTRs 2+ and symmetrical biceps, radial, and patellar tendons. Cerebellar - no tremor, no rigidity, fluid movement and normal gait. Derm - Head, neck, back, abdomen and extremities without suspicious lesions  Recent Results (from the past 2160 hour(s))  HEMOGLOBIN A1C     Status: None   Collection Time    12/15/12 10:15 AM      Result Value Range   Hemoglobin A1C 6.1  4.6 - 6.5 %   Comment: Glycemic Control Guidelines for People with Diabetes:Non Diabetic:  <6%Goal of Therapy: <7%Additional Action Suggested:  >8%   HEPATIC FUNCTION PANEL     Status: None   Collection Time    12/15/12 10:15 AM      Result Value Range   Total Bilirubin 0.6  0.3 - 1.2 mg/dL   Bilirubin, Direct 0.1  0.0 - 0.3 mg/dL   Alkaline Phosphatase 67  39 - 117 U/L   AST 19  0 - 37 U/L   ALT 17  0 - 35 U/L   Total Protein 7.6  6.0 - 8.3 g/dL   Albumin 4.2  3.5 - 5.2 g/dL  COMPREHENSIVE METABOLIC PANEL     Status: None   Collection Time    12/15/12 10:15  AM      Result Value Range   Sodium 140  135 - 145 mEq/L   Potassium 3.6  3.5 - 5.1 mEq/L   Chloride 105  96 - 112 mEq/L   CO2 30  19 - 32 mEq/L   Glucose, Bld 77  70 - 99 mg/dL   BUN 15  6 - 23 mg/dL   Creatinine, Ser 0.6  0.4 - 1.2 mg/dL   Total Bilirubin 0.6  0.3 - 1.2 mg/dL   Alkaline Phosphatase 67  39 - 117 U/L   AST 19  0 - 37 U/L   ALT 17  0 - 35 U/L   Total Protein 7.6  6.0 - 8.3 g/dL   Albumin 4.2  3.5 - 5.2 g/dL   Calcium 9.8  8.4 - 45.4 mg/dL   GFR 098.11  >91.47 mL/min  LIPID PANEL     Status: Abnormal   Collection Time    12/15/12 10:15 AM      Result Value Range   Cholesterol 226 (*) 0 - 200 mg/dL   Comment: ATP III Classification       Desirable:  < 200 mg/dL               Borderline High:  200 - 239 mg/dL          High:  > = 829 mg/dL   Triglycerides 56.2  0.0 - 149.0 mg/dL   Comment: Normal:  <130 mg/dLBorderline High:  150 - 199 mg/dL   HDL 86.57  >84.69 mg/dL   VLDL 62.9  0.0 - 52.8 mg/dL   Total CHOL/HDL Ratio 4     Comment:                Men          Women1/2 Average Risk     3.4          3.3Average Risk          5.0          4.42X  Average Risk          9.6          7.13X Average Risk          15.0          11.0                      URINALYSIS, ROUTINE W REFLEX MICROSCOPIC     Status: None   Collection Time    12/15/12 10:15 AM      Result Value Range   Color, Urine LT. YELLOW  Yellow;Lt. Yellow   APPearance CLEAR  Clear   Specific Gravity, Urine >=1.030  1.000 - 1.030   pH 5.5  5.0 - 8.0   Total Protein, Urine NEGATIVE  Negative   Urine Glucose NEGATIVE  Negative   Ketones, ur NEGATIVE  Negative   Bilirubin Urine NEGATIVE  Negative   Hgb urine dipstick NEGATIVE  Negative   Urobilinogen, UA 0.2  0.0 - 1.0   Leukocytes, UA NEGATIVE  Negative   Nitrite NEGATIVE  Negative   WBC, UA 0-2/hpf  0-2/hpf   RBC / HPF none seen  0-2/hpf   Mucus, UA Presence of  None   Squamous Epithelial / LPF Rare(0-4/hpf)  Rare(0-4/hpf)  CBC WITH DIFFERENTIAL     Status:  Abnormal   Collection Time    12/15/12 10:15 AM      Result Value Range   WBC 6.5  4.5 - 10.5 K/uL   RBC 4.98  3.87 - 5.11 Mil/uL   Hemoglobin 13.2  12.0 - 15.0 g/dL   HCT 16.1  09.6 - 04.5 %   MCV 80.1  78.0 - 100.0 fl   MCHC 33.1  30.0 - 36.0 g/dL   RDW 40.9  81.1 - 91.4 %   Platelets 229.0  150.0 - 400.0 K/uL   Neutrophils Relative % 47.8  43.0 - 77.0 %   Lymphocytes Relative 33.9  12.0 - 46.0 %   Monocytes Relative 7.0  3.0 - 12.0 %   Eosinophils Relative 10.3 (*) 0.0 - 5.0 %   Basophils Relative 1.0  0.0 - 3.0 %   Neutro Abs 3.1  1.4 - 7.7 K/uL   Lymphs Abs 2.2  0.7 - 4.0 K/uL   Monocytes Absolute 0.5  0.1 - 1.0 K/uL   Eosinophils Absolute 0.7  0.0 - 0.7 K/uL   Basophils Absolute 0.1  0.0 - 0.1 K/uL  VITAMIN B12     Status: None   Collection Time    12/15/12 10:15 AM      Result Value Range   Vitamin B-12 810  211 - 911 pg/mL  LDL CHOLESTEROL, DIRECT     Status: None   Collection Time    12/15/12 10:15 AM      Result Value Range   Direct LDL 159.2     Comment: Optimal:  <100 mg/dLNear or Above Optimal:  100-129 mg/dLBorderline High:  130-159 mg/dLHigh:  160-189 mg/dLVery High:  >190 mg/dL         Assessment & Plan:

## 2012-12-15 NOTE — Assessment & Plan Note (Signed)
chronic condition which will wax and wane but is generally well controlled  Plan continue present regimen

## 2012-12-16 ENCOUNTER — Other Ambulatory Visit: Payer: Self-pay | Admitting: Internal Medicine

## 2012-12-16 ENCOUNTER — Other Ambulatory Visit: Payer: Self-pay

## 2012-12-16 MED ORDER — DESOXIMETASONE 0.25 % EX CREA: TOPICAL_CREAM | CUTANEOUS | Status: DC

## 2012-12-17 ENCOUNTER — Encounter: Payer: Self-pay | Admitting: Internal Medicine

## 2012-12-29 ENCOUNTER — Encounter: Payer: Self-pay | Admitting: Internal Medicine

## 2012-12-30 ENCOUNTER — Other Ambulatory Visit: Payer: Self-pay | Admitting: Internal Medicine

## 2012-12-30 DIAGNOSIS — I999 Unspecified disorder of circulatory system: Secondary | ICD-10-CM

## 2013-01-01 ENCOUNTER — Ambulatory Visit (INDEPENDENT_AMBULATORY_CARE_PROVIDER_SITE_OTHER)
Admission: RE | Admit: 2013-01-01 | Discharge: 2013-01-01 | Disposition: A | Discharge status: Home / Self Care | Payer: BC Managed Care – PPO | Source: Ambulatory Visit | Attending: Internal Medicine | Admitting: Internal Medicine

## 2013-01-01 DIAGNOSIS — I999 Unspecified disorder of circulatory system: Secondary | ICD-10-CM

## 2013-01-01 MED ORDER — IOHEXOL 350 MG/ML SOLN
80.0000 mL | Freq: Once | INTRAVENOUS | Status: AC | PRN
  Administered 2013-01-01: 80 mL via INTRAVENOUS

## 2013-01-02 ENCOUNTER — Encounter: Payer: Self-pay | Admitting: Internal Medicine

## 2013-02-12 ENCOUNTER — Telehealth: Payer: Self-pay | Admitting: Internal Medicine

## 2013-02-12 NOTE — Telephone Encounter (Signed)
Cathy Woodward called wanting to know if she could get a letter of good health signed by Dr. Debby Bud.  Cathy Woodward called after hours.  She said she does have a authorization release letter.  I told her I was not sure how this would be handled, that Cathy Woodward may need to come request the letter.  Can you please give her a call in regards.

## 2013-02-13 ENCOUNTER — Other Ambulatory Visit: Payer: Self-pay | Admitting: Internal Medicine

## 2013-02-13 NOTE — Telephone Encounter (Signed)
Letter done

## 2013-02-13 NOTE — Telephone Encounter (Signed)
Cathy Woodward called back and stated letter should be faxed to [phone]

## 2013-02-13 NOTE — Telephone Encounter (Signed)
Left message with Burr Medico to see where this letter should be faxed.

## 2013-02-15 ENCOUNTER — Encounter: Payer: Self-pay | Admitting: Internal Medicine

## 2013-04-02 ENCOUNTER — Other Ambulatory Visit: Payer: Self-pay

## 2013-04-15 ENCOUNTER — Other Ambulatory Visit: Payer: Self-pay

## 2013-04-15 DIAGNOSIS — Z1231 Encounter for screening mammogram for malignant neoplasm of breast: Secondary | ICD-10-CM

## 2013-06-03 ENCOUNTER — Encounter: Payer: Self-pay | Admitting: Internal Medicine

## 2013-06-04 ENCOUNTER — Ambulatory Visit
Admission: RE | Admit: 2013-06-04 | Discharge: 2013-06-04 | Disposition: A | Discharge status: Home / Self Care | Payer: No Typology Code available for payment source | Source: Ambulatory Visit

## 2013-06-04 DIAGNOSIS — Z1231 Encounter for screening mammogram for malignant neoplasm of breast: Secondary | ICD-10-CM

## 2013-06-09 ENCOUNTER — Other Ambulatory Visit: Payer: Self-pay | Admitting: Internal Medicine

## 2013-06-18 ENCOUNTER — Telehealth: Payer: Self-pay | Admitting: *Deleted

## 2013-06-18 NOTE — Telephone Encounter (Signed)
Left msg on triage needing assistant with the mycahrt she has been deactivated dur to putting in wrong password. Calledpt back gave her the # to [facility] support @ [phone]...Johny Chess

## 2013-08-27 NOTE — Telephone Encounter (Signed)
This encounter was created in error - please disregard.

## 2014-02-19 ENCOUNTER — Other Ambulatory Visit: Payer: Self-pay | Admitting: Geriatric Medicine

## 2014-02-19 MED ORDER — RANITIDINE HCL 150 MG PO TABS: 150.0000 mg | ORAL_TABLET | Freq: Two times a day (BID) | ORAL | Status: DC

## 2014-03-12 ENCOUNTER — Other Ambulatory Visit: Payer: Self-pay

## 2014-03-27 IMAGING — CT CT ANGIO HEAD
1 of 9 series · 6 of 47 positions shown · IV contrast (omnipaque)
Comparison: Intracranial CTA 10/23/2005.  Brain MRI 12/20/2004.

CLINICAL DATA: 51-year-old female with evidence of right ICA
siphon hemodynamically significant stenosis on prior CTA.  History
of decreased hearing on the right.

CT ANGIOGRAPHY HEAD
TECHNIQUE: Multidetector CT imaging of the head was performed
using the standard protocol during bolus administration of
intravenous contrast.  Multiplanar CT image reconstructions
including MIPs were obtained to evaluate the vascular anatomy.
Contrast: 80mL OMNIPAQUE IOHEXOL 350 MG/ML SOLN

[Series 7: headangio 2.0 h37f · axial · 0.43mm/px · z∈[-114,+2]mm · 6 of 82 slices shown]
[im 12/82  brain]
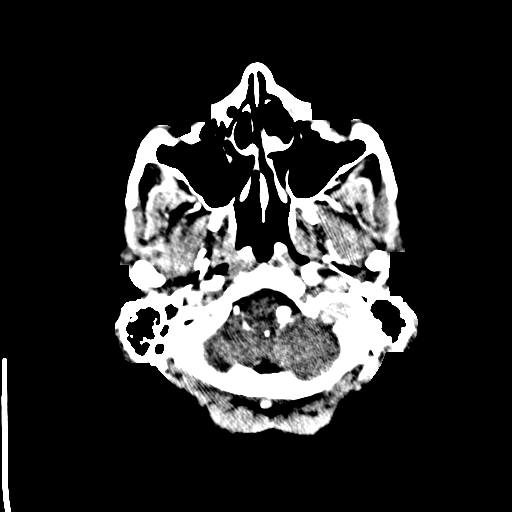
[im 24/82  bone]
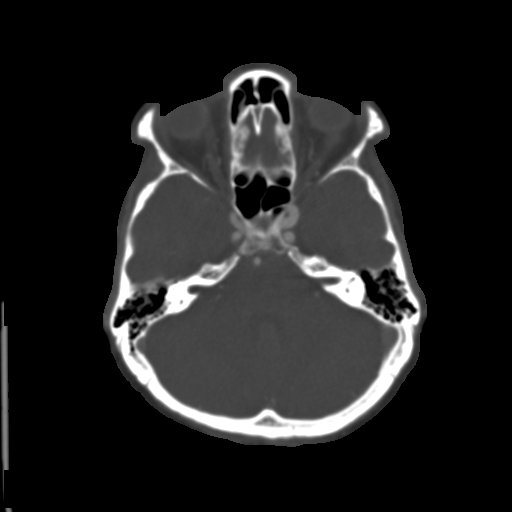
[im 35/82  brain]
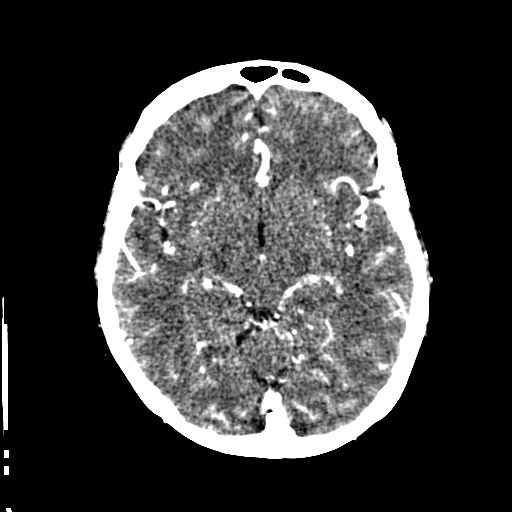
[im 47/82  bone]
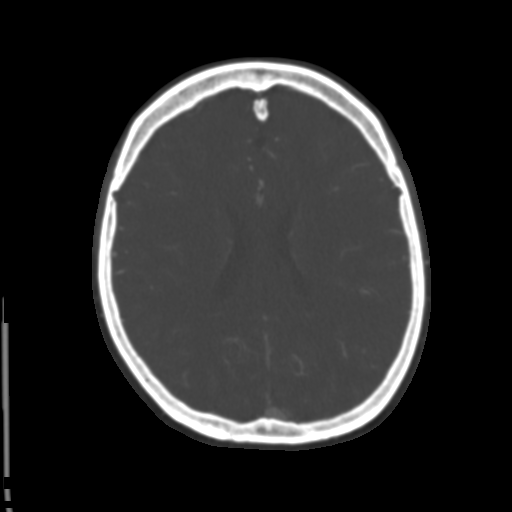
[im 58/82  brain]
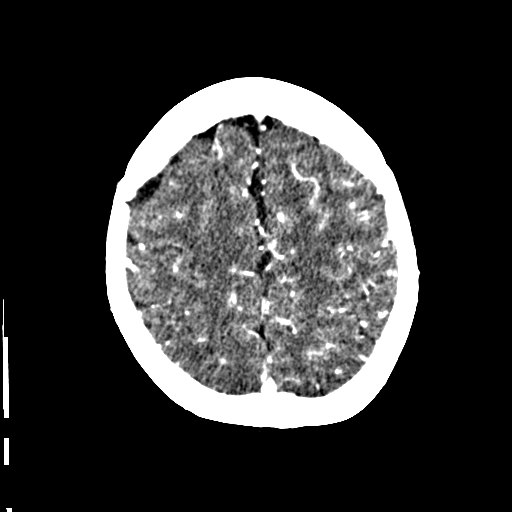
[im 70/82  bone]
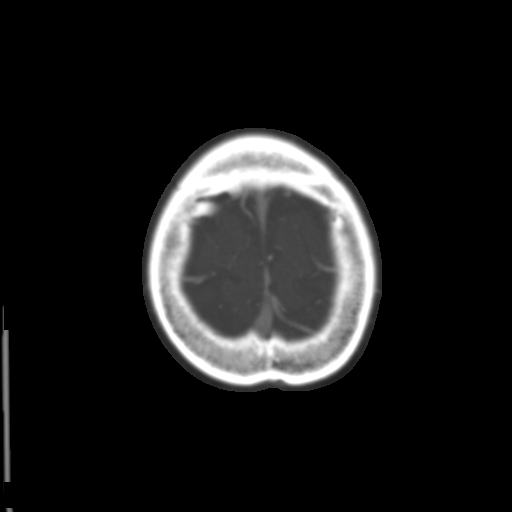

[6 of 47 positions shown; findings below may reference images not displayed]

FINDINGS: No acute or other scalp soft tissue finding identified.
Calvarium intact. No acute osseous abnormality identified.
Mastoids and tympanic cavities are clear.  There is ethmoid sinus
mucosal thickening, but other visible paranasal sinuses appear
clear.

Stable and normal cerebral volume.  No ventriculomegaly. No midline
shift, mass effect, or evidence of mass lesion.  No acute
intracranial hemorrhage identified.  Minimal to mild cerebral white
matter hypodensity. No evidence of cortically based acute
infarction identified.  No abnormal enhancement identified.

Vascular Findings: Major intracranial venous structures appear
normally enhancing.

Dominant distal left vertebral artery re-identified and normal.
The left PICA may be duplicated.  Nondominant distal right
vertebral artery which appears to functionally terminating PICA
(hypoplastic right V4 segment patent to the vertebrobasilar
junction.  Stable mildly tortuous vertebrobasilar junction.  No
basilar artery stenosis.  SCA and PCA origins are within normal
limits.  Diminutive left posterior communicating artery re-
identified.  The right posterior communicating artery is more
diminutive or absent.  Bilateral PCA branches are within normal
limits.

Patent distal cervical ICA, the left appears mildly dominant.  Mild
tortuosity of both at the skull base.  Likewise, the left ICA
siphon appears dominant.  It is patent without stenosis.  Normal
left posterior communicating artery and ophthalmic artery origins.
The right ICA siphon appears nondominant throughout.  The anterior
genu appears patent and has a normal caliber.  The proximal
supraclinoid segment at the level of the ophthalmic artery origin
appears normal, but in the vessel smoothly tapers in the mid and
distal supraclinoid segment to a caliber of 1-2 mm (versus 3-4 mm
in the more proximal siphon. The right ICA maintains the smaller
caliber to the terminus which is patent There is a diminutive right
posterior communicating artery or right anterior choroidal artery
identified.  The right ICA terminus is patent with a nondominant
and very diminutive right ACA A1 segment.  The right MCA origin is
patent and the right MCA M1 segment has a fairly normal caliber.

Subsequently the left ACA A1 segment is dominant.  The left ICA
terminus is patent.  The right ACA is primarily supplied from the
left via normal anterior communicating artery.  Bilateral ACA
branches then are within normal limits.

Normal left MCA origin and left MCA branches.

Right MCA branches likewise are within normal limits.  Right versus
left MCA M2 branch caliber appears symmetric and within normal
limits.

 Review of the MIP images confirms the above findings.
IMPRESSION: 1.  Diminutive supraclinoid right ICA segment, stable since 3883
and likely on the basis of congenital anatomic variation rather
than pathologic vessel stenosis/atherosclerosis.  The bilateral
ACAs are primarily supplied from the left ICA which is dominant.
2.  Otherwise stable and negative intracranial CTA.  Incidental
dominant distal left vertebral artery also noted.
3.  Negative for age CT appearance of the brain.

## 2014-03-30 ENCOUNTER — Encounter: Payer: Self-pay | Admitting: Internal Medicine

## 2014-05-04 ENCOUNTER — Encounter: Payer: Self-pay | Admitting: Internal Medicine

## 2014-05-17 ENCOUNTER — Ambulatory Visit: Payer: Self-pay | Admitting: Internal Medicine

## 2014-05-19 ENCOUNTER — Ambulatory Visit: Payer: Self-pay | Admitting: Internal Medicine

## 2014-07-27 ENCOUNTER — Other Ambulatory Visit: Payer: Self-pay

## 2014-07-27 DIAGNOSIS — Z1231 Encounter for screening mammogram for malignant neoplasm of breast: Secondary | ICD-10-CM

## 2014-08-09 ENCOUNTER — Encounter: Payer: Self-pay | Admitting: Internal Medicine

## 2014-08-09 ENCOUNTER — Ambulatory Visit (INDEPENDENT_AMBULATORY_CARE_PROVIDER_SITE_OTHER): Payer: Self-pay | Admitting: Internal Medicine

## 2014-08-09 ENCOUNTER — Ambulatory Visit
Admission: RE | Admit: 2014-08-09 | Discharge: 2014-08-09 | Disposition: A | Discharge status: Home / Self Care | Payer: No Typology Code available for payment source | Source: Ambulatory Visit

## 2014-08-09 VITALS — BP 102/58 | HR 73 | Temp 98.2°F | Resp 12 | Ht 67.0 in | Wt 114.0 lb

## 2014-08-09 DIAGNOSIS — K219 Gastro-esophageal reflux disease without esophagitis: Secondary | ICD-10-CM

## 2014-08-09 DIAGNOSIS — J302 Other seasonal allergic rhinitis: Secondary | ICD-10-CM

## 2014-08-09 DIAGNOSIS — Z1231 Encounter for screening mammogram for malignant neoplasm of breast: Secondary | ICD-10-CM

## 2014-08-09 DIAGNOSIS — Z0000 Encounter for general adult medical examination without abnormal findings: Secondary | ICD-10-CM

## 2014-08-09 DIAGNOSIS — M5030 Other cervical disc degeneration, unspecified cervical region: Secondary | ICD-10-CM

## 2014-08-09 MED ORDER — TIZANIDINE HCL 4 MG PO TABS: 4.0000 mg | ORAL_TABLET | Freq: Three times a day (TID) | ORAL | Status: DC

## 2014-08-09 NOTE — Assessment & Plan Note (Signed)
Chronic and she uses rhinocort year round. She does also add allegra during her worse times of the year for allergens.

## 2014-08-09 NOTE — Progress Notes (Signed)
Pre visit review using our clinic review tool, if applicable. No additional management support is needed unless otherwise documented below in the visit note. 

## 2014-08-09 NOTE — Assessment & Plan Note (Signed)
Does well with her medications and dietary management. No indication for further workup.

## 2014-08-09 NOTE — Patient Instructions (Signed)
We have refilled the tizanidine for you today and if you need any other refills please feel free to send [place] a message on the computer or call us.  The TB test was negative and we have filled out your form.   Come back in about 6-12 months or sooner if you have any new problems.   Health Maintenance Adopting a healthy lifestyle and getting preventive care can go a long way to promote health and wellness. Talk with your health care provider about what schedule of regular examinations is right for you. This is a good chance for you to check in with your provider about disease prevention and staying healthy. In between checkups, there are plenty of things you can do on your own. Experts have done a lot of research about which lifestyle changes and preventive measures are most likely to keep you healthy. Ask your health care provider for more information. WEIGHT AND DIET  Eat a healthy diet  Be sure to include plenty of vegetables, fruits, low-fat dairy products, and lean protein.  Do not eat a lot of foods high in solid fats, added sugars, or salt.  Get regular exercise. This is one of the most important things you can do for your health.  Most adults should exercise for at least 150 minutes each week. The exercise should increase your heart rate and make you sweat (moderate-intensity exercise).  Most adults should also do strengthening exercises at least twice a week. This is in addition to the moderate-intensity exercise.  Maintain a healthy weight  Body mass index (BMI) is a measurement that can be used to identify possible weight problems. It estimates body fat based on height and weight. Your health care provider can help determine your BMI and help you achieve or maintain a healthy weight.  For females 64 years of age and older:   A BMI below 18.5 is considered underweight.  A BMI of 18.5 to 24.9 is normal.  A BMI of 25 to 29.9 is considered overweight.  A BMI of 30 and above is  considered obese.  Watch levels of cholesterol and blood lipids  You should start having your blood tested for lipids and cholesterol at 55 years of age, then have this test every 5 years.  You may need to have your cholesterol levels checked more often if:  Your lipid or cholesterol levels are high.  You are older than 55 years of age.  You are at high risk for heart disease.  CANCER SCREENING   Lung Cancer  Lung cancer screening is recommended for adults 77-38 years old who are at high risk for lung cancer because of a history of smoking.  A yearly low-dose CT scan of the lungs is recommended for people who:  Currently smoke.  Have quit within the past 15 years.  Have at least a 30-pack-year history of smoking. A pack year is smoking an average of one pack of cigarettes a day for 1 year.  Yearly screening should continue until it has been 15 years since you quit.  Yearly screening should stop if you develop a health problem that would prevent you from having lung cancer treatment.  Breast Cancer  Practice breast self-awareness. This means understanding how your breasts normally appear and feel.  It also means doing regular breast self-exams. Let your health care provider know about any changes, no matter how small.  If you are in your 20s or 30s, you should have a clinical breast exam (  CBE) by a health care provider every 1-3 years as part of a regular health exam.  If you are 40 or older, have a CBE every year. Also consider having a breast X-ray (mammogram) every year.  If you have a family history of breast cancer, talk to your health care provider about genetic screening.  If you are at high risk for breast cancer, talk to your health care provider about having an MRI and a mammogram every year.  Breast cancer gene (BRCA) assessment is recommended for women who have family members with BRCA-related cancers. BRCA-related cancers  include:  Breast.  Ovarian.  Tubal.  Peritoneal cancers.  Results of the assessment will determine the need for genetic counseling and BRCA1 and BRCA2 testing. Cervical Cancer Routine pelvic examinations to screen for cervical cancer are no longer recommended for nonpregnant women who are considered low risk for cancer of the pelvic organs (ovaries, uterus, and vagina) and who do not have symptoms. A pelvic examination may be necessary if you have symptoms including those associated with pelvic infections. Ask your health care provider if a screening pelvic exam is right for you.   The Pap test is the screening test for cervical cancer for women who are considered at risk.  If you had a hysterectomy for a problem that was not cancer or a condition that could lead to cancer, then you no longer need Pap tests.  If you are older than 65 years, and you have had normal Pap tests for the past 10 years, you no longer need to have Pap tests.  If you have had past treatment for cervical cancer or a condition that could lead to cancer, you need Pap tests and screening for cancer for at least 20 years after your treatment.  If you no longer get a Pap test, assess your risk factors if they change (such as having a new sexual partner). This can affect whether you should start being screened again.  Some women have medical problems that increase their chance of getting cervical cancer. If this is the case for you, your health care provider may recommend more frequent screening and Pap tests.  The human papillomavirus (HPV) test is another test that may be used for cervical cancer screening. The HPV test looks for the virus that can cause cell changes in the cervix. The cells collected during the Pap test can be tested for HPV.  The HPV test can be used to screen women 30 years of age and older. Getting tested for HPV can extend the interval between normal Pap tests from three to five years.  An HPV  test also should be used to screen women of any age who have unclear Pap test results.  After 55 years of age, women should have HPV testing as often as Pap tests.  Colorectal Cancer  This type of cancer can be detected and often prevented.  Routine colorectal cancer screening usually begins at 55 years of age and continues through 55 years of age.  Your health care provider may recommend screening at an earlier age if you have risk factors for colon cancer.  Your health care provider may also recommend using home test kits to check for hidden blood in the stool.  A small camera at the end of a tube can be used to examine your colon directly (sigmoidoscopy or colonoscopy). This is done to check for the earliest forms of colorectal cancer.  Routine screening usually begins at age 50.    Direct examination of the colon should be repeated every 5-10 years through 55 years of age. However, you may need to be screened more often if early forms of precancerous polyps or small growths are found. Skin Cancer  Check your skin from head to toe regularly.  Tell your health care provider about any new moles or changes in moles, especially if there is a change in a mole's shape or color.  Also tell your health care provider if you have a mole that is larger than the size of a pencil eraser.  Always use sunscreen. Apply sunscreen liberally and repeatedly throughout the day.  Protect yourself by wearing long sleeves, pants, a wide-brimmed hat, and sunglasses whenever you are outside. HEART DISEASE, DIABETES, AND HIGH BLOOD PRESSURE   Have your blood pressure checked at least every 1-2 years. High blood pressure causes heart disease and increases the risk of stroke.  If you are between 83 years and 38 years old, ask your health care provider if you should take aspirin to prevent strokes.  Have regular diabetes screenings. This involves taking a blood sample to check your fasting blood sugar  level.  If you are at a normal weight and have a low risk for diabetes, have this test once every three years after 55 years of age.  If you are overweight and have a high risk for diabetes, consider being tested at a younger age or more often. PREVENTING INFECTION  Hepatitis B  If you have a higher risk for hepatitis B, you should be screened for this virus. You are considered at high risk for hepatitis B if:  You were born in a country where hepatitis B is common. Ask your health care provider which countries are considered high risk.  Your parents were born in a high-risk country, and you have not been immunized against hepatitis B (hepatitis B vaccine).  You have HIV or AIDS.  You use needles to inject street drugs.  You live with someone who has hepatitis B.  You have had sex with someone who has hepatitis B.  You get hemodialysis treatment.  You take certain medicines for conditions, including cancer, organ transplantation, and autoimmune conditions. Hepatitis C  Blood testing is recommended for:  Everyone born from [birth year] through 1966.  Anyone with known risk factors for hepatitis C. Sexually transmitted infections (STIs)  You should be screened for sexually transmitted infections (STIs) including gonorrhea and chlamydia if:  You are sexually active and are younger than 55 years of age.  You are older than 55 years of age and your health care provider tells you that you are at risk for this type of infection.  Your sexual activity has changed since you were last screened and you are at an increased risk for chlamydia or gonorrhea. Ask your health care provider if you are at risk.  If you do not have HIV, but are at risk, it may be recommended that you take a prescription medicine daily to prevent HIV infection. This is called pre-exposure prophylaxis (PrEP). You are considered at risk if:  You are sexually active and do not regularly use condoms or know the HIV status  of your partner(s).  You take drugs by injection.  You are sexually active with a partner who has HIV. Talk with your health care provider about whether you are at high risk of being infected with HIV. If you choose to begin PrEP, you should first be tested for HIV. You should then be  tested every 3 months for as long as you are taking PrEP.  PREGNANCY   If you are premenopausal and you may become pregnant, ask your health care provider about preconception counseling.  If you may become pregnant, take 400 to 800 micrograms (mcg) of folic acid every day.  If you want to prevent pregnancy, talk to your health care provider about birth control (contraception). OSTEOPOROSIS AND MENOPAUSE   Osteoporosis is a disease in which the bones lose minerals and strength with aging. This can result in serious bone fractures. Your risk for osteoporosis can be identified using a bone density scan.  If you are 31 years of age or older, or if you are at risk for osteoporosis and fractures, ask your health care provider if you should be screened.  Ask your health care provider whether you should take a calcium or vitamin D supplement to lower your risk for osteoporosis.  Menopause may have certain physical symptoms and risks.  Hormone replacement therapy may reduce some of these symptoms and risks. Talk to your health care provider about whether hormone replacement therapy is right for you.  HOME CARE INSTRUCTIONS   Schedule regular health, dental, and eye exams.  Stay current with your immunizations.   Do not use any tobacco products including cigarettes, chewing tobacco, or electronic cigarettes.  If you are pregnant, do not drink alcohol.  If you are breastfeeding, limit how much and how often you drink alcohol.  Limit alcohol intake to no more than 1 drink per day for nonpregnant women. One drink equals 12 ounces of beer, 5 ounces of wine, or 1 ounces of hard liquor.  Do not use street  drugs.  Do not share needles.  Ask your health care provider for help if you need support or information about quitting drugs.  Tell your health care provider if you often feel depressed.  Tell your health care provider if you have ever been abused or do not feel safe at home. Document Released: 11/27/2010 Document Revised: 09/28/2013 Document Reviewed: 04/15/2013 [facility] Patient Information 2015 [place], [place]. This information is not intended to replace advice given to you by your health care provider. Make sure you discuss any questions you have with your health care provider.

## 2014-08-09 NOTE — Progress Notes (Signed)
   Subjective:    Patient ID: Cathy Woodward, female    DOB: Nov 27, 1960, 54 y.o.   MRN: [id number]  HPI The patient is a 54 YO female who comes in today for wellness. She denies any new problems since last visit. Some mild dyspepsia symptoms which are under good control. She has some cervical degeneration but uses tizanidine with good success.   PMH, [facility] social history reviewed and updated with the patient.   Review of Systems  Constitutional: Negative for fever, chills, activity change, appetite change, fatigue and unexpected weight change.  HENT: Positive for congestion, postnasal drip and rhinorrhea.        Chronic, stable  Eyes: Negative.   Respiratory: Negative for cough, shortness of breath and wheezing.   Cardiovascular: Negative for chest pain, palpitations and leg swelling.  Gastrointestinal: Negative for nausea, abdominal pain, diarrhea, constipation and abdominal distention.  Musculoskeletal: Positive for back pain. Negative for myalgias and arthralgias.  Skin: Negative.   Neurological: Negative.   Psychiatric/Behavioral: Negative.       Objective:   Physical Exam  Constitutional: She is oriented to person, place, and time. She appears well-developed and well-nourished.  HENT:  Head: Normocephalic and atraumatic.  Eyes: EOM are normal.  Neck: Normal range of motion.  Cardiovascular: Normal rate and regular rhythm.   Murmur heard. Pulmonary/Chest: Effort normal and breath sounds normal.  Abdominal: Soft. Bowel sounds are normal. She exhibits no distension. There is no tenderness.  Neurological: She is alert and oriented to person, place, and time. Coordination normal.  Skin: Skin is warm and dry.  Psychiatric: She has a normal mood and affect.   Filed Vitals:   08/09/14 0845  BP: 102/58  Pulse: 73  Temp: 98.2 F (36.8 C)  TempSrc: Oral  Resp: 12  Height: 5\' 7"  (1.702 m)  Weight: 114 lb (51.71 kg)  SpO2: 99%      Assessment & Plan:

## 2014-08-09 NOTE — Assessment & Plan Note (Signed)
Tdap due in 2023, colonoscopy due in 2022, flu shot done this season. Mammogram done last year and she is getting one this week. No indication for pap smear since hysterectomy for fibroids.

## 2014-08-09 NOTE — Assessment & Plan Note (Signed)
She uses tizanidine for pain as needed. No acute flare at this time.

## 2015-05-03 ENCOUNTER — Encounter: Payer: Self-pay | Admitting: Internal Medicine

## 2015-05-04 ENCOUNTER — Other Ambulatory Visit: Payer: Self-pay | Admitting: Geriatric Medicine

## 2015-05-04 MED ORDER — RANITIDINE HCL 150 MG PO TABS: 150.0000 mg | ORAL_TABLET | Freq: Two times a day (BID) | ORAL | Status: DC

## 2015-05-17 ENCOUNTER — Ambulatory Visit (INDEPENDENT_AMBULATORY_CARE_PROVIDER_SITE_OTHER): Payer: Self-pay | Admitting: Cardiovascular Disease

## 2015-05-17 ENCOUNTER — Encounter: Payer: Self-pay | Admitting: Cardiovascular Disease

## 2015-05-17 ENCOUNTER — Encounter: Payer: Self-pay | Admitting: *Deleted

## 2015-05-17 VITALS — BP 110/64 | HR 80 | Ht 67.0 in | Wt 117.0 lb

## 2015-05-17 DIAGNOSIS — I059 Rheumatic mitral valve disease, unspecified: Secondary | ICD-10-CM

## 2015-05-17 DIAGNOSIS — E785 Hyperlipidemia, unspecified: Secondary | ICD-10-CM

## 2015-05-17 MED ORDER — COQ10 100 MG PO CAPS: 100.0000 mg | ORAL_CAPSULE | Freq: Every day | ORAL | Status: DC

## 2015-05-17 MED ORDER — ATORVASTATIN CALCIUM 10 MG PO TABS: ORAL_TABLET | ORAL | Status: DC

## 2015-05-17 NOTE — Progress Notes (Signed)
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 She did work at [employer] back in the early 2000. She has no cardiac risk factors other than mild hyper-lipidemia with an LDL of 164. She does say that her diet is healthy. She has no symptoms. She does have a history of a symptomatically mitral valve prolapse last check by 2-D echo back in 2012 with mild MR. A more recent EKG shows septal Q waves although there is no clinical history of a myocardial infarction.   Current Outpatient Prescriptions  Medication Sig Dispense Refill  . Ascorbic Acid (VITAMIN C) 500 MG tablet Take 500 mg by mouth daily.      Marland Kitchen desoximetasone (TOPICORT) 0.25 % cream Apply topically as directed. 30 g 3  . fexofenadine (ALLEGRA) 60 MG tablet Take 1 tablet (60 mg total) by mouth 2 (two) times daily. 60 tablet 4  . fluticasone (FLONASE) 50 MCG/ACT nasal spray Place 2 sprays into the nose daily. 48 g 3  . Multiple Vitamin (MULTIVITAMIN) tablet Take 1 tablet by mouth daily.    . pantoprazole (PROTONIX) 40 MG tablet Take 1 tablet (40 mg total) by mouth every morning. (Patient taking differently: Take 40 mg by mouth as needed. ) 90 tablet 3  . ranitidine (ZANTAC) 150 MG tablet Take 1 tablet (150 mg total) by mouth 2 (two) times daily. 180 tablet 3  . tiZANidine (ZANAFLEX) 4 MG tablet Take 1 tablet (4 mg total) by mouth 3 (three) times daily. 90 tablet 3  . vitamin E 400 UNIT capsule Take 400 Units by mouth daily.      Marland Kitchen atorvastatin (LIPITOR) 10 MG tablet ONE TABLET 3 TIMES WEEKLY 15 tablet 12  . Coenzyme Q10 (COQ10) 100 MG CAPS Take 100 mg by mouth daily. 30 each 12   No current facility-administered medications for this visit.    Allergies    Allergen Reactions  . Aspirin     REACTION: causes vertigo  . Doxycycline   . Erythromycin   . Lansoprazole   . Minocin [Minocycline Hcl]     Social History   Social History  . Marital Status: Single    Spouse Name: N/A  . Number of Children: N/A  . Years of Education: 78   Occupational History  . physician-internist     [employer]   Social History Main Topics  . Smoking status: Never Smoker   . Smokeless tobacco: Never Used  . Alcohol Use: Yes     Comment: occ  . Drug Use: No  . Sexual Activity: No   Other Topics Concern  . 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 [employer] years 3-4 medical school. 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 Traveled to [place] in Sept '11. Single - has a house in [place] - home base.     Review of Systems: General: negative for chills, fever, night sweats or weight changes.  Cardiovascular: negative for chest pain, dyspnea on exertion, edema, orthopnea, palpitations, paroxysmal nocturnal dyspnea or shortness of breath Dermatological: negative for rash Respiratory: negative for cough or wheezing Urologic: negative for hematuria Abdominal: negative for nausea, vomiting, diarrhea,  bright red blood per rectum, melena, or hematemesis Neurologic: negative for visual changes, syncope, or dizziness All other systems reviewed and are otherwise negative except as noted above.    Blood pressure 110/64, pulse 80, height 5\' 7"  (1.702 m), weight 117 lb (53.071 kg).  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 I personally reviewed this EKG  ASSESSMENT AND PLAN:   Mitral valve disorder History of asymptomatic mitral valve prolapse. I cannot hear an MR murmur on exam today. Her last echo was 4 years ago. I will recheck a 2-D echocardiogram for MVP/MR and LV function  Hyperlipidemia History of hyperlipidemia with recent lipid profile performed 04/22/15 revealed a total cholesterol 263 with LDL of 164 and HDL 75. Her cardiac risk is low especially given her lack of risk factors and elevated HDL although I am somewhat uncomfortable with the level of her LDL. She does have a fairly clean diet". We talked about starting a low-dose statin such as Lipitor 10 mg 3 times a week and recheck a lipid liver profile in 2 months.      Lorretta Harp MD FACP,FACC,FAHA, [facility] 05/17/2015 8:56 AM

## 2015-05-17 NOTE — Patient Instructions (Addendum)
Medication Instructions:   START ATORVASTATIN 10 MG ONE TABLET 3 X WEEKLY  START COQ10 100 MG ONCE DAILY  Your physician recommends that you return for lab work in: 2 MONTHS=DO NOT EAT PRIOR TO LAB WORK  Testing/Procedures:  Your physician has requested that you have an echocardiogram. Echocardiography is a painless test that uses sound waves to create images of your heart. It provides your doctor with information about the size and shape of your heart and how well your heart's chambers and valves are working. This procedure takes approximately one hour. There are no restrictions for this procedure.    Follow-Up:  Your physician recommends that you schedule a follow-up appointment in: AS NEEDED PENDING TEST RESULTS

## 2015-05-17 NOTE — Assessment & Plan Note (Signed)
History of asymptomatic mitral valve prolapse. I cannot hear an MR murmur on exam today. Her last echo was 4 years ago. I will recheck a 2-D echocardiogram for MVP/MR and LV function

## 2015-05-17 NOTE — Assessment & Plan Note (Addendum)
History of hyperlipidemia with recent lipid profile performed 04/22/15 revealed a total cholesterol 263 with LDL of 164 and HDL 75. Her cardiac risk is low especially given her lack of risk factors and elevated HDL although I am somewhat uncomfortable with the level of her LDL. She does have a fairly clean diet". We talked about starting a low-dose statin such as Lipitor 10 mg 3 times a week and recheck a lipid liver profile in 2 months.

## 2015-05-24 ENCOUNTER — Ambulatory Visit (HOSPITAL_COMMUNITY): Payer: Self-pay | Attending: Cardiovascular Disease

## 2015-05-24 ENCOUNTER — Other Ambulatory Visit: Payer: Self-pay

## 2015-05-24 DIAGNOSIS — I340 Nonrheumatic mitral (valve) insufficiency: Secondary | ICD-10-CM | POA: Insufficient documentation

## 2015-05-24 DIAGNOSIS — I059 Rheumatic mitral valve disease, unspecified: Secondary | ICD-10-CM

## 2015-05-24 DIAGNOSIS — I341 Nonrheumatic mitral (valve) prolapse: Secondary | ICD-10-CM | POA: Insufficient documentation

## 2015-05-25 ENCOUNTER — Telehealth: Payer: Self-pay

## 2015-05-25 ENCOUNTER — Encounter: Payer: Self-pay | Admitting: Cardiovascular Disease

## 2015-05-25 DIAGNOSIS — I059 Rheumatic mitral valve disease, unspecified: Secondary | ICD-10-CM

## 2015-05-25 NOTE — Telephone Encounter (Signed)
Called pt and reviewed in detail the Echo results.  Sent copy echo report to home address as requested.

## 2015-05-25 NOTE — Telephone Encounter (Signed)
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 Nl LV size and fxn. Repeat 24 months

## 2015-09-05 ENCOUNTER — Encounter: Payer: Self-pay | Admitting: Internal Medicine

## 2015-09-05 ENCOUNTER — Other Ambulatory Visit: Payer: Self-pay

## 2015-09-05 DIAGNOSIS — Z1231 Encounter for screening mammogram for malignant neoplasm of breast: Secondary | ICD-10-CM

## 2015-09-05 MED ORDER — CYCLOBENZAPRINE HCL 10 MG PO TABS
10.0000 mg | ORAL_TABLET | Freq: Three times a day (TID) | ORAL | Status: DC | PRN
Start: 2015-09-05 — End: 2018-04-21

## 2015-09-20 ENCOUNTER — Ambulatory Visit
Admission: RE | Admit: 2015-09-20 | Discharge: 2015-09-20 | Disposition: A | Discharge status: Home / Self Care | Payer: No Typology Code available for payment source | Source: Ambulatory Visit

## 2015-09-20 DIAGNOSIS — Z1231 Encounter for screening mammogram for malignant neoplasm of breast: Secondary | ICD-10-CM

## 2015-09-22 ENCOUNTER — Other Ambulatory Visit: Payer: Self-pay | Admitting: Obstetrics and Gynecology

## 2015-09-22 DIAGNOSIS — R928 Other abnormal and inconclusive findings on diagnostic imaging of breast: Secondary | ICD-10-CM

## 2015-12-28 ENCOUNTER — Other Ambulatory Visit: Payer: Self-pay | Admitting: Obstetrics and Gynecology

## 2015-12-28 ENCOUNTER — Ambulatory Visit
Admission: RE | Admit: 2015-12-28 | Discharge: 2015-12-28 | Disposition: A | Discharge status: Home / Self Care | Payer: No Typology Code available for payment source | Source: Ambulatory Visit | Attending: Obstetrics and Gynecology | Admitting: Obstetrics and Gynecology

## 2015-12-28 DIAGNOSIS — R928 Other abnormal and inconclusive findings on diagnostic imaging of breast: Secondary | ICD-10-CM

## 2015-12-28 DIAGNOSIS — N6310 Unspecified lump in the right breast, unspecified quadrant: Secondary | ICD-10-CM

## 2015-12-29 ENCOUNTER — Encounter: Payer: Self-pay | Admitting: *Deleted

## 2015-12-29 ENCOUNTER — Telehealth: Payer: Self-pay | Admitting: *Deleted

## 2015-12-29 DIAGNOSIS — C50111 Malignant neoplasm of central portion of right female breast: Secondary | ICD-10-CM

## 2015-12-29 HISTORY — DX: Malignant neoplasm of central portion of right female breast: C50.111

## 2015-12-29 NOTE — Telephone Encounter (Signed)
Confirmed [facility] for 01/04/16 at 0815 .  Instructions and contact information given.

## 2016-01-04 ENCOUNTER — Ambulatory Visit: Payer: Self-pay | Admitting: Surgery

## 2016-01-04 ENCOUNTER — Ambulatory Visit: Payer: Self-pay | Attending: Surgery | Admitting: Physical Therapy

## 2016-01-04 ENCOUNTER — Ambulatory Visit (HOSPITAL_BASED_OUTPATIENT_CLINIC_OR_DEPARTMENT_OTHER): Payer: Self-pay | Admitting: Hematology and Oncology

## 2016-01-04 ENCOUNTER — Ambulatory Visit
Admission: RE | Admit: 2016-01-04 | Discharge: 2016-01-04 | Disposition: A | Discharge status: Home / Self Care | Payer: Self-pay | Source: Ambulatory Visit | Attending: Radiation Oncology | Admitting: Radiation Oncology

## 2016-01-04 ENCOUNTER — Encounter: Payer: Self-pay | Admitting: *Deleted

## 2016-01-04 ENCOUNTER — Encounter: Payer: Self-pay | Admitting: Genetic Counselor

## 2016-01-04 ENCOUNTER — Encounter: Payer: Self-pay | Admitting: Skilled Nursing Facility1

## 2016-01-04 ENCOUNTER — Other Ambulatory Visit (HOSPITAL_BASED_OUTPATIENT_CLINIC_OR_DEPARTMENT_OTHER): Payer: Self-pay

## 2016-01-04 ENCOUNTER — Encounter: Payer: Self-pay | Admitting: Physical Therapy

## 2016-01-04 DIAGNOSIS — C50111 Malignant neoplasm of central portion of right female breast: Secondary | ICD-10-CM

## 2016-01-04 DIAGNOSIS — R293 Abnormal posture: Secondary | ICD-10-CM | POA: Insufficient documentation

## 2016-01-04 DIAGNOSIS — C50911 Malignant neoplasm of unspecified site of right female breast: Secondary | ICD-10-CM

## 2016-01-04 LAB — CBC WITH DIFFERENTIAL/PLATELET
BASO%: 0.7 % (ref 0.0–2.0)
BASOS ABS: 0.1 10*3/uL (ref 0.0–0.1)
EOS ABS: 0.5 10*3/uL (ref 0.0–0.5)
EOS%: 7.2 % — AB (ref 0.0–7.0)
HEMATOCRIT: 43 % (ref 34.8–46.6)
HEMOGLOBIN: 13.8 g/dL (ref 11.6–15.9)
LYMPH#: 2.6 10*3/uL (ref 0.9–3.3)
LYMPH%: 35.4 % (ref 14.0–49.7)
MCH: 25.7 pg (ref 25.1–34.0)
MCHC: 32.1 g/dL (ref 31.5–36.0)
MCV: 79.9 fL (ref 79.5–101.0)
MONO#: 0.6 10*3/uL (ref 0.1–0.9)
MONO%: 8 % (ref 0.0–14.0)
NEUT%: 48.7 % (ref 38.4–76.8)
NEUTROS ABS: 3.6 10*3/uL (ref 1.5–6.5)
Platelets: 243 10*3/uL (ref 145–400)
RBC: 5.38 10*6/uL (ref 3.70–5.45)
RDW: 14.3 % (ref 11.2–14.5)
WBC: 7.3 10*3/uL (ref 3.9–10.3)

## 2016-01-04 LAB — COMPREHENSIVE METABOLIC PANEL
ALBUMIN: 4.1 g/dL (ref 3.5–5.0)
ALK PHOS: 99 U/L (ref 40–150)
ALT: 16 U/L (ref 0–55)
AST: 19 U/L (ref 5–34)
Anion Gap: 11 mEq/L (ref 3–11)
BILIRUBIN TOTAL: 0.45 mg/dL (ref 0.20–1.20)
BUN: 15.3 mg/dL (ref 7.0–26.0)
CALCIUM: 10.6 mg/dL — AB (ref 8.4–10.4)
CO2: 25 mEq/L (ref 22–29)
Chloride: 105 mEq/L (ref 98–109)
Creatinine: 0.8 mg/dL (ref 0.6–1.1)
EGFR: 84 mL/min/{1.73_m2} — ABNORMAL LOW (ref >90)
GLUCOSE: 83 mg/dL (ref 70–140)
POTASSIUM: 3.8 meq/L (ref 3.5–5.1)
SODIUM: 141 meq/L (ref 136–145)
TOTAL PROTEIN: 8.5 g/dL — AB (ref 6.4–8.3)

## 2016-01-04 NOTE — Progress Notes (Signed)
For the patient to understand and be given the tools to implement a healthy plant based diet during their cancer diagnosis.   Patient was seen today and found to be pleasant and alone. Pt was actively listening. Pt was open to the addition of soy back into her diet. Pt had been avoiding soy due to her fear of it affecting her cancer in a negative way. Pts labs WNL. Pts ht 67in, 116 pounds, BMI 18.3    Dietitian educated the patient on implementing a plant based diet by incorporating more plant proteins, fruits, and vegetables. As a part of a healthy routine physical activity was discussed. The importance of legitimate, evidence based information was discussed and examples were given. A folder of evidence based information with a focus on a plant based diet and general nutrition during cancer was given to the patient.  As a part of the continuum of care the cancer dietitian's contact information was given to the patient in the event they would like to have a follow up appointment.

## 2016-01-04 NOTE — Therapy (Signed)
[place], [place], [address] Phone: [phone]   Fax:  [phone]  Physical Therapy Evaluation  Patient Details  Name: Cathy Woodward MRN: [id number] Date of Birth: 1961/03/09 Referring Provider: Dr. Erroll Luna  Encounter Date: 01/04/2016      PT End of Session - 01/04/16 1252    Visit Number 1   Number of Visits 1   PT Start Time 0940   PT Stop Time 1008   PT Time Calculation (min) 28 min   Activity Tolerance Patient tolerated treatment well   Behavior During Therapy [facility] for tasks assessed/performed      Past Medical History:  Diagnosis Date  . Abnormal EKG    septal Q waves  . Allergic rhinitis   . Brachial neuritis or radiculitis NOS   . 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 Chickenpox   . GERD (gastroesophageal reflux disease)   . Hx of migraines   . Hyperlipidemia   . Leiomyoma of uterus, unspecified   . MVP (mitral valve prolapse)   . Right-sided sensorineural hearing loss   . Temporomandibular joint disorders, unspecified     Past Surgical History:  Procedure Laterality Date  . Excision of cervical lymph node anterior cervical chain      There were no vitals filed for this visit.       Subjective Assessment - 01/04/16 1253    Subjective Patient reports she is here today to be seen by her medical team for her newly diagnosed right breast cancer.   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  It is ER/PR positive and HER2 negative with a Ki67 of 5%. It is located centrally and measures 1 cm in size.   Patient Stated Goals Reduce lymphedema risk and learn post op shoulder ROM HEP            [facility] PT Assessment - 01/04/16 0001      Assessment   Medical Diagnosis Right breast cancer   Referring Provider Dr. [name]  [place]   Onset Date/Surgical Date 09/20/15   Hand Dominance Right   Prior Therapy none     Precautions   Precautions Other (comment)   Precaution Comments Active cancer     Restrictions   Weight Bearing Restrictions No     Balance Screen   Has the patient fallen in the past 6 months No   Has the patient had a decrease in activity level because of a fear of falling?  No   Is the patient reluctant to leave their home because of a fear of falling?  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   Leisure She walks alot but not for exercise     Cognition   Overall Cognitive Status Within Functional Limits for tasks assessed     Posture/Postural Control   Posture/Postural Control Postural limitations   Postural Limitations Forward head;Rounded Shoulders     ROM / Strength   AROM / PROM /  Strength AROM;Strength     AROM   AROM Assessment Site Shoulder   Right/Left Shoulder Right;Left   Right Shoulder Extension 36 Degrees   Right Shoulder Flexion 145 Degrees   Right Shoulder ABduction 169 Degrees   Right Shoulder Internal Rotation 69 Degrees   Right Shoulder External Rotation 69 Degrees   Left Shoulder Extension 45 Degrees   Left Shoulder Flexion 155 Degrees   Left Shoulder ABduction 155 Degrees   Left Shoulder Internal Rotation 63 Degrees   Left Shoulder External Rotation 81 Degrees     Strength   Overall Strength Within functional limits for tasks performed           LYMPHEDEMA/ONCOLOGY QUESTIONNAIRE - 01/04/16 1250      Type   Cancer Type Right breast cancer     Lymphedema Assessments   Lymphedema Assessments Upper extremities     Right Upper  Extremity Lymphedema   10 cm Proximal to Olecranon Process 23 cm   Olecranon Process 21.6 cm   10 cm Proximal to Ulnar Styloid Process 16.5 cm   Just Proximal to Ulnar Styloid Process 12.9 cm   Across Hand at [employer] 17.2 cm   At [place] of 2nd Digit 5.8 cm     Left Upper Extremity Lymphedema   10 cm Proximal to Olecranon Process 23.2 cm   Olecranon Process 22 cm   10 cm Proximal to Ulnar Styloid Process 16.8 cm   Just Proximal to Ulnar Styloid Process 12.8 cm   Across Hand at [employer] 16.9 cm   At [place] of 2nd Digit 6 cm      Patient was instructed today in a home exercise program today for post op shoulder range of motion. These included active assist shoulder flexion in sitting, scapular retraction, wall walking with shoulder abduction, and hands behind head external rotation.  She was encouraged to do these twice a day, holding 3 seconds and repeating 5 times when permitted by her physician.         PT Education - 01/04/16 1252    Education provided Yes   Education Details Lymphedema risk reduction and post op shoulder ROM HEP   Person(s) Educated Patient   Methods Explanation;Demonstration;Handout   Comprehension Returned demonstration;Verbalized understanding              Breast Clinic Goals - 01/04/16 1301      Patient will be able to verbalize understanding of pertinent lymphedema risk reduction practices relevant to her diagnosis specifically related to skin care.   Time 1   Period Days   Status Achieved     Patient will be able to return demonstrate and/or verbalize understanding of the post-op home exercise program related to regaining shoulder range of motion.   Time 1   Period Days   Status Achieved     Patient will be able to verbalize understanding of the importance of attending the postoperative After Breast Cancer Class for further lymphedema risk reduction education and therapeutic exercise.   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  It is ER/PR positive and HER2 negative with a Ki67 of 5%. It is located centrally and measures 1 cm in size.  Her multidisciplinary medical team met prior to her  assessments to determine a recommended treatment plan.  She is planning to have a right lumpectomy and sentinel node biopsy followed by Oncotype testing, radiation, and anti-estrogen therapy.  She may benefit from post op PT to regain shoulder ROM and reduce lymphedema.  Due to her lack of comorbidities, her evaluation is of low complexity.   Rehab Potential Excellent   Clinical Impairments Affecting Rehab Potential none   PT Frequency One time visit   PT Treatment/Interventions Patient/family education;Therapeutic exercise   PT Next Visit Plan Will f/u after surgery to determine need for PT   PT Home Exercise Plan Post op shoulder ROM HEP   Consulted and Agree with Plan of Care Patient      Patient will benefit from skilled therapeutic intervention in order to improve the following deficits and impairments:  Postural dysfunction, Pain, Decreased knowledge of precautions, Impaired UE functional use, Decreased range of motion  Visit Diagnosis: Cancer of central portion of female breast, right - Plan: PT plan of care cert/re-cert  Abnormal posture - Plan: PT plan of care cert/re-cert   Patient will follow up at outpatient cancer rehab if needed following surgery.  If the patient requires physical therapy at that time, a specific plan will be dictated and sent to the referring physician for approval. The patient was educated today on appropriate basic range of motion exercises to begin post operatively and the importance of attending the After Breast Cancer class following surgery.  Patient was educated today on lymphedema risk reduction practices as it pertains to  recommendations that will benefit the patient immediately following surgery.  She verbalized good understanding.  No additional physical therapy is indicated at this time.      Problem List Patient Active Problem List   Diagnosis Date Noted  . 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 Hyperlipidemia 05/17/2015  . Carotid artery stenosis, asymptomatic 02/03/2012  . Routine health maintenance 02/03/2012  . Degeneration of cervical intervertebral disc 09/01/2007  . Allergic rhinitis 07/22/2007  . GERD 07/22/2007  . Mitral valve disorder 05/14/2007    Annia Friendly, PT 01/04/16 1:04 PM  [place], [place], [address] Phone: [phone]   Fax:  [phone]  Name: TERRIANNA HOLSCLAW MRN: [id number] Date of Birth: [birth year]

## 2016-01-04 NOTE — Progress Notes (Signed)
Cathy Woodward NOTE  Patient Care Team: [name], MD as PCP - General (Internal Medicine) Delsa Bern, MD (Obstetrics and Gynecology) Sharyne Peach, MD (Ophthalmology) Juanita Craver, MD (Gastroenterology) Erroll Luna, MD as Consulting Physician (General Surgery) Nicholas Lose, MD as Consulting Physician (Hematology and Oncology) Kyung Rudd, MD as Consulting Physician (Radiation Oncology)  CHIEF COMPLAINTS/PURPOSE OF CONSULTATION:  Newly diagnosed breast cancer  HISTORY OF PRESENTING ILLNESS:  Cathy Woodward 55 y.o. female is here because of recent diagnosis of right breast cancer. 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 She is originally from [place] from an [place] origin. She had a screening mammogram that revealed distortion along with some nipple retraction. She had ultrasound evaluation that revealed a 1 x 0.8 x 0.8 cm mass in the retroareolar region. Axilla was negative. The biopsy of this mass revealed invasive lobular cancer with LCIS grade 1 that was EF 70% PR 20% Ki-67 5% and HER-2 negative. She was presented this morning to the multidisciplinary tumor board and she is here today to discuss a treatment plan.  I reviewed her records extensively and collaborated the history with the patient.  SUMMARY OF ONCOLOGIC HISTORY:   Cancer of central portion of female breast, right   12/28/2015 Initial Diagnosis    Right breast biopsy retroareolar: Invasive lobular cancer with LCIS grade 1 EF 70%, PR 20%, Ki-67 5%, HER-2 negative, ratio 1.17; screening call back for distortion, ultrasound 1 x 0.8 x 0.8 cm, T1b N0 stage IA clinical stage      MEDICAL HISTORY:  Past Medical History:  Diagnosis Date  . Abnormal EKG    septal Q waves  . Allergic rhinitis   . Brachial neuritis or radiculitis NOS   . 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 Chickenpox   . GERD (gastroesophageal reflux disease)   . Hx of migraines   .  Hyperlipidemia   . Leiomyoma of uterus, unspecified   . MVP (mitral valve prolapse)   . Right-sided sensorineural hearing loss   . Temporomandibular joint disorders, unspecified     SURGICAL HISTORY: Past Surgical History:  Procedure Laterality Date  . Excision of cervical lymph node anterior cervical chain      SOCIAL HISTORY: Social History   Social History  . Marital status: Single    Spouse name: N/A  . Number of children: N/A  . Years of education: 63   Occupational History  . physician-internist     in locums   Social History Main Topics  . Smoking status: Never Smoker  . Smokeless tobacco: Never Used  . Alcohol use Yes     Comment: occ  . Drug use: No  . Sexual activity: No   Other Topics Concern  . 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 [employer] years 3-4 medical school. 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 Traveled to [place] in Sept '11. Single - has a house in [place] - home base.    FAMILY HISTORY: Family History  Problem Relation Age of Onset  . Rheumatologic disease Mother   . Heart attack Mother     Anterio septal MI '98  . Thyroid disease Mother     hypo  . Hypertension Mother   . Diabetes Father   . Esophageal cancer Father   . Breast cancer Maternal Aunt   . Colon cancer Other   .  Cancer Neg Hx   . Asthma Neg Hx   . Hyperlipidemia Neg Hx     ALLERGIES:  is allergic to aspirin; doxycycline; erythromycin; lansoprazole; and minocin [minocycline hcl].  MEDICATIONS:  Current Outpatient Prescriptions  Medication Sig Dispense Refill  . Ascorbic Acid (VITAMIN C) 500 MG tablet Take 500 mg by mouth daily.      . fenofibrate 160 MG tablet Take 60 mg by mouth 2 (two) times daily as needed.    . fluticasone (FLONASE) 50 MCG/ACT nasal spray Place 2 sprays into the nose daily. 48 g 3  . Multiple Vitamin (MULTIVITAMIN) tablet Take 1 tablet by mouth daily.    .  ranitidine (ZANTAC) 150 MG tablet Take 1 tablet (150 mg total) by mouth 2 (two) times daily. 180 tablet 3  . sodium chloride (OCEAN) 0.65 % SOLN nasal spray Place 2 sprays into both nostrils as needed for congestion.    . vitamin E 400 UNIT capsule Take 400 Units by mouth daily.      Marland Kitchen atorvastatin (LIPITOR) 10 MG tablet ONE TABLET 3 TIMES WEEKLY (Patient not taking: Reported on 01/04/2016) 15 tablet 12  . Coenzyme Q10 (COQ10) 100 MG CAPS Take 100 mg by mouth daily. (Patient not taking: Reported on 01/04/2016) 30 each 12  . cyclobenzaprine (FLEXERIL) 10 MG tablet Take 1 tablet (10 mg total) by mouth 3 (three) times daily as needed for muscle spasms. (Patient not taking: Reported on 01/04/2016) 90 tablet 6  . desoximetasone (TOPICORT) 0.25 % cream Apply topically as directed. (Patient not taking: Reported on 01/04/2016) 30 g 3  . fexofenadine (ALLEGRA) 60 MG tablet Take 1 tablet (60 mg total) by mouth 2 (two) times daily. (Patient not taking: Reported on 01/04/2016) 60 tablet 4  . pantoprazole (PROTONIX) 40 MG tablet Take 1 tablet (40 mg total) by mouth every morning. (Patient not taking: Reported on 01/04/2016) 90 tablet 3  . tiZANidine (ZANAFLEX) 4 MG tablet Take 1 tablet (4 mg total) by mouth 3 (three) times daily. (Patient not taking: Reported on 01/04/2016) 90 tablet 3   No current facility-administered medications for this visit.     REVIEW OF SYSTEMS:   Constitutional: Denies fevers, chills or abnormal night sweats Eyes: Denies blurriness of vision, double vision or watery eyes Ears, nose, mouth, throat, and face: Denies mucositis or sore throat Respiratory: Denies cough, dyspnea or wheezes Cardiovascular: Denies palpitation, chest discomfort or lower extremity swelling Gastrointestinal:  Denies nausea, heartburn or change in bowel habits Skin: Denies abnormal skin rashes Lymphatics: Denies new lymphadenopathy or easy bruising Neurological:Denies numbness, tingling or new weaknesses Behavioral/Psych:  Mood is stable, no new changes  Breast:  Denies any palpable lumps or discharge All other systems were reviewed with the patient and are negative.  PHYSICAL EXAMINATION: ECOG PERFORMANCE STATUS: 1 - Symptomatic but completely ambulatory  Vitals:   01/04/16 0830  BP: 123/72  Pulse: 89  Resp: 18  Temp: 98.8 F (37.1 C)   Filed Weights   01/04/16 0830  Weight: 116 lb 9.6 oz (52.9 kg)    GENERAL:alert, no distress and comfortable SKIN: skin color, texture, turgor are normal, no rashes or significant lesions EYES: normal, conjunctiva are pink and non-injected, sclera clear OROPHARYNX:no exudate, no erythema and lips, buccal mucosa, and tongue normal  NECK: supple, thyroid normal size, non-tender, without nodularity LYMPH:  no palpable lymphadenopathy in the cervical, axillary or inguinal LUNGS: clear to auscultation and percussion with normal breathing effort HEART: regular rate & rhythm and no murmurs and  no lower extremity edema ABDOMEN:abdomen soft, non-tender and normal bowel sounds Musculoskeletal:no cyanosis of digits and no clubbing  PSYCH: alert & oriented x 3 with fluent speech NEURO: no focal motor/sensory deficits BREAST: No palpable nodules in breast. Nipple is slightly inverted. No palpable axillary or supraclavicular lymphadenopathy (exam performed in the presence of a chaperone)   LABORATORY DATA:  I have reviewed the data as listed Lab Results  Component Value Date   WBC 7.3 01/04/2016   HGB 13.8 01/04/2016   HCT 43.0 01/04/2016   MCV 79.9 01/04/2016   PLT 243 01/04/2016   Lab Results  Component Value Date   NA 141 01/04/2016   K 3.8 01/04/2016   CL 105 12/15/2012   CO2 25 01/04/2016    RADIOGRAPHIC STUDIES: I have personally reviewed the radiological reports and agreed with the findings in the report.  ASSESSMENT AND PLAN:  Cancer of central portion of female breast, right Right breast biopsy retroareolar: Invasive lobular cancer with LCIS grade 1  EF 70%, PR 20%, Ki-67 5%, HER-2 negative, ratio 1.17; screening call back for distortion, ultrasound 1 x 0.8 x 0.8 cm, T1b N0 stage IA clinical stage  Pathology and radiology counseling:Discussed with the patient, the details of pathology including the type of breast cancer,the clinical staging, the significance of ER, PR and HER-2/neu receptors and the implications for treatment. After reviewing the pathology in detail, we proceeded to discuss the different treatment options between surgery, radiation, chemotherapy, antiestrogen therapies.  Recommendations: Breast MRI 1. Breast conserving surgery Versus mastectomy 2. Oncotype DX testing to determine if chemotherapy would be of any benefit followed by 3. Adjuvant radiation therapy if she undergoes lumpectomy 4. Adjuvant antiestrogen therapy  Oncotype counseling: I discussed Oncotype DX test. I explained to the patient that this is a 21 gene panel to evaluate patient tumors DNA to calculate recurrence score. This would help determine whether patient has high risk or intermediate risk or low risk breast cancer. She understands that if her tumor was found to be high risk, she would benefit from systemic chemotherapy. If low risk, no need of chemotherapy. If she was found to be intermediate risk, we would need to evaluate the score as well as other risk factors and determine if an abbreviated chemotherapy may be of benefit.  Return to clinic after surgery to discuss final pathology report and then determine if Oncotype DX testing will need to be sent.  All questions were answered. The patient knows to call the clinic with any problems, questions or concerns.    Rulon Eisenmenger, MD 01/04/16

## 2016-01-04 NOTE — Patient Instructions (Signed)

## 2016-01-04 NOTE — H&P (Signed)
Cathy Woodward 01/04/2016 8:00 AM Location: [facility] Patient #: [id number] DOB: 06-17-1960 Undefined / Language: Cathy Woodward / Race: Undefined Female  History of Present Illness Cathy Woodward A. Cornett MD; 01/04/2016 10:26 AM) Patient words: patient presents at the request of [name] right breast cancer.Patient has a history of protraction of her right nipple with itching since April 2017. Patient was evaluated in the multidisciplinary breast clinic. The patient is a 1 cm subareolar mass right breast core biopsy proven to be invasive lobular carcinoma with LCIS ER positive PR positive HER-2/neu negativewith a Ki-67 5%. Patient denies any other breast problems. There is a remote family history of breast cancer. She has occasional right breast pain.  The patient is a 55 year old female.   Other Problems Cathy Slipper, RN; 01/04/2016 8:00 AM) Back Pain Gastroesophageal Reflux Disease Migraine Headache  Past Surgical History Cathy Slipper, RN; 01/04/2016 8:00 AM) Breast Biopsy Right. Hysterectomy (not due to cancer) - Partial  Diagnostic Studies History Cathy Slipper, RN; 01/04/2016 8:00 AM) Colonoscopy 1-5 years ago Mammogram within last year Pap Smear >5 years ago  Medication History Cathy Slipper, RN; 01/04/2016 8:00 AM) Medications Reconciled  Social History Cathy Slipper, RN; 01/04/2016 8:00 AM) Alcohol use Occasional alcohol use. Caffeine use Coffee, Tea. No drug use Tobacco use Never smoker.  Family History Cathy Slipper, RN; 01/04/2016 8:00 AM) Arthritis Mother. Breast Cancer Family Members In General. Cancer Father. Cerebrovascular Accident Family Members In [name]. Colon Polyps Mother. Depression Brother. Diabetes Mellitus Brother, Family Members In General, Father, Mother. Heart Disease Family Members In General. Ischemic Bowel Disease Brother, Mother. Respiratory Condition Brother. Thyroid problems Brother,  Mother.  Pregnancy / Birth History Cathy Slipper, RN; 01/04/2016 8:00 AM) Age at menarche 65 years. Contraceptive History Oral contraceptives.     Review of Systems Cathy Slipper RN; 01/04/2016 8:00 AM) General Present- Night Sweats. Not Present- Appetite Loss, Chills, Fatigue, Fever, Weight Gain and Weight Loss. Skin Not Present- Change in Wart/Mole, Dryness, Hives, Jaundice, New Lesions, Non-Healing Wounds, Rash and Ulcer. HEENT Present- Hearing Loss, Ringing in the Ears, Sinus Pain, Visual Disturbances and Wears glasses/contact lenses. Not Present- Earache, Hoarseness, Nose Bleed, Oral Ulcers, Seasonal Allergies, Sore Throat and Yellow Eyes. Respiratory Present- Snoring. Not Present- Bloody sputum, Chronic Cough, Difficulty Breathing and Wheezing. Breast Present- Breast Pain, Nipple Discharge and Skin Changes. Not Present- Breast Mass. Cardiovascular Not Present- Chest Pain, Difficulty Breathing Lying Down, Leg Cramps, Palpitations, Rapid Heart Rate, Shortness of Breath and Swelling of Extremities. Gastrointestinal Not Present- Abdominal Pain, Bloating, Bloody Stool, Change in Bowel Habits, Chronic diarrhea, Constipation, Difficulty Swallowing, Excessive gas, Gets full quickly at meals, Hemorrhoids, Indigestion, Nausea, Rectal Pain and Vomiting. Female Genitourinary Not Present- Frequency, Nocturia, Painful Urination, Pelvic Pain and Urgency. Musculoskeletal Present- Muscle Weakness. Not Present- Back Pain, Joint Pain, Joint Stiffness, Muscle Pain and Swelling of Extremities. Neurological Not Present- Decreased Memory, Fainting, Headaches, Numbness, Seizures, Tingling, Tremor, Trouble walking and Weakness. Psychiatric Not Present- Anxiety, Bipolar, Change in Sleep Pattern, Depression, Fearful and Frequent crying. Endocrine Present- Hot flashes. Not Present- Cold Intolerance, Excessive Hunger, Hair Changes, Heat Intolerance and New Diabetes. Hematology Not Present- Blood Thinners, Easy Bruising,  Excessive bleeding, Gland problems, HIV and Persistent Infections.   Physical Exam (Thomas A. Cornett MD; 01/04/2016 10:26 AM)  General Mental Status-Alert. General Appearance-Consistent with stated age. Hydration-Well hydrated. Voice-Normal.  Head and Neck Head-normocephalic, atraumatic with no lesions or palpable masses. Trachea-midline. Thyroid Gland Characteristics - normal size and consistency.  Eye Eyeball - Bilateral-Extraocular movements intact. Sclera/Conjunctiva - Bilateral-No scleral icterus.  Chest and Lung Exam Chest and lung exam reveals -quiet, even and easy respiratory effort with no use of accessory muscles and on auscultation, normal breath sounds, no adventitious sounds and normal vocal resonance. Inspection Chest Wall - Normal. Back - normal.  Breast Note: right breast with retracted nipple. Foley was nipple noted on the right. No other right breast masses. Left breast is normal.  Cardiovascular Cardiovascular examination reveals -normal heart sounds, regular rate and rhythm with no murmurs and normal pedal pulses bilaterally.  Neurologic Neurologic evaluation reveals -alert and oriented x 3 with no impairment of recent or remote memory. Mental Status-Normal.  Musculoskeletal Normal Exam - Left-Upper Extremity Strength Normal and Lower Extremity Strength Normal. Normal Exam - Right-Upper Extremity Strength Normal and Lower Extremity Strength Normal.  Lymphatic Head & Neck  General Head & Neck Lymphatics: Bilateral - Description - Normal. Axillary  General Axillary Region: Bilateral - Description - Normal. Tenderness - Non Tender.    Assessment & Plan (Zaylon Bossier A. Erian Rosengren MD; 01/04/2016 10:28 AM)  BREAST CANCER, RIGHT (C50.911) Impression: patient requires MRI secondary to lobular phenotype. Discussed breast .onservation versus mastectomy with reconstruction. Patient desires right breast lumpectomy and this will include  the nipple. Patient requires sentinel lymph node mapping. Risk of lumpectomy include bleeding, infection, seroma, more surgery, use of seed/wire, wound care, cosmetic deformity and the need for other treatments, death , blood clots, death. Pt agrees to proceed. Risk of sentinel lymph node mapping include bleeding, infection, lymphedema, shoulder pain. stiffness, dye allergy. cosmetic deformity , blood clots, death, need for more surgery. Pt agres to proceed.  Current Plans You are being scheduled for surgery - Our schedulers will call you.  You should hear from our office's scheduling department within 5 working days about the location, date, and time of surgery. We try to make accommodations for patient's preferences in scheduling surgery, but sometimes the OR schedule or the surgeon's schedule prevents [place] from making those accommodations.  If you have not heard from our office [phone]) in 5 working days, call the office and ask for your surgeon's nurse.  If you have other questions about your diagnosis, plan, or surgery, call the office and ask for your surgeon's nurse.  Pt Education - [facility] Breast Cancer Information Given - [facility] "Breast Journey" Package We discussed the staging and pathophysiology of breast cancer. We discussed all of the different options for treatment for breast cancer including surgery, chemotherapy, radiation therapy, Herceptin, and antiestrogen therapy. We discussed a sentinel lymph node biopsy as she does not appear to having lymph node involvement right now. We discussed the performance of that with injection of radioactive tracer and blue dye. We discussed that she would have an incision underneath her axillary hairline. We discussed that there is a bout a 10-20% chance of having a positive node with a sentinel lymph node biopsy and we will await the permanent pathology to make any other first further decisions in terms of her treatment. One of these options might be to  return to the operating room to perform an axillary lymph node dissection. We discussed about a 1-2% risk lifetime of chronic shoulder pain as well as lymphedema associated with a sentinel lymph node biopsy. We discussed the options for treatment of the breast cancer which included lumpectomy versus a mastectomy. We discussed the performance of the lumpectomy with a wire placement. We discussed a 10-20% chance of a positive margin requiring reexcision  in the operating room. We also discussed that she may need radiation therapy or antiestrogen therapy or both if she undergoes lumpectomy. We discussed the mastectomy and the postoperative care for that as well. We discussed that there is no difference in her survival whether she undergoes lumpectomy with radiation therapy or antiestrogen therapy versus a mastectomy. There is a slight difference in the local recurrence rate being 3-5% with lumpectomy and about 1% with a mastectomy. We discussed the risks of operation including bleeding, infection, possible reoperation. She understands her further therapy will be based on what her stages at the time of her operation.  Pt Education - [facility] breast cancer surgery: discussed with patient and provided information. Pt Education - [facility] Breast Biopsy HCI: discussed with patient and provided information. Pt Education - ABC (After Breast Cancer) Class Info: discussed with patient and provided information.

## 2016-01-04 NOTE — Progress Notes (Signed)
Clinical Social Work [place] Psychosocial Distress Screening [place]  Patient completed distress screening protocol and scored a 1 on the Psychosocial Distress Thermometer which indicates mild distress. Clinical Social Worker met with patient in [facility] to assess for distress and other psychosocial needs. Patient stated she was feeling "at peace" after meeting with the treatment team and getting more information on her treatment plan. CSW and patient discussed common feeling and emotions when being diagnosed with cancer, and the importance of support during treatment. CSW informed patient of the support team and support services at [facility]. CSW provided contact information and encouraged patient to call with any questions or concerns.   ONCBCN DISTRESS SCREENING 01/04/2016  Screening Type Initial Screening  Distress experienced in past week (1-10) 1  Practical problem type Work/school    Johnnye Lana, MSW, LCSW, OSW-C Clinical Social Worker [place] [phone]

## 2016-01-04 NOTE — Progress Notes (Signed)
Radiation Oncology         ([phone]) [phone] ________________________________  Name: Cathy Woodward MRN: [id number]  Date: 01/04/2016  DOB: 1960-09-24  KG:[id number] A [name], MD  [name], *     REFERRING PHYSICIAN: Pricilla Holm A, *   DIAGNOSIS: The encounter diagnosis was Cancer of central portion of female breast, right.   HISTORY OF PRESENT ILLNESS: Cathy Woodward is a 55 y.o. female with a new diagnosis of right breast cancer. A screening mammogram in April 2017 revealed possible distortion of the right breast. She was working at that time out of state and when she returned, went for diagnostic right mammogram on 12/28/15 which revealed persistent architectural distortion of the right breast, an ultrasound revealed nipple retraction and a area of focal shadowing measuring 10 x 8 x 8 mm in the retroareolar region. 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 She comes today to discuss options for radiotherapy as a part of her cancer care with Dr. Lisbeth Renshaw.   PREVIOUS RADIATION THERAPY: No   PAST MEDICAL HISTORY:  Past Medical History:  Diagnosis Date  . Abnormal EKG    septal Q waves  . Allergic rhinitis   . Brachial neuritis or radiculitis NOS   . 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 Chickenpox   . GERD (gastroesophageal reflux disease)   . Hx of migraines   . Hyperlipidemia   . Leiomyoma of uterus, unspecified   . MVP (mitral valve prolapse)   . Right-sided sensorineural hearing loss   . Temporomandibular joint disorders, unspecified        PAST SURGICAL HISTORY: Past Surgical History:  Procedure Laterality Date  . Excision of cervical lymph node anterior cervical chain       FAMILY HISTORY:  Family History  Problem Relation Age of Onset  . Rheumatologic disease Mother   . Heart attack Mother     Anterio septal MI '98  . Thyroid disease Mother     hypo  . Hypertension Mother   . Diabetes  Father   . Breast cancer Maternal Aunt   . Colon cancer Other   . Cancer Neg Hx   . Asthma Neg Hx   . Hyperlipidemia Neg Hx      SOCIAL HISTORY:  reports that she has never smoked. She has never used smokeless tobacco. She reports that she drinks alcohol. She reports that she does not use drugs. 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 She is single and lives in [place], and is originally from [place].   ALLERGIES: Aspirin; Doxycycline; Erythromycin; Lansoprazole; and Minocin [minocycline hcl]   MEDICATIONS:  Current Outpatient Prescriptions  Medication Sig Dispense Refill  . Ascorbic Acid (VITAMIN C) 500 MG tablet Take 500 mg by mouth daily.      Marland Kitchen atorvastatin (LIPITOR) 10 MG tablet ONE TABLET 3 TIMES WEEKLY 15 tablet 12  . Coenzyme Q10 (COQ10) 100 MG CAPS Take 100 mg by mouth daily. 30 each 12  . cyclobenzaprine (FLEXERIL) 10 MG tablet Take 1 tablet (10 mg total) by mouth 3 (three) times daily as needed for muscle spasms. 90 tablet 6  . desoximetasone (TOPICORT) 0.25 % cream Apply topically as directed. 30 g 3  . fexofenadine (ALLEGRA) 60 MG tablet Take 1 tablet (60 mg total) by mouth 2 (two) times daily. 60 tablet 4  . fluticasone (FLONASE) 50 MCG/ACT nasal spray Place 2 sprays into  the nose daily. 48 g 3  . Multiple Vitamin (MULTIVITAMIN) tablet Take 1 tablet by mouth daily.    . pantoprazole (PROTONIX) 40 MG tablet Take 1 tablet (40 mg total) by mouth every morning. (Patient taking differently: Take 40 mg by mouth as needed. ) 90 tablet 3  . ranitidine (ZANTAC) 150 MG tablet Take 1 tablet (150 mg total) by mouth 2 (two) times daily. 180 tablet 3  . tiZANidine (ZANAFLEX) 4 MG tablet Take 1 tablet (4 mg total) by mouth 3 (three) times daily. 90 tablet 3  . vitamin E 400 UNIT capsule Take 400 Units by mouth daily.       No current facility-administered medications for this encounter.      REVIEW OF SYSTEMS: On review of  systems, the patient reports that she is doing well overall. She denies any chest pain, shortness of breath, cough, fevers, chills, night sweats, unintended weight changes. She denies any bowel or bladder disturbances, and denies nausea or vomiting. She occasionally notes abdominal twinges of discomfort and attributes this to her previous history of hysterectomy. She denies any new musculoskeletal or joint aches or pains. A complete review of systems is obtained and is otherwise negative.     PHYSICAL EXAM:  Pain scale 0/10 In general this is a well appearing [place] female in no acute distress. She is alert and oriented x4 and appropriate throughout the examination. HEENT reveals that the patient is normocephalic, atraumatic. EOMs are intact. PERRLA. Skin is intact without any evidence of gross lesions. Cardiovascular exam reveals a regular rate and rhythm, no clicks rubs or murmurs are auscultated. Chest is clear to auscultation bilaterally. Lymphatic assessment is performed and does not reveal any adenopathy in the cervical, supraclavicular, axillary, or inguinal chains. The right breast is examined and has an intact steri-strip over the biopsy site. She does have minimal retraction of the right nipple. No discharge or bleeding is noted and mild ecchymosis is seen. The left breast is intact without palpable mass. No nipple bleeding or discharge is noted. Abdomen has active bowel sounds in all quadrants and is intact. The abdomen is soft, non tender, non distended. Lower extremities are negative for pretibial pitting edema, deep calf tenderness, cyanosis or clubbing.   ECOG = 0  0 - Asymptomatic (Fully active, able to carry on all predisease activities without restriction)  1 - Symptomatic but completely ambulatory (Restricted in physically strenuous activity but ambulatory and able to carry out work of a light or sedentary nature. For example, light housework, office work)  2 - Symptomatic, <50%  in bed during the day (Ambulatory and capable of all self care but unable to carry out any work activities. Up and about more than 50% of waking hours)  3 - Symptomatic, >50% in bed, but not bedbound (Capable of only limited self-care, confined to bed or chair 50% or more of waking hours)  4 - Bedbound (Completely disabled. Cannot carry on any self-care. Totally confined to bed or chair)  5 - Death   Eustace Pen MM, Creech RH, Tormey DC, et al. [phone]). "Toxicity and response criteria of the [facility]". [place] Oncol. 5 (6): 649-55    LABORATORY DATA:  Lab Results  Component Value Date   WBC 6.5 12/15/2012   HGB 13.2 12/15/2012   HCT 39.9 12/15/2012   MCV 80.1 12/15/2012   PLT 229.0 12/15/2012   Lab Results  Component Value Date   NA 140 12/15/2012   K  3.6 12/15/2012   CL 105 12/15/2012   CO2 30 12/15/2012   Lab Results  Component Value Date   ALT 17 12/15/2012   ALT 17 12/15/2012   AST 19 12/15/2012   AST 19 12/15/2012   ALKPHOS 67 12/15/2012   ALKPHOS 67 12/15/2012   BILITOT 0.6 12/15/2012   BILITOT 0.6 12/15/2012      RADIOGRAPHY: Mm Digital Diagnostic Unilat R  Result Date: 12/28/2015 CLINICAL DATA:  Status post ultrasound-guided core needle biopsy of a retroareolar mass and architectural distortion. EXAM: DIAGNOSTIC RIGHT MAMMOGRAM POST ULTRASOUND BIOPSY COMPARISON:  Previous exam(s). FINDINGS: Mammographic images were obtained following ultrasound guided biopsy of a retroareolar right breast hypoechoic mass. The coil shaped biopsy clip lies in the expected location of the mass and within the area of architectural distortion. IMPRESSION: Well-positioned coil shaped biopsy clip following ultrasound-guided core needle biopsy of the right breast. Final Assessment: Post Procedure Mammograms for Marker Placement Electronically Signed   By: Lajean Manes M.D.   On: 12/28/2015 12:05   US Breast Ltd Uni Right Inc Axilla  Result Date: 12/28/2015 CLINICAL  DATA:  Screening recall for an architectural distortion in the retroareolar right breast. EXAM: 2D DIGITAL DIAGNOSTIC RIGHT MAMMOGRAM WITH ADJUNCT TOMO ULTRASOUND RIGHT BREAST COMPARISON:  Previous exam(s). ACR Breast Density Category c: The breast tissue is heterogeneously dense, which may obscure small masses. FINDINGS: The architectural distortion in the retroareolar region of the right breast persists on spot-compression diagnostic images. There is no defined mass. There are no suspicious calcifications. There is nipple retraction. On physical exam, the nipples mildly contracted. There is firm nodular breast tissue in the retroareolar region without a discrete defined mass. Targeted ultrasound is performed, showing an area of vague focal shadowing in the immediate retroareolar region of the right breast. This measures approximately 10 x 8 x 8 mm. Sonographic evaluation of the right axilla shows no enlarged or abnormal lymph nodes. IMPRESSION: Suspicious finding. Nipple retraction and the underlying retroareolar architectural distortion along with the ill-defined focal hypoechoic retroareolar lesion on ultrasound worrisome for breast carcinoma. RECOMMENDATION: Patient will undergo ultrasound-guided core needle biopsy today. If results are benign, patient was still needed surgical excision in the architectural distortion. I have discussed the findings and recommendations with the patient. Results were also provided in writing at the conclusion of the visit. If applicable, a reminder letter will be sent to the patient regarding the next appointment. BI-RADS CATEGORY  5: Highly suggestive of malignancy. Electronically Signed   By: Lajean Manes M.D.   On: 12/28/2015 12:03   Mm Diag Breast Tomo Uni Right  Result Date: 12/28/2015 CLINICAL DATA:  Screening recall for an architectural distortion in the retroareolar right breast. EXAM: 2D DIGITAL DIAGNOSTIC RIGHT MAMMOGRAM WITH ADJUNCT TOMO ULTRASOUND RIGHT BREAST  COMPARISON:  Previous exam(s). ACR Breast Density Category c: The breast tissue is heterogeneously dense, which may obscure small masses. FINDINGS: The architectural distortion in the retroareolar region of the right breast persists on spot-compression diagnostic images. There is no defined mass. There are no suspicious calcifications. There is nipple retraction. On physical exam, the nipples mildly contracted. There is firm nodular breast tissue in the retroareolar region without a discrete defined mass. Targeted ultrasound is performed, showing an area of vague focal shadowing in the immediate retroareolar region of the right breast. This measures approximately 10 x 8 x 8 mm. Sonographic evaluation of the right axilla shows no enlarged or abnormal lymph nodes. IMPRESSION: Suspicious finding. Nipple retraction and the underlying retroareolar  architectural distortion along with the ill-defined focal hypoechoic retroareolar lesion on ultrasound worrisome for breast carcinoma. RECOMMENDATION: Patient will undergo ultrasound-guided core needle biopsy today. If results are benign, patient was still needed surgical excision in the architectural distortion. I have discussed the findings and recommendations with the patient. Results were also provided in writing at the conclusion of the visit. If applicable, a reminder letter will be sent to the patient regarding the next appointment. BI-RADS CATEGORY  5: Highly suggestive of malignancy. Electronically Signed   By: Lajean Manes M.D.   On: 12/28/2015 12:03   [place] Rt Breast Bx W Loc Dev 1st Lesion Img Bx Spec US Guide  Addendum Date: 12/29/2015   ADDENDUM REPORT: 12/29/2015 14:29 ADDENDUM: Pathology revealed GRADE I INVASIVE MAMMARY CARCINOMA, MAMMARY CARCINOMA IN SITU of the Right breast, retroareolar. This was found to be concordant by Dr. Lajean Manes. Pathology results were discussed with the patient by telephone. The patient reported doing well after the biopsy with  tenderness at the site. Post biopsy instructions and care were reviewed and questions were answered. The patient was encouraged to call [facility] for any additional concerns. The patient was referred to [facility] on January 04, 2016. Pathology results reported by Terie Purser, RN on 12/29/2015. Electronically Signed   By: Lajean Manes M.D.   On: 12/29/2015 14:29   Result Date: 12/29/2015 CLINICAL DATA:  Patient presents for ultrasound-guided tip core needle biopsy of a ill-defined focal area of hypo echogenicity just below the nipple which corresponds to architectural distortion mammographically. EXAM: ULTRASOUND GUIDED RIGHT BREAST CORE NEEDLE BIOPSY COMPARISON:  Previous exam(s). FINDINGS: I met with the patient and we discussed the procedure of ultrasound-guided biopsy, including benefits and alternatives. We discussed the high likelihood of a successful procedure. We discussed the risks of the procedure, including infection, bleeding, tissue injury, clip migration, and inadequate sampling. Informed written consent was given. The usual time-out protocol was performed immediately prior to the procedure. Using sterile technique and 1% Lidocaine as local anesthetic, under direct ultrasound visualization, a 12 gauge spring-loaded device was used to perform biopsy of the retroareolar hypoechoic mass using an inferior approach. At the conclusion of the procedure a coil shaped tissue marker clip was deployed into the biopsy cavity. Follow up 2 view mammogram was performed and dictated separately. IMPRESSION: Ultrasound guided biopsy of the right breast. No apparent complications. Electronically Signed: By: Lajean Manes M.D. 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   PLAN: Dr. Lisbeth Renshaw discusses the pathology  findings and reviews the nature of invasive and in situ disease. The consensus from the breast conference include MRI of the breast with breast conservation with lumpectomy and sentinel mapping versus mastectomy. Provided that chemotherapy is not indicated based on oncotype testing, the patient's course if she undergoes lumpectomy, would then be followed by external radiotherapy to the breast followed by antiestrogen therapy. Dr. Lisbeth Renshaw discusses 33 fractions of radiotherapy over the course of 6 1/2 weeks. We discussed the risks, benefits, short, and long term effects of radiotherapy, and the patient is interested in proceeding. Dr. Lisbeth Renshaw discusses the delivery and logistics of radiotherapy. We will see her back about 2 weeks after surgery to move forward with the simulation and planning process and anticipate starting radiotherapy about 4 weeks after surgery.  The above documentation reflects my direct findings during this shared patient visit. Please see the separate note by Dr. Lisbeth Renshaw on this date for the remainder of the patient's plan of care.    Carola Rhine, PAC

## 2016-01-04 NOTE — Assessment & Plan Note (Signed)
Right breast biopsy retroareolar: Invasive lobular cancer with LCIS grade 1 EF 70%, PR 20%, Ki-67 5%, HER-2 negative, ratio 1.17; screening call back for distortion, ultrasound 1 x 0.8 x 0.8 cm, T1b N0 stage IA clinical stage  Pathology and radiology counseling:Discussed with the patient, the details of pathology including the type of breast cancer,the clinical staging, the significance of ER, PR and HER-2/neu receptors and the implications for treatment. After reviewing the pathology in detail, we proceeded to discuss the different treatment options between surgery, radiation, chemotherapy, antiestrogen therapies.  Recommendations: Breast MRI 1. Breast conserving surgery followed by 2. Oncotype DX testing to determine if chemotherapy would be of any benefit followed by 3. Adjuvant radiation therapy followed by 4. Adjuvant antiestrogen therapy  Oncotype counseling: I discussed Oncotype DX test. I explained to the patient that this is a 21 gene panel to evaluate patient tumors DNA to calculate recurrence score. This would help determine whether patient has high risk or intermediate risk or low risk breast cancer. She understands that if her tumor was found to be high risk, she would benefit from systemic chemotherapy. If low risk, no need of chemotherapy. If she was found to be intermediate risk, we would need to evaluate the score as well as other risk factors and determine if an abbreviated chemotherapy may be of benefit.  Return to clinic after surgery to discuss final pathology report and then determine if Oncotype DX testing will need to be sent.

## 2016-01-05 ENCOUNTER — Ambulatory Visit: Payer: Self-pay | Admitting: Internal Medicine

## 2016-01-05 ENCOUNTER — Encounter: Payer: Self-pay | Admitting: Internal Medicine

## 2016-01-05 ENCOUNTER — Telehealth: Payer: Self-pay | Admitting: Geriatric Medicine

## 2016-01-05 ENCOUNTER — Ambulatory Visit (INDEPENDENT_AMBULATORY_CARE_PROVIDER_SITE_OTHER): Payer: Self-pay | Admitting: Internal Medicine

## 2016-01-05 DIAGNOSIS — Z0000 Encounter for general adult medical examination without abnormal findings: Secondary | ICD-10-CM

## 2016-01-05 NOTE — Telephone Encounter (Signed)
Waiting for nurse from the cancer center to call back to let us know if they can add on A1c and lipid panel to the labs that were drawn yesterday so patient does not have to get stuck twice.

## 2016-01-05 NOTE — Patient Instructions (Addendum)
We will see if we can add on the labs to your blood work.   If not we will send you a message on mychart.   Good luck with the surgery and I will keep you in my thoughts.   Health Maintenance, Female Adopting a healthy lifestyle and getting preventive care can go a long way to promote health and wellness. Talk with your health care provider about what schedule of regular examinations is right for you. This is a good chance for you to check in with your provider about disease prevention and staying healthy. In between checkups, there are plenty of things you can do on your own. Experts have done a lot of research about which lifestyle changes and preventive measures are most likely to keep you healthy. Ask your health care provider for more information. WEIGHT AND DIET  Eat a healthy diet  Be sure to include plenty of vegetables, fruits, low-fat dairy products, and lean protein.  Do not eat a lot of foods high in solid fats, added sugars, or salt.  Get regular exercise. This is one of the most important things you can do for your health.  Most adults should exercise for at least 150 minutes each week. The exercise should increase your heart rate and make you sweat (moderate-intensity exercise).  Most adults should also do strengthening exercises at least twice a week. This is in addition to the moderate-intensity exercise.  Maintain a healthy weight  Body mass index (BMI) is a measurement that can be used to identify possible weight problems. It estimates body fat based on height and weight. Your health care provider can help determine your BMI and help you achieve or maintain a healthy weight.  For females 95 years of age and older:   A BMI below 18.5 is considered underweight.  A BMI of 18.5 to 24.9 is normal.  A BMI of 25 to 29.9 is considered overweight.  A BMI of 30 and above is considered obese.  Watch levels of cholesterol and blood lipids  You should start having your  blood tested for lipids and cholesterol at 55 years of age, then have this test every 5 years.  You may need to have your cholesterol levels checked more often if:  Your lipid or cholesterol levels are high.  You are older than 56 years of age.  You are at high risk for heart disease.  CANCER SCREENING   Lung Cancer  Lung cancer screening is recommended for adults 44-50 years old who are at high risk for lung cancer because of a history of smoking.  A yearly low-dose CT scan of the lungs is recommended for people who:  Currently smoke.  Have quit within the past 15 years.  Have at least a 30-pack-year history of smoking. A pack year is smoking an average of one pack of cigarettes a day for 1 year.  Yearly screening should continue until it has been 15 years since you quit.  Yearly screening should stop if you develop a health problem that would prevent you from having lung cancer treatment.  Breast Cancer  Practice breast self-awareness. This means understanding how your breasts normally appear and feel.  It also means doing regular breast self-exams. Let your health care provider know about any changes, no matter how small.  If you are in your 20s or 30s, you should have a clinical breast exam (CBE) by a health care provider every 1-3 years as part of a regular health exam.  If you are 40 or older, have a CBE every year. Also consider having a breast X-ray (mammogram) every year.  If you have a family history of breast cancer, talk to your health care provider about genetic screening.  If you are at high risk for breast cancer, talk to your health care provider about having an MRI and a mammogram every year.  Breast cancer gene (BRCA) assessment is recommended for women who have family members with BRCA-related cancers. BRCA-related cancers include:  Breast.  Ovarian.  Tubal.  Peritoneal cancers.  Results of the assessment will determine the need for genetic  counseling and BRCA1 and BRCA2 testing. Cervical Cancer Your health care provider may recommend that you be screened regularly for cancer of the pelvic organs (ovaries, uterus, and vagina). This screening involves a pelvic examination, including checking for microscopic changes to the surface of your cervix (Pap test). You may be encouraged to have this screening done every 3 years, beginning at age 60.  For women ages 62-65, health care providers may recommend pelvic exams and Pap testing every 3 years, or they may recommend the Pap and pelvic exam, combined with testing for human papilloma virus (HPV), every 5 years. Some types of HPV increase your risk of cervical cancer. Testing for HPV may also be done on women of any age with unclear Pap test results.  Other health care providers may not recommend any screening for nonpregnant women who are considered low risk for pelvic cancer and who do not have symptoms. Ask your health care provider if a screening pelvic exam is right for you.  If you have had past treatment for cervical cancer or a condition that could lead to cancer, you need Pap tests and screening for cancer for at least 20 years after your treatment. If Pap tests have been discontinued, your risk factors (such as having a new sexual partner) need to be reassessed to determine if screening should resume. Some women have medical problems that increase the chance of getting cervical cancer. In these cases, your health care provider may recommend more frequent screening and Pap tests. Colorectal Cancer  This type of cancer can be detected and often prevented.  Routine colorectal cancer screening usually begins at 56 years of age and continues through 57 years of age.  Your health care provider may recommend screening at an earlier age if you have risk factors for colon cancer.  Your health care provider may also recommend using home test kits to check for hidden blood in the stool.  A  small camera at the end of a tube can be used to examine your colon directly (sigmoidoscopy or colonoscopy). This is done to check for the earliest forms of colorectal cancer.  Routine screening usually begins at age 40.  Direct examination of the colon should be repeated every 5-10 years through 56 years of age. However, you may need to be screened more often if early forms of precancerous polyps or small growths are found. Skin Cancer  Check your skin from head to toe regularly.  Tell your health care provider about any new moles or changes in moles, especially if there is a change in a mole's shape or color.  Also tell your health care provider if you have a mole that is larger than the size of a pencil eraser.  Always use sunscreen. Apply sunscreen liberally and repeatedly throughout the day.  Protect yourself by wearing long sleeves, pants, a wide-brimmed hat, and sunglasses whenever you  are outside. HEART DISEASE, DIABETES, AND HIGH BLOOD PRESSURE   High blood pressure causes heart disease and increases the risk of stroke. High blood pressure is more likely to develop in:  People who have blood pressure in the high end of the normal range (130-139/85-89 mm Hg).  People who are overweight or obese.  People who are African American.  If you are 43-36 years of age, have your blood pressure checked every 3-5 years. If you are 76 years of age or older, have your blood pressure checked every year. You should have your blood pressure measured twice--once when you are at a hospital or clinic, and once when you are not at a hospital or clinic. Record the average of the two measurements. To check your blood pressure when you are not at a hospital or clinic, you can use:  An automated blood pressure machine at a pharmacy.  A home blood pressure monitor.  If you are between 44 years and 62 years old, ask your health care provider if you should take aspirin to prevent strokes.  Have  regular diabetes screenings. This involves taking a blood sample to check your fasting blood sugar level.  If you are at a normal weight and have a low risk for diabetes, have this test once every three years after 56 years of age.  If you are overweight and have a high risk for diabetes, consider being tested at a younger age or more often. PREVENTING INFECTION  Hepatitis B  If you have a higher risk for hepatitis B, you should be screened for this virus. You are considered at high risk for hepatitis B if:  You were born in a country where hepatitis B is common. Ask your health care provider which countries are considered high risk.  Your parents were born in a high-risk country, and you have not been immunized against hepatitis B (hepatitis B vaccine).  You have HIV or AIDS.  You use needles to inject street drugs.  You live with someone who has hepatitis B.  You have had sex with someone who has hepatitis B.  You get hemodialysis treatment.  You take certain medicines for conditions, including cancer, organ transplantation, and autoimmune conditions. Hepatitis C  Blood testing is recommended for:  Everyone born from [birth year] through 1966.  Anyone with known risk factors for hepatitis C. Sexually transmitted infections (STIs)  You should be screened for sexually transmitted infections (STIs) including gonorrhea and chlamydia if:  You are sexually active and are younger than 56 years of age.  You are older than 56 years of age and your health care provider tells you that you are at risk for this type of infection.  Your sexual activity has changed since you were last screened and you are at an increased risk for chlamydia or gonorrhea. Ask your health care provider if you are at risk.  If you do not have HIV, but are at risk, it may be recommended that you take a prescription medicine daily to prevent HIV infection. This is called pre-exposure prophylaxis (PrEP). You are  considered at risk if:  You are sexually active and do not regularly use condoms or know the HIV status of your partner(s).  You take drugs by injection.  You are sexually active with a partner who has HIV. Talk with your health care provider about whether you are at high risk of being infected with HIV. If you choose to begin PrEP, you should first be tested for  HIV. You should then be tested every 3 months for as long as you are taking PrEP.  PREGNANCY   If you are premenopausal and you may become pregnant, ask your health care provider about preconception counseling.  If you may become pregnant, take 400 to 800 micrograms (mcg) of folic acid every day.  If you want to prevent pregnancy, talk to your health care provider about birth control (contraception). OSTEOPOROSIS AND MENOPAUSE   Osteoporosis is a disease in which the bones lose minerals and strength with aging. This can result in serious bone fractures. Your risk for osteoporosis can be identified using a bone density scan.  If you are 80 years of age or older, or if you are at risk for osteoporosis and fractures, ask your health care provider if you should be screened.  Ask your health care provider whether you should take a calcium or vitamin D supplement to lower your risk for osteoporosis.  Menopause may have certain physical symptoms and risks.  Hormone replacement therapy may reduce some of these symptoms and risks. Talk to your health care provider about whether hormone replacement therapy is right for you.  HOME CARE INSTRUCTIONS   Schedule regular health, dental, and eye exams.  Stay current with your immunizations.   Do not use any tobacco products including cigarettes, chewing tobacco, or electronic cigarettes.  If you are pregnant, do not drink alcohol.  If you are breastfeeding, limit how much and how often you drink alcohol.  Limit alcohol intake to no more than 1 drink per day for nonpregnant women. One  drink equals 12 ounces of beer, 5 ounces of wine, or 1 ounces of hard liquor.  Do not use street drugs.  Do not share needles.  Ask your health care provider for help if you need support or information about quitting drugs.  Tell your health care provider if you often feel depressed.  Tell your health care provider if you have ever been abused or do not feel safe at home.   This information is not intended to replace advice given to you by your health care provider. Make sure you discuss any questions you have with your health care provider.   Document Released: 11/27/2010 Document Revised: 06/04/2014 Document Reviewed: 04/15/2013 Elsevier Interactive Patient Education [employer].

## 2016-01-05 NOTE — Progress Notes (Signed)
Pre visit review using our clinic review tool, if applicable. No additional management support is needed unless otherwise documented below in the visit note. 

## 2016-01-05 NOTE — Assessment & Plan Note (Signed)
Colonoscopy due 2022, will check lipid panel and HgA1c other labs normal. Tdap up to date. Will get flu shot in October. Counseled about the dangers of distracted driving. Given health screening information.

## 2016-01-05 NOTE — Progress Notes (Signed)
   Subjective:    Patient ID: Cathy Woodward, female    DOB: 1961/03/25, 55 y.o.   MRN: [id number]  HPI The patient is a 55 YO female coming in for wellness. Recent mammogram with area of concern and biopsy suggestive of cancer and she is in process of scheduling for lumpectomy. (ER, PR positive).  PMH, [facility], social history reviewed and updated.   Review of Systems  Constitutional: Negative for activity change, appetite change, chills, fatigue, fever and unexpected weight change.  HENT: Positive for postnasal drip and rhinorrhea. Negative for congestion.        Chronic, stable  Eyes: Negative.   Respiratory: Negative for cough, shortness of breath and wheezing.   Cardiovascular: Negative for chest pain, palpitations and leg swelling.  Gastrointestinal: Negative for abdominal distention, abdominal pain, constipation, diarrhea and nausea.  Musculoskeletal: Negative for arthralgias, back pain and myalgias.  Skin: Negative.   Neurological: Negative.   Psychiatric/Behavioral: Negative.       Objective:   Physical Exam  Constitutional: She is oriented to person, place, and time. She appears well-developed and well-nourished.  HENT:  Head: Normocephalic and atraumatic.  Eyes: EOM are normal.  Neck: Normal range of motion.  Cardiovascular: Normal rate and regular rhythm.   Murmur heard. Pulmonary/Chest: Effort normal and breath sounds normal.  Abdominal: Soft. Bowel sounds are normal. She exhibits no distension. There is no tenderness.  Neurological: She is alert and oriented to person, place, and time. Coordination normal.  Skin: Skin is warm and dry.  Psychiatric: She has a normal mood and affect.   Vitals:   01/05/16 0814  BP: (!) 102/58  Pulse: 99  Resp: 14  Temp: 98.6 F (37 C)  TempSrc: Oral  SpO2: 98%  Weight: 115 lb 6.4 oz (52.3 kg)  Height: 5' 7.5" (1.715 m)      Assessment & Plan:

## 2016-01-06 ENCOUNTER — Encounter: Payer: Self-pay | Admitting: Internal Medicine

## 2016-01-06 ENCOUNTER — Telehealth: Payer: Self-pay

## 2016-01-06 ENCOUNTER — Ambulatory Visit
Admission: RE | Admit: 2016-01-06 | Discharge: 2016-01-06 | Disposition: A | Discharge status: Home / Self Care | Payer: Self-pay | Source: Ambulatory Visit | Attending: Hematology and Oncology | Admitting: Hematology and Oncology

## 2016-01-06 DIAGNOSIS — C50111 Malignant neoplasm of central portion of right female breast: Secondary | ICD-10-CM

## 2016-01-06 MED ORDER — GADOBENATE DIMEGLUMINE 529 MG/ML IV SOLN
10.0000 mL | Freq: Once | INTRAVENOUS | Status: AC | PRN
  Administered 2016-01-06: 10 mL via INTRAVENOUS

## 2016-01-06 NOTE — Telephone Encounter (Signed)
Received request from Dr. Lindi Adie to find out if HbA1C and lipid panel could be added on to labs obtained on 01/04/16 per request of pts PCP.  Called and spoke with Kennyth Lose in our lab who stated neither test could be added on to the samples obtained on Wednesday.  Called pts PCP office [facility]) back at call back number given and never able to get through to anyone to relay this information.  Attempted on 8/10 and 8/11 and phone continued to ring with no pick up.

## 2016-01-09 ENCOUNTER — Encounter: Payer: Self-pay | Admitting: Internal Medicine

## 2016-01-09 ENCOUNTER — Other Ambulatory Visit: Payer: Self-pay | Admitting: Internal Medicine

## 2016-01-09 DIAGNOSIS — Z0000 Encounter for general adult medical examination without abnormal findings: Secondary | ICD-10-CM

## 2016-01-10 ENCOUNTER — Encounter: Payer: Self-pay | Admitting: Hematology and Oncology

## 2016-01-10 ENCOUNTER — Telehealth: Payer: Self-pay | Admitting: *Deleted

## 2016-01-10 ENCOUNTER — Encounter: Payer: Self-pay | Admitting: Internal Medicine

## 2016-01-10 NOTE — Progress Notes (Signed)
As of today, no treatment -chemo ordered

## 2016-01-10 NOTE — Telephone Encounter (Signed)
Left message for a return phone call to follow from [facility] 8/16.

## 2016-01-11 ENCOUNTER — Ambulatory Visit: Payer: Self-pay | Admitting: Surgery

## 2016-01-11 DIAGNOSIS — C50911 Malignant neoplasm of unspecified site of right female breast: Secondary | ICD-10-CM

## 2016-01-20 ENCOUNTER — Telehealth: Payer: Self-pay | Admitting: Hematology and Oncology

## 2016-01-20 NOTE — Telephone Encounter (Signed)
lvm to inform pt of 9/18 appt date/time per LOS

## 2016-01-23 NOTE — Pre-Procedure Instructions (Signed)
Cathy Woodward  01/23/2016      [facility] [address] [place], [place] [address] Phone: [phone] Fax: [phone]    Your procedure is scheduled on Thursday September 9.  Report to [facility] Admitting at 8:00 A.M.  Call this number if you have problems the morning of surgery:  [phone]   Remember:  Do not eat food or drink liquids after midnight.  Take these medicines the morning of surgery with A SIP OF WATER: cyclobenzaprine (flexeril), ranitidine (zantac), flonase if needed  7 days prior to surgery STOP taking any Aspirin, Aleve, Naproxen, Ibuprofen, Motrin, Advil, Goody's, BC's, all herbal medications, fish oil, and all vitamins    Do not wear jewelry, make-up or nail polish.  Do not wear lotions, powders, or perfumes, or deoderant.  Do not shave 48 hours prior to surgery.  Men may shave face and neck.  Do not bring valuables to the hospital.  [facility] is not responsible for any belongings or valuables.  Contacts, dentures or bridgework may not be worn into surgery.  Leave your suitcase in the car.  After surgery it may be brought to your room.  For patients admitted to the hospital, discharge time will be determined by your treatment team.  Patients discharged the day of surgery will not be allowed to drive home.    Special instructions:     [facility]- Preparing For Surgery  Before surgery, you can play an important role. Because skin is not sterile, your skin needs to be as free of germs as possible. You can reduce the number of germs on your skin by washing with CHG (chlorahexidine gluconate) Soap before surgery.  CHG is an antiseptic cleaner which kills germs and bonds with the skin to continue killing germs even after washing.  Please do not use if you have an allergy to CHG or antibacterial soaps. If your skin becomes reddened/irritated stop using the CHG.  Do not shave  (including legs and underarms) for at least 48 hours prior to first CHG shower. It is OK to shave your face.  Please follow these instructions carefully.   1. Shower the NIGHT BEFORE SURGERY and the MORNING OF SURGERY with CHG.   2. If you chose to wash your hair, wash your hair first as usual with your normal shampoo.  3. After you shampoo, rinse your hair and body thoroughly to remove the shampoo.  4. Use CHG as you would any other liquid soap. You can apply CHG directly to the skin and wash gently with a scrungie or a clean washcloth.   5. Apply the CHG Soap to your body ONLY FROM THE NECK DOWN.  Do not use on open wounds or open sores. Avoid contact with your eyes, ears, mouth and genitals (private parts). Wash genitals (private parts) with your normal soap.  6. Wash thoroughly, paying special attention to the area where your surgery will be performed.  7. Thoroughly rinse your body with warm water from the neck down.  8. DO NOT shower/wash with your normal soap after using and rinsing off the CHG Soap.  9. Pat yourself dry with a CLEAN TOWEL.   10. Wear CLEAN PAJAMAS   11. Place CLEAN SHEETS on your bed the night of your first shower and DO NOT SLEEP WITH PETS.    Day of Surgery: Do not apply any deodorants/lotions. Please wear clean clothes to the  hospital/surgery center.

## 2016-01-24 ENCOUNTER — Encounter (HOSPITAL_COMMUNITY): Payer: Self-pay

## 2016-01-24 ENCOUNTER — Encounter (HOSPITAL_COMMUNITY)
Admission: RE | Admit: 2016-01-24 | Discharge: 2016-01-24 | Disposition: A | Discharge status: Home / Self Care | Payer: Self-pay | Source: Ambulatory Visit | Attending: Surgery | Admitting: Surgery

## 2016-01-24 DIAGNOSIS — Z01818 Encounter for other preprocedural examination: Secondary | ICD-10-CM | POA: Insufficient documentation

## 2016-01-24 DIAGNOSIS — C50911 Malignant neoplasm of unspecified site of right female breast: Secondary | ICD-10-CM

## 2016-01-24 HISTORY — DX: Palpitations: R00.2

## 2016-01-24 HISTORY — DX: Headache: R51

## 2016-01-24 HISTORY — DX: Cardiac murmur, unspecified: R01.1

## 2016-01-24 HISTORY — DX: Headache, unspecified: R51.9

## 2016-01-24 HISTORY — DX: Reserved for inherently not codable concepts without codable children: IMO0001

## 2016-01-24 HISTORY — DX: Tuberculous peripheral lymphadenopathy: A18.2

## 2016-01-24 NOTE — Progress Notes (Signed)
Anesthesia Chart Review: Patient is a 55 year old female scheduled for right simple mastectomy with axillary sentinel node biopsy on 02/02/16 by Dr. Brantley Stage. 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   History includes never smoker, right breast cancer, TMJ, sensorineural hearing loss (right), GERD, HLD, MVP, palpitations, migraines, hysterectomy, scrofula (yearly chest xrays). History of septal Q wave on EKG but no clinical history of MI.   - PCP is Dr. Pricilla Holm, last visit 01/05/16.  - Cardiologist is Dr. Quay Burow, last visit 05/17/15. Echo ordered to re-evaluate MVP (see below). - HEM-ONC is Dr. Nicholas Lose, last visit 01/04/16.  Meds include Flexeril, Allegra, Flonase, Zantac, Zanaflex, vitamin E & C.  BP 114/64   Pulse 80   Temp 36.6 C   Resp 18   Ht 5' 7.75" (1.721 m)   Wt 117 lb 3.2 oz (53.2 kg)   SpO2 100%   BMI 17.95 kg/m   05/17/15 EKG ([facility]): NSR, Q wave in V1 and V2, cannot rule out old anteroseptal infarct (versus lead position).  05/24/15 Echo: Study Conclusions - Left ventricle: The cavity size was normal. Systolic function was   vigorous. The estimated ejection fraction was in the range of 65%   to 70%. Wall motion was normal; there were no regional wall   motion abnormalities. Left ventricular diastolic function   parameters were normal. - Mitral valve: Moderately thickened leaflets . Moderate myxomatous   degeneration. Moderate, holosystolic prolapse, involving both the   anterior leaflet and the posterior leaflet. There was mild to   moderate regurgitation with at least two jets, directed centrally   and eccentrically-posteriorly. (Dr. Gwenlyn Found recommended follow-up in 24 months.)  12/21/04 Carotid U/S: IMPRESSION: No significant carotid stenoses identified by duplex exam with only a tiny minimal amount of plaque present at the right carotid bifurcation and no visible plaque on the left. Estimated bilateral ICA  stenoses are less than 50% based on velocity criteria.    Labs from 01/04/16 noted and are unremarkable. She does not have HTN, DM, CKD, is not on a diuretic, ACE inhibitor, ARB, and is not currently taking chemotherapy. She did not want labs repeated since they would be within one month of her surgery. PAT RN Ailene Ravel spoke with Dr. Brantley Stage and anesthesiologist Dr. Orene Desanctis who both agreed that no further pre-operative labs would be indicated if otherwise not new changes.   If no acute changes then I anticipate that she can proceed as planned.  George Hugh [facility] Short Stay Center/Anesthesiology Phone [phone] 01/24/2016 5:13 PM

## 2016-01-24 NOTE — Progress Notes (Signed)
PCP: Dr. [name] Cardiologist: Dr. Gwenlyn Found No stress test or cath per pt, pt does report mitral valve prolapse, seen by Dr. Gwenlyn Found on Echo 05/25/15, pt states she has no issues with this.   EKG: 05/17/15  Pt states if she needs a foley, she will need a pediatric foley, as she was unable to have a larger one last time.   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 I Spoke with Ebony Hail, NP who directed me to Dr. Orene Desanctis. Per Dr. Orene Desanctis, these labs should be fine for surgery. Labs were ordered to be drawn per Dr. Brantley Stage, I spoke with Dr. Josetta Huddle office and Dr. Brantley Stage paged to see if these labs need to be drawn.   Spoke with Dr. Brantley Stage, no need to re-draw labs at this time. Pt notified.

## 2016-02-01 MED ORDER — DEXTROSE 5 % IV SOLN
3.0000 g | INTRAVENOUS | Status: AC
  Administered 2016-02-02: 3 g via INTRAVENOUS
  Filled 2016-02-01: qty 3000

## 2016-02-01 MED ORDER — GABAPENTIN 300 MG PO CAPS
300.0000 mg | ORAL_CAPSULE | ORAL | Status: AC
  Administered 2016-02-02: 300 mg via ORAL
  Filled 2016-02-01: qty 1

## 2016-02-01 MED ORDER — ACETAMINOPHEN 500 MG PO TABS
1000.0000 mg | ORAL_TABLET | ORAL | Status: AC
  Administered 2016-02-02: 1000 mg via ORAL
  Filled 2016-02-01: qty 2

## 2016-02-02 ENCOUNTER — Ambulatory Visit (HOSPITAL_COMMUNITY): Payer: Self-pay | Admitting: Certified Registered Nurse Anesthetist

## 2016-02-02 ENCOUNTER — Observation Stay (HOSPITAL_COMMUNITY)
Admission: RE | Admit: 2016-02-02 | Discharge: 2016-02-03 | Disposition: A | Discharge status: Home / Self Care | Payer: Self-pay | Source: Ambulatory Visit | Attending: Surgery | Admitting: Surgery

## 2016-02-02 ENCOUNTER — Ambulatory Visit (HOSPITAL_COMMUNITY): Payer: Self-pay | Admitting: Vascular Surgery

## 2016-02-02 ENCOUNTER — Encounter (HOSPITAL_COMMUNITY)
Admission: RE | Admit: 2016-02-02 | Discharge: 2016-02-02 | Disposition: A | Discharge status: Home / Self Care | Payer: Self-pay | Source: Ambulatory Visit | Attending: Surgery | Admitting: Surgery

## 2016-02-02 ENCOUNTER — Encounter (HOSPITAL_COMMUNITY)
Admission: RE | Disposition: A | Discharge status: Home / Self Care | Payer: Self-pay | Source: Ambulatory Visit | Attending: Surgery

## 2016-02-02 ENCOUNTER — Encounter (HOSPITAL_COMMUNITY): Payer: Self-pay | Admitting: Certified Registered Nurse Anesthetist

## 2016-02-02 DIAGNOSIS — C50919 Malignant neoplasm of unspecified site of unspecified female breast: Secondary | ICD-10-CM | POA: Diagnosis present

## 2016-02-02 DIAGNOSIS — C50011 Malignant neoplasm of nipple and areola, right female breast: Principal | ICD-10-CM | POA: Insufficient documentation

## 2016-02-02 DIAGNOSIS — C50911 Malignant neoplasm of unspecified site of right female breast: Secondary | ICD-10-CM

## 2016-02-02 DIAGNOSIS — M1990 Unspecified osteoarthritis, unspecified site: Secondary | ICD-10-CM | POA: Insufficient documentation

## 2016-02-02 HISTORY — PX: MASTECTOMY: SHX3

## 2016-02-02 HISTORY — PX: SIMPLE MASTECTOMY WITH AXILLARY SENTINEL NODE BIOPSY: SHX6098

## 2016-02-02 SURGERY — SIMPLE MASTECTOMY WITH AXILLARY SENTINEL NODE BIOPSY
Anesthesia: Regional | Site: Breast | Laterality: Right

## 2016-02-02 MED ORDER — SODIUM CHLORIDE 0.9 % IJ SOLN
INTRAMUSCULAR | Status: AC
  Filled 2016-02-02: qty 10

## 2016-02-02 MED ORDER — PROPOFOL 10 MG/ML IV BOLUS
INTRAVENOUS | Status: DC | PRN
  Administered 2016-02-02: 150 mg via INTRAVENOUS

## 2016-02-02 MED ORDER — ENOXAPARIN SODIUM 40 MG/0.4ML ~~LOC~~ SOLN
40.0000 mg | SUBCUTANEOUS | Status: DC
  Filled 2016-02-02: qty 0.4

## 2016-02-02 MED ORDER — DEXAMETHASONE SODIUM PHOSPHATE 10 MG/ML IJ SOLN
INTRAMUSCULAR | Status: DC | PRN
  Administered 2016-02-02: 10 mg via INTRAVENOUS

## 2016-02-02 MED ORDER — CHLORHEXIDINE GLUCONATE CLOTH 2 % EX PADS: 6.0000 | MEDICATED_PAD | Freq: Once | CUTANEOUS | Status: DC

## 2016-02-02 MED ORDER — PHENYLEPHRINE HCL 10 MG/ML IJ SOLN
INTRAMUSCULAR | Status: DC | PRN
  Administered 2016-02-02: 80 ug via INTRAVENOUS
  Administered 2016-02-02: 40 ug via INTRAVENOUS

## 2016-02-02 MED ORDER — FENTANYL CITRATE (PF) 100 MCG/2ML IJ SOLN
INTRAMUSCULAR | Status: AC
  Filled 2016-02-02: qty 2

## 2016-02-02 MED ORDER — 0.9 % SODIUM CHLORIDE (POUR BTL) OPTIME
TOPICAL | Status: DC | PRN
  Administered 2016-02-02: 1000 mL

## 2016-02-02 MED ORDER — ONDANSETRON HCL 4 MG/2ML IJ SOLN
INTRAMUSCULAR | Status: DC | PRN
  Administered 2016-02-02: 4 mg via INTRAVENOUS

## 2016-02-02 MED ORDER — FAMOTIDINE 20 MG PO TABS
20.0000 mg | ORAL_TABLET | Freq: Every day | ORAL | Status: DC
  Filled 2016-02-02: qty 1

## 2016-02-02 MED ORDER — MEPERIDINE HCL 25 MG/ML IJ SOLN: 6.2500 mg | INTRAMUSCULAR | Status: DC | PRN

## 2016-02-02 MED ORDER — FENTANYL CITRATE (PF) 100 MCG/2ML IJ SOLN
INTRAMUSCULAR | Status: AC
  Filled 2016-02-02: qty 4

## 2016-02-02 MED ORDER — LIDOCAINE 2% (20 MG/ML) 5 ML SYRINGE
INTRAMUSCULAR | Status: AC
  Filled 2016-02-02: qty 5

## 2016-02-02 MED ORDER — KCL IN DEXTROSE-NACL 20-5-0.9 MEQ/L-%-% IV SOLN
INTRAVENOUS | Status: DC
  Administered 2016-02-02 – 2016-02-03 (×2): via INTRAVENOUS
  Filled 2016-02-02 (×3): qty 1000

## 2016-02-02 MED ORDER — FENTANYL CITRATE (PF) 100 MCG/2ML IJ SOLN
100.0000 ug | Freq: Once | INTRAMUSCULAR | Status: AC
  Administered 2016-02-02: 100 ug via INTRAVENOUS

## 2016-02-02 MED ORDER — METHOCARBAMOL 500 MG PO TABS: 500.0000 mg | ORAL_TABLET | Freq: Four times a day (QID) | ORAL | Status: DC | PRN

## 2016-02-02 MED ORDER — HYDROMORPHONE HCL 1 MG/ML IJ SOLN
0.2500 mg | INTRAMUSCULAR | Status: DC | PRN
  Administered 2016-02-02 (×2): 0.5 mg via INTRAVENOUS

## 2016-02-02 MED ORDER — HYDROMORPHONE HCL 1 MG/ML IJ SOLN: 1.0000 mg | INTRAMUSCULAR | Status: DC | PRN

## 2016-02-02 MED ORDER — MIDAZOLAM HCL 2 MG/2ML IJ SOLN: 2.0000 mg | Freq: Once | INTRAMUSCULAR | Status: DC

## 2016-02-02 MED ORDER — BUPIVACAINE-EPINEPHRINE (PF) 0.5% -1:200000 IJ SOLN
INTRAMUSCULAR | Status: DC | PRN
  Administered 2016-02-02: 30 mL via PERINEURAL

## 2016-02-02 MED ORDER — ONDANSETRON HCL 4 MG/2ML IJ SOLN
4.0000 mg | Freq: Four times a day (QID) | INTRAMUSCULAR | Status: DC | PRN
  Administered 2016-02-02: 4 mg via INTRAVENOUS
  Filled 2016-02-02: qty 2

## 2016-02-02 MED ORDER — FENTANYL CITRATE (PF) 100 MCG/2ML IJ SOLN
INTRAMUSCULAR | Status: DC | PRN
  Administered 2016-02-02: 25 ug via INTRAVENOUS
  Administered 2016-02-02: 50 ug via INTRAVENOUS
  Administered 2016-02-02 (×2): 25 ug via INTRAVENOUS
  Administered 2016-02-02 (×2): 50 ug via INTRAVENOUS
  Administered 2016-02-02: 25 ug via INTRAVENOUS

## 2016-02-02 MED ORDER — MIDAZOLAM HCL 2 MG/2ML IJ SOLN
INTRAMUSCULAR | Status: AC
  Administered 2016-02-02: 2 mg
  Filled 2016-02-02: qty 2

## 2016-02-02 MED ORDER — TECHNETIUM TC 99M SULFUR COLLOID FILTERED
1.0000 | Freq: Once | INTRAVENOUS | Status: AC | PRN
  Administered 2016-02-02: 1 via INTRADERMAL

## 2016-02-02 MED ORDER — SIMETHICONE 80 MG PO CHEW: 40.0000 mg | CHEWABLE_TABLET | Freq: Four times a day (QID) | ORAL | Status: DC | PRN

## 2016-02-02 MED ORDER — DEXAMETHASONE SODIUM PHOSPHATE 10 MG/ML IJ SOLN
INTRAMUSCULAR | Status: AC
  Filled 2016-02-02: qty 1

## 2016-02-02 MED ORDER — ONDANSETRON HCL 4 MG/2ML IJ SOLN
INTRAMUSCULAR | Status: AC
  Filled 2016-02-02: qty 2

## 2016-02-02 MED ORDER — LIDOCAINE 2% (20 MG/ML) 5 ML SYRINGE
INTRAMUSCULAR | Status: DC | PRN
  Administered 2016-02-02: 100 mg via INTRAVENOUS

## 2016-02-02 MED ORDER — HYDROMORPHONE HCL 1 MG/ML IJ SOLN
INTRAMUSCULAR | Status: AC
  Filled 2016-02-02: qty 1

## 2016-02-02 MED ORDER — ZOLPIDEM TARTRATE 5 MG PO TABS: 5.0000 mg | ORAL_TABLET | Freq: Every evening | ORAL | Status: DC | PRN

## 2016-02-02 MED ORDER — OXYCODONE HCL 5 MG PO TABS: 5.0000 mg | ORAL_TABLET | ORAL | Status: DC | PRN

## 2016-02-02 MED ORDER — PROPOFOL 10 MG/ML IV BOLUS
INTRAVENOUS | Status: AC
  Filled 2016-02-02: qty 20

## 2016-02-02 MED ORDER — ONDANSETRON 4 MG PO TBDP: 4.0000 mg | ORAL_TABLET | Freq: Four times a day (QID) | ORAL | Status: DC | PRN

## 2016-02-02 MED ORDER — PHENYLEPHRINE 40 MCG/ML (10ML) SYRINGE FOR IV PUSH (FOR BLOOD PRESSURE SUPPORT)
PREFILLED_SYRINGE | INTRAVENOUS | Status: AC
  Filled 2016-02-02: qty 10

## 2016-02-02 MED ORDER — MIDAZOLAM HCL 2 MG/2ML IJ SOLN
INTRAMUSCULAR | Status: AC
  Filled 2016-02-02: qty 2

## 2016-02-02 MED ORDER — METHYLENE BLUE 0.5 % INJ SOLN
INTRAVENOUS | Status: AC
  Filled 2016-02-02: qty 10

## 2016-02-02 MED ORDER — PROMETHAZINE HCL 25 MG/ML IJ SOLN: 6.2500 mg | INTRAMUSCULAR | Status: DC | PRN

## 2016-02-02 MED ORDER — CEFAZOLIN SODIUM-DEXTROSE 2-4 GM/100ML-% IV SOLN
2.0000 g | Freq: Three times a day (TID) | INTRAVENOUS | Status: AC
  Administered 2016-02-02: 2 g via INTRAVENOUS
  Filled 2016-02-02: qty 100

## 2016-02-02 MED ORDER — LACTATED RINGERS IV SOLN
INTRAVENOUS | Status: DC
  Administered 2016-02-02 (×3): via INTRAVENOUS

## 2016-02-02 SURGICAL SUPPLY — 40 items
APPLIER CLIP 9.375 MED OPEN (MISCELLANEOUS) ×2
APR CLP MED 9.3 20 MLT OPN (MISCELLANEOUS) ×1
BINDER BREAST LRG (GAUZE/BANDAGES/DRESSINGS) ×1 IMPLANT
CANISTER SUCTION 2500CC (MISCELLANEOUS) ×2 IMPLANT
CHLORAPREP W/TINT 26ML (MISCELLANEOUS) ×2 IMPLANT
CLIP APPLIE 9.375 MED OPEN (MISCELLANEOUS) ×1 IMPLANT
CONT SPEC 4OZ CLIKSEAL STRL BL (MISCELLANEOUS) ×1 IMPLANT
COVER PROBE W GEL 5X96 (DRAPES) ×2 IMPLANT
COVER SURGICAL LIGHT HANDLE (MISCELLANEOUS) ×2 IMPLANT
DRAIN CHANNEL 19F RND (DRAIN) ×2 IMPLANT
DRAPE CHEST BREAST 15X10 FENES (DRAPES) ×2 IMPLANT
DRAPE UTILITY XL STRL (DRAPES) ×3 IMPLANT
DRSG PAD ABDOMINAL 8X10 ST (GAUZE/BANDAGES/DRESSINGS) ×1 IMPLANT
ELECT CAUTERY BLADE 6.4 (BLADE) ×2 IMPLANT
ELECT REM PT RETURN 9FT ADLT (ELECTROSURGICAL) ×2
ELECTRODE REM PT RTRN 9FT ADLT (ELECTROSURGICAL) ×1 IMPLANT
EVACUATOR SILICONE 100CC (DRAIN) ×2 IMPLANT
GLOVE BIOGEL PI IND STRL 8 (GLOVE) ×1 IMPLANT
GLOVE BIOGEL PI INDICATOR 8 (GLOVE) ×1
GLOVE SURG SS PI 7.0 STRL IVOR (GLOVE) ×1 IMPLANT
GLOVE SURG SS PI 7.5 STRL IVOR (GLOVE) ×2 IMPLANT
GLOVE SURG SS PI 8.0 STRL IVOR (GLOVE) ×1 IMPLANT
GOWN STRL REUS W/ TWL LRG LVL3 (GOWN DISPOSABLE) ×3 IMPLANT
GOWN STRL REUS W/ TWL XL LVL3 (GOWN DISPOSABLE) ×1 IMPLANT
GOWN STRL REUS W/TWL LRG LVL3 (GOWN DISPOSABLE) ×4
GOWN STRL REUS W/TWL XL LVL3 (GOWN DISPOSABLE) ×2
KIT BASIN OR (CUSTOM PROCEDURE TRAY) ×2 IMPLANT
KIT ROOM TURNOVER OR (KITS) ×2 IMPLANT
LIQUID BAND (GAUZE/BANDAGES/DRESSINGS) ×2 IMPLANT
NDL HYPO 25GX1X1/2 BEV (NEEDLE) IMPLANT
NEEDLE HYPO 25GX1X1/2 BEV (NEEDLE) ×2 IMPLANT
NS IRRIG 1000ML POUR BTL (IV SOLUTION) ×2 IMPLANT
PACK GENERAL/GYN (CUSTOM PROCEDURE TRAY) ×2 IMPLANT
PAD ARMBOARD 7.5X6 YLW CONV (MISCELLANEOUS) ×2 IMPLANT
SPECIMEN JAR LARGE (MISCELLANEOUS) ×1 IMPLANT
SUT ETHILON 3 0 FSL (SUTURE) ×2 IMPLANT
SUT MNCRL AB 4-0 PS2 18 (SUTURE) ×2 IMPLANT
SUT VIC AB 3-0 SH 18 (SUTURE) ×2 IMPLANT
SYR CONTROL 10ML LL (SYRINGE) ×1 IMPLANT
TOWEL OR 17X26 10 PK STRL BLUE (TOWEL DISPOSABLE) ×2 IMPLANT

## 2016-02-02 NOTE — H&P (View-Only) (Signed)
Cathy Woodward 01/04/2016 8:00 AM Location: [facility] Patient #: [id number] DOB: 02/18/1961 Undefined / Language: English / Race: Undefined Female  History of Present Illness (Aylanie Cubillos A. Avangeline Stockburger MD; 01/04/2016 10:26 AM) Patient words: patient presents at the request of Dr. Moodyfor right breast cancer.Patient has a history of protraction of her right nipple with itching since April 2017. Patient was evaluated in the multidisciplinary breast clinic. The patient is a 1 cm subareolar mass right breast core biopsy proven to be invasive lobular carcinoma with LCIS ER positive PR positive HER-2/neu negativewith a Ki-67 5%. Patient denies any other breast problems. There is a remote family history of breast cancer. She has occasional right breast pain.  The patient is a 54 year old female.   Other Problems (Wendy Smith, RN; 01/04/2016 8:00 AM) Back Pain Gastroesophageal Reflux Disease Migraine Headache  Past Surgical History (Wendy Smith, RN; 01/04/2016 8:00 AM) Breast Biopsy Right. Hysterectomy (not due to cancer) - Partial  Diagnostic Studies History (Wendy Smith, RN; 01/04/2016 8:00 AM) Colonoscopy 1-5 years ago Mammogram within last year Pap Smear >5 years ago  Medication History (Wendy Smith, RN; 01/04/2016 8:00 AM) Medications Reconciled  Social History (Wendy Smith, RN; 01/04/2016 8:00 AM) Alcohol use Occasional alcohol use. Caffeine use Coffee, Tea. No drug use Tobacco use Never smoker.  Family History (Wendy Smith, RN; 01/04/2016 8:00 AM) Arthritis Mother. Breast Cancer Family Members In General. Cancer Father. Cerebrovascular Accident Family Members In General. Colon Cancer Family Members In General. Colon Polyps Mother. Depression Brother. Diabetes Mellitus Brother, Family Members In General, Father, Mother. Heart Disease Family Members In General. Ischemic Bowel Disease Brother, Mother. Respiratory Condition Brother. Thyroid problems Brother,  Mother.  Pregnancy / Birth History (Wendy Smith, RN; 01/04/2016 8:00 AM) Age at menarche 13 years. Contraceptive History Oral contraceptives.     Review of Systems (Wendy Smith RN; 01/04/2016 8:00 AM) General Present- Night Sweats. Not Present- Appetite Loss, Chills, Fatigue, Fever, Weight Gain and Weight Loss. Skin Not Present- Change in Wart/Mole, Dryness, Hives, Jaundice, New Lesions, Non-Healing Wounds, Rash and Ulcer. HEENT Present- Hearing Loss, Ringing in the Ears, Sinus Pain, Visual Disturbances and Wears glasses/contact lenses. Not Present- Earache, Hoarseness, Nose Bleed, Oral Ulcers, Seasonal Allergies, Sore Throat and Yellow Eyes. Respiratory Present- Snoring. Not Present- Bloody sputum, Chronic Cough, Difficulty Breathing and Wheezing. Breast Present- Breast Pain, Nipple Discharge and Skin Changes. Not Present- Breast Mass. Cardiovascular Not Present- Chest Pain, Difficulty Breathing Lying Down, Leg Cramps, Palpitations, Rapid Heart Rate, Shortness of Breath and Swelling of Extremities. Gastrointestinal Not Present- Abdominal Pain, Bloating, Bloody Stool, Change in Bowel Habits, Chronic diarrhea, Constipation, Difficulty Swallowing, Excessive gas, Gets full quickly at meals, Hemorrhoids, Indigestion, Nausea, Rectal Pain and Vomiting. Female Genitourinary Not Present- Frequency, Nocturia, Painful Urination, Pelvic Pain and Urgency. Musculoskeletal Present- Muscle Weakness. Not Present- Back Pain, Joint Pain, Joint Stiffness, Muscle Pain and Swelling of Extremities. Neurological Not Present- Decreased Memory, Fainting, Headaches, Numbness, Seizures, Tingling, Tremor, Trouble walking and Weakness. Psychiatric Not Present- Anxiety, Bipolar, Change in Sleep Pattern, Depression, Fearful and Frequent crying. Endocrine Present- Hot flashes. Not Present- Cold Intolerance, Excessive Hunger, Hair Changes, Heat Intolerance and New Diabetes. Hematology Not Present- Blood Thinners, Easy Bruising,  Excessive bleeding, Gland problems, HIV and Persistent Infections.   Physical Exam (Macyn Remmert A. Kelsa Jaworowski MD; 01/04/2016 10:26 AM)  General Mental Status-Alert. General Appearance-Consistent with stated age. Hydration-Well hydrated. Voice-Normal.  Head and Neck Head-normocephalic, atraumatic with no lesions or palpable masses. Trachea-midline. Thyroid Gland Characteristics - normal size and consistency.    Eye Eyeball - Bilateral-Extraocular movements intact. Sclera/Conjunctiva - Bilateral-No scleral icterus.  Chest and Lung Exam Chest and lung exam reveals -quiet, even and easy respiratory effort with no use of accessory muscles and on auscultation, normal breath sounds, no adventitious sounds and normal vocal resonance. Inspection Chest Wall - Normal. Back - normal.  Breast Note: right breast with retracted nipple. Foley was nipple noted on the right. No other right breast masses. Left breast is normal.  Cardiovascular Cardiovascular examination reveals -normal heart sounds, regular rate and rhythm with no murmurs and normal pedal pulses bilaterally.  Neurologic Neurologic evaluation reveals -alert and oriented x 3 with no impairment of recent or remote memory. Mental Status-Normal.  Musculoskeletal Normal Exam - Left-Upper Extremity Strength Normal and Lower Extremity Strength Normal. Normal Exam - Right-Upper Extremity Strength Normal and Lower Extremity Strength Normal.  Lymphatic Head & Neck  General Head & Neck Lymphatics: Bilateral - Description - Normal. Axillary  General Axillary Region: Bilateral - Description - Normal. Tenderness - Non Tender.    Assessment & Plan (Ayden Hardwick A. Codee Tutson MD; 01/04/2016 10:28 AM)  BREAST CANCER, RIGHT (C50.911) Impression: patient requires MRI secondary to lobular phenotype. Discussed breast .onservation versus mastectomy with reconstruction. Patient desires right breast lumpectomy and this will include  the nipple. Patient requires sentinel lymph node mapping. Risk of lumpectomy include bleeding, infection, seroma, more surgery, use of seed/wire, wound care, cosmetic deformity and the need for other treatments, death , blood clots, death. Pt agrees to proceed. Risk of sentinel lymph node mapping include bleeding, infection, lymphedema, shoulder pain. stiffness, dye allergy. cosmetic deformity , blood clots, death, need for more surgery. Pt agres to proceed.  Current Plans You are being scheduled for surgery - Our schedulers will call you.  You should hear from our office's scheduling department within 5 working days about the location, date, and time of surgery. We try to make accommodations for patient's preferences in scheduling surgery, but sometimes the OR schedule or the surgeon's schedule prevents [place] from making those accommodations.  If you have not heard from our office [phone]) in 5 working days, call the office and ask for your surgeon's nurse.  If you have other questions about your diagnosis, plan, or surgery, call the office and ask for your surgeon's nurse.  Pt Education - [facility] Breast Cancer Information Given - [facility] "Breast Journey" Package We discussed the staging and pathophysiology of breast cancer. We discussed all of the different options for treatment for breast cancer including surgery, chemotherapy, radiation therapy, Herceptin, and antiestrogen therapy. We discussed a sentinel lymph node biopsy as she does not appear to having lymph node involvement right now. We discussed the performance of that with injection of radioactive tracer and blue dye. We discussed that she would have an incision underneath her axillary hairline. We discussed that there is a bout a 10-20% chance of having a positive node with a sentinel lymph node biopsy and we will await the permanent pathology to make any other first further decisions in terms of her treatment. One of these options might be to  return to the operating room to perform an axillary lymph node dissection. We discussed about a 1-2% risk lifetime of chronic shoulder pain as well as lymphedema associated with a sentinel lymph node biopsy. We discussed the options for treatment of the breast cancer which included lumpectomy versus a mastectomy. We discussed the performance of the lumpectomy with a wire placement. We discussed a 10-20% chance of a positive margin requiring reexcision  in the operating room. We also discussed that she may need radiation therapy or antiestrogen therapy or both if she undergoes lumpectomy. We discussed the mastectomy and the postoperative care for that as well. We discussed that there is no difference in her survival whether she undergoes lumpectomy with radiation therapy or antiestrogen therapy versus a mastectomy. There is a slight difference in the local recurrence rate being 3-5% with lumpectomy and about 1% with a mastectomy. We discussed the risks of operation including bleeding, infection, possible reoperation. She understands her further therapy will be based on what her stages at the time of her operation.  Pt Education - [facility] breast cancer surgery: discussed with patient and provided information. Pt Education - [facility] Breast Biopsy HCI: discussed with patient and provided information. Pt Education - [facility] (After Breast Cancer) Class Info: discussed with patient and provided information. 

## 2016-02-02 NOTE — Interval H&P Note (Signed)
History and Physical Interval Note:  02/02/2016 9:49 AM  Cathy Woodward  has presented today for surgery, with the diagnosis of RIGHT BREAST CANCER  The various methods of treatment have been discussed with the patient and family. After consideration of risks, benefits and other options for treatment, the patient has consented to  Procedure(s): RIGHT SIMPLE MASTECTOMY WITH AXILLARY SENTINEL NODE BIOPSY (Right) as a surgical intervention .  The patient's history has been reviewed, patient examined, no change in status, stable for surgery.  I have reviewed the patient's chart and labs.  Questions were answered to the patient's satisfaction.     [name].

## 2016-02-02 NOTE — Anesthesia Preprocedure Evaluation (Signed)
Anesthesia Evaluation  Patient identified by MRN, date of birth, ID band Patient awake    Reviewed: Allergy & Precautions, NPO status , Patient's Chart, lab work & pertinent test results  Airway Mallampati: II  TM Distance: >3 FB Neck ROM: Full    Dental no notable dental hx.    Pulmonary shortness of breath,    Pulmonary exam normal breath sounds clear to auscultation       Cardiovascular negative cardio ROS Normal cardiovascular exam+ Valvular Problems/Murmurs  Rhythm:Regular Rate:Normal     Neuro/Psych  Headaches, negative psych ROS   GI/Hepatic Neg liver ROS, GERD  ,  Endo/Other  negative endocrine ROS  Renal/GU negative Renal ROS     Musculoskeletal  (+) Arthritis ,   Abdominal   Peds  Hematology negative hematology ROS (+)   Anesthesia Other Findings   Reproductive/Obstetrics                             Anesthesia Physical Anesthesia Plan  ASA: II  Anesthesia Plan: General and Regional   Post-op Pain Management: GA combined w/ Regional for post-op pain   Induction: Intravenous  Airway Management Planned: LMA  Additional Equipment:   Intra-op Plan:   Post-operative Plan: Extubation in OR  Informed Consent: I have reviewed the patients History and Physical, chart, labs and discussed the procedure including the risks, benefits and alternatives for the proposed anesthesia with the patient or authorized representative who has indicated his/her understanding and acceptance.   Dental advisory given  Plan Discussed with: CRNA  Anesthesia Plan Comments:         Anesthesia Quick Evaluation

## 2016-02-02 NOTE — Progress Notes (Signed)
250 cc bolus started d/t B/P 95/52 and 87/71.B/P started out 120/71.

## 2016-02-02 NOTE — Op Note (Signed)
Cathy Woodward Sep 01, 1960 [id number] 02/02/2016   Preoperative diagnosis: right breast cancer stage 1  Postoperative diagnosis: same   Procedure: right simple mastectomy and right sentinal lymph node mapping   Surgeon: Erroll Luna A.  Pensions consultant: none   Anesthesia:GA combined with regional for post-op pain  Clinical History and Indications:The patient is seen in the holding area and we reviewed the plans for the procedure as noted above. We reviewed the risks and complications a final time. She had no further questions. I marked theright side as the operative side. The surgical and non surgical options have been discussed with the patient.  Risks of surgery include bleeding,  Infection,  Flap necrosis,  Tissue loss,  Chronic pain, death, Numbness,  And the need for additional procedures.  Reconstruction options also have been discussed with the patient as well.  The patient agrees to proceed.  Description of Procedure: The patient was taken to the operating room. After satisfactory general anesthesia was obtained the timeout was done.    A full prep and drape was then done.  I outlined an elliptical incision and marked the inframammary fold and midline. The incision was made. The usual skin flaps were raised. I went to the clavicle superiorly and sternum medially and towards the latissimus laterally.    After the superior flap was complete I was able to use the neoprobe to locate a sentinel node.  One hot node was identified and removed.  Background counts approached zero.    Once the node was removed it was forwarded to pathology.   The inferior flap was then made going to the inframammary fold and out to the latissimus. The breast was then removed from the pectoralis starting medially and working laterally. When I got to the lateral aspect of the pectoralis major muscle I opened the clavipectoral fascia. I finished removing the breast from the serratus muscles.   I  therefore completed the mastectomy by dividing the remaining attachments from the breast to the chest wall and latissimus but not taking any more tissue from the axilla. The specimen was marked to orient it for the pathologist and passed off the table.  I spent several minutes irrigating and making sure everything was dry. I then placed a 60 [place] Blake drain through a small incision in the inferior flap and laid along the pectoralis muscle. It was secured with a 2-0 nylon suture.  Another irrigation and check for hemostasis was made. Everything appeared to be dry. Incision was then closed with 3  O vicryl and 4 0 monocryl.   The patient tolerated the procedure well. There were no operative complications. All counts were correct. Estimated blood loss was 50 cc . Sterile dressings were applied and the patient taken to the PACU in satisfactory condition.  Cathy Daniels, MD, FACS 02/02/2016 11:21 AM

## 2016-02-02 NOTE — Transfer of Care (Signed)
Immediate Anesthesia Transfer of Care Note  Patient: Cathy Woodward  Procedure(s) Performed: Procedure(s): RIGHT SIMPLE MASTECTOMY WITH AXILLARY SENTINEL NODE BIOPSY (Right)  Patient Location: PACU  Anesthesia Type:General and Regional  Level of Consciousness: patient cooperative and responds to stimulation  Airway & Oxygen Therapy: Patient Spontanous Breathing and Patient connected to nasal cannula oxygen  Post-op Assessment: Report given to RN and Post -op Vital signs reviewed and stable  Post vital signs: Reviewed and stable  Last Vitals:  Vitals:   02/02/16 0837  BP: 120/71  Pulse: 74  Resp: 18  Temp: 36.3 C    Last Pain:  Vitals:   02/02/16 0837  TempSrc: Oral      Patients Stated Pain Goal: 5 ([phone])  Complications: No apparent anesthesia complications

## 2016-02-02 NOTE — Anesthesia Postprocedure Evaluation (Signed)
Anesthesia Post Note  Patient: Cathy Woodward  Procedure(s) Performed: Procedure(s) (LRB): RIGHT SIMPLE MASTECTOMY WITH AXILLARY SENTINEL NODE BIOPSY (Right)  Patient location during evaluation: PACU Anesthesia Type: General and Regional Level of consciousness: sedated and patient cooperative Pain management: pain level controlled Vital Signs Assessment: post-procedure vital signs reviewed and stable Respiratory status: spontaneous breathing Cardiovascular status: stable Anesthetic complications: no    Last Vitals:  Vitals:   02/02/16 1308 02/02/16 1326  BP: (!) 113/59 (!) 98/56  Pulse: 71 76  Resp: (!) 9 15  Temp:  36.4 C    Last Pain:  Vitals:   02/02/16 1326  TempSrc: Oral  PainSc:                  [name]

## 2016-02-02 NOTE — Discharge Instructions (Signed)
Bulb Drain Home Care A bulb drain consists of a thin rubber tube and a soft, round bulb that creates a gentle suction. The rubber tube is placed in the area where you had surgery. A bulb is attached to the end of the tube that is outside the body. The bulb drain removes excess fluid that normally builds up in a surgical wound after surgery. The color and amount of fluid will vary. Immediately after surgery, the fluid is bright red and is a little thicker than water. It may gradually change to a yellow or pink color and become more thin and water-like. When the amount decreases to about 1 or 2 tbsp in 24 hours, your health care provider will usually remove it. DAILY CARE Keep the bulb flat (compressed) at all times, except while emptying it. The flatness creates suction. You can flatten the bulb by squeezing it firmly in the middle and then closing the cap. Keep sites where the tube enters the skin dry and covered with a bandage (dressing). Secure the tube 1-2 in (2.5-5.1 cm) below the insertion sites to keep it from pulling on your stitches. The tube is stitched in place and will not slip out. Secure the bulb as directed by your health care provider. For the first 3 days after surgery, there usually is more fluid in the bulb. Empty the bulb whenever it becomes half full because the bulb does not create enough suction if it is too full. The bulb could also overflow. Write down how much fluid you remove each time you empty your drain. Add up the amount removed in 24 hours. Empty the bulb at the same time every day once the amount of fluid decreases and you only need to empty it once a day. Write down the amounts and the 24-hour totals to give to your health care provider. This helps your health care provider know when the tubes can be removed. EMPTYING THE BULB DRAIN Before emptying the bulb, get a measuring cup, a piece of paper and a pen, and wash your hands. Gently run your fingers down the tube  (stripping) to empty any drainage from the tubing into the bulb. This may need to be done several times a day to clear the tubing of clots and tissue. Open the bulb cap to release suction, which causes it to inflate. Do not touch the inside of the cap. Gently run your fingers down the tube (stripping) to empty any drainage from the tubing into the bulb. Hold the cap out of the way, and pour fluid into the measuring cup.  Squeeze the bulb to provide suction. Replace the cap.  Check the tape that holds the tube to your skin. If it is becoming loose, you can remove the loose piece of tape and apply a new one. Then, pin the bulb to your shirt.  Write down the amount of fluid you emptied out. Write down the date and each time you emptied your bulb drain. (If there are 2 bulbs, note the amount of drainage from each bulb and keep the totals separate. Your health care provider will want to know the total amounts for each drain and which tube is draining more.)  Flush the fluid down the toilet and wash your hands.  Call your health care provider once you have less than 2 tbsp of fluid collecting in the bulb drain every 24 hours. If there is drainage around the tube site, change dressings and keep the area dry. Cleanse around  tube with sterile saline and place dry gauze around site. This gauze should be changed when it is soiled. If it stays clean and unsoiled, it should still be changed daily.  SEEK MEDICAL CARE IF: Your drainage has a bad smell or is cloudy.  You have a fever.  Your drainage is increasing instead of decreasing.  Your tube fell out.  You have redness or swelling around the tube site.  You have drainage from a surgical wound.  Your bulb drain will not stay flat after you empty it.  MAKE SURE YOU:  Understand these instructions. Will watch your condition. Will get help right away if you are not doing well or get worse.   This information is not intended to replace advice  given to you by your health care provider. Make sure you discuss any questions you have with your health care provider.   Document Released: 05/11/2000 Document Revised: 06/04/2014 Document Reviewed: 12/01/2014 Elsevier Interactive Patient Education 2016 [employer]. [facility], [place] [phone]  MASTECTOMY: POST OP INSTRUCTIONS  Always review your discharge instruction sheet given to you by the facility where your surgery was performed. IF YOU HAVE DISABILITY OR FAMILY LEAVE FORMS, YOU MUST BRING THEM TO THE OFFICE FOR PROCESSING.   DO NOT GIVE THEM TO YOUR DOCTOR. A prescription for pain medication may be given to you upon discharge.  Take your pain medication as prescribed, if needed.  If narcotic pain medicine is not needed, then you may take acetaminophen (Tylenol) or ibuprofen (Advil) as needed. 1. Take your usually prescribed medications unless otherwise directed. 2. If you need a refill on your pain medication, please contact your pharmacy.  They will contact our office to request authorization.  Prescriptions will not be filled after 5pm or on week-ends. 3. You should follow a light diet the first few days after arrival home, such as soup and crackers, etc.  Resume your normal diet the day after surgery. 4. Most patients will experience some swelling and bruising on the chest and underarm.  Ice packs will help.  Swelling and bruising can take several days to resolve.  5. It is common to experience some constipation if taking pain medication after surgery.  Increasing fluid intake and taking a stool softener (such as Colace) will usually help or prevent this problem from occurring.  A mild laxative (Milk of Magnesia or Miralax) should be taken according to package instructions if there are no bowel movements after 48 hours. 6. Unless discharge instructions indicate otherwise, leave your bandage dry and in place until your next appointment in 3-5 days.  You may take a  limited sponge bath.  No tube baths or showers until the drains are removed.  You may have steri-strips (small skin tapes) in place directly over the incision.  These strips should be left on the skin for 7-10 days.  If your surgeon used skin glue on the incision, you may shower in 24 hours.  The glue will flake off over the next 2-3 weeks.  Any sutures or staples will be removed at the office during your follow-up visit. 7. DRAINS:  If you have drains in place, it is important to keep a list of the amount of drainage produced each day in your drains.  Before leaving the hospital, you should be instructed on drain care.  Call our office if you have any questions about your drains. 8. ACTIVITIES:  You may resume regular (light) daily activities beginning the next day--such as daily  self-care, walking, climbing stairs--gradually increasing activities as tolerated.  You may have sexual intercourse when it is comfortable.  Refrain from any heavy lifting or straining until approved by your doctor. a. You may drive when you are no longer taking prescription pain medication, you can comfortably wear a seatbelt, and you can safely maneuver your car and apply brakes. b. RETURN TO WORK:  __________________________________________________________ 9. You should see your doctor in the office for a follow-up appointment approximately 3-5 days after your surgery.  Your doctors nurse will typically make your follow-up appointment when she calls you with your pathology report.  Expect your pathology report 2-3 business days after your surgery.  You may call to check if you do not hear from [place] after three days.   10. OTHER INSTRUCTIONS: ______________________________________________________________________________________________ ____________________________________________________________________________________________ WHEN TO CALL YOUR DOCTOR: 1. Fever over 101.0 2. Nausea and/or vomiting 3. Extreme swelling or  bruising 4. Continued bleeding from incision. 5. Increased pain, redness, or drainage from the incision. The clinic staff is available to answer your questions during regular business hours.  Please dont hesitate to call and ask to speak to one of the nurses for clinical concerns.  If you have a medical emergency, go to the nearest emergency room or call 911.  A surgeon from [facility] is always on call at the hospital. [address], [place], [place], [place]  [address] ? [address], [place]   [address] [phone] ? [phone] ? FAX [phone] Web site: [email or web address]

## 2016-02-02 NOTE — Anesthesia Procedure Notes (Signed)
Anesthesia Regional Block:  Pectoralis block  Pre-Anesthetic Checklist: ,, timeout performed, Correct Patient, Correct Site, Correct Laterality, Correct Procedure, Correct Position, site marked, Risks and benefits discussed, Surgical consent,  Pre-op evaluation,  Post-op pain management  Laterality: Right  Prep: chloraprep       Needles:   Needle Type: Stimiplex     Needle Length: 9cm 9 cm     Additional Needles:  Procedures: ultrasound guided (picture in chart) Pectoralis block Narrative:  Injection made incrementally with aspirations every 5 mL.  Performed by: Personally  Anesthesiologist: [name]  Additional Notes: Patient tolerated well. Good fascial spread noted.

## 2016-02-03 ENCOUNTER — Encounter (HOSPITAL_COMMUNITY): Payer: Self-pay | Admitting: Surgery

## 2016-02-03 MED ORDER — OXYCODONE HCL 5 MG PO TABS: 5.0000 mg | ORAL_TABLET | ORAL | 0 refills | Status: DC | PRN

## 2016-02-03 NOTE — Discharge Planning (Addendum)
AVS reviewed with and given to patient, questions answered. JP instruction completed. Will dc to private car home with all personal belongings accompanied by family. DC at 1035.

## 2016-02-03 NOTE — Discharge Summary (Signed)
Physician Discharge Summary  Patient ID: Cathy Woodward MRN: [id number] DOB/AGE: 1960-11-11 55 y.o.  Admit date: 02/02/2016 Discharge date: 02/03/2016  Admission Diagnoses:  Discharge Diagnoses:  Active Problems:   [facility]   Discharged Condition: good  Hospital Course: uneventful post op recovery s/p surgery.  Discharged doing well pod#1  Consults: None  Significant Diagnostic Studies:   Treatments: surgery: right simple mastectomy with sentinel node biopsy  Discharge Exam: Blood pressure (!) 103/50, pulse 73, temperature 98.3 F (36.8 C), temperature source Oral, resp. rate 17, height 5' 7.75" (1.721 m), weight 57.2 kg (126 lb), SpO2 100 %. General appearance: alert, cooperative and no distress Resp: clear to auscultation bilaterally Cardio: regular rate and rhythm, S1, S2 normal, no murmur, click, rub or gallop Incision/Wound:right chest incision without hematoma.  Flaps viable  Disposition:      Medication List    TAKE these medications   cyclobenzaprine 10 MG tablet Commonly known as:  FLEXERIL Take 1 tablet (10 mg total) by mouth 3 (three) times daily as needed for muscle spasms.   fexofenadine 60 MG tablet Commonly known as:  ALLEGRA Take 1 tablet (60 mg total) by mouth 2 (two) times daily. What changed:  when to take this  reasons to take this   fluticasone 50 MCG/ACT nasal spray Commonly known as:  FLONASE Place 2 sprays into the nose daily.   multivitamin tablet Take 1 tablet by mouth daily.   naproxen sodium 220 MG tablet Commonly known as:  ANAPROX Take 440 mg by mouth 2 (two) times daily as needed (pain).   oxyCODONE 5 MG immediate release tablet Commonly known as:  Oxy IR/ROXICODONE Take 1-2 tablets (5-10 mg total) by mouth every 4 (four) hours as needed for moderate pain.   ranitidine 150 MG tablet Commonly known as:  ZANTAC Take 1 tablet (150 mg total) by mouth 2 (two) times daily.   sodium chloride 0.65 % Soln  nasal spray Commonly known as:  OCEAN Place 2 sprays into both nostrils daily.   tiZANidine 4 MG tablet Commonly known as:  ZANAFLEX Take 1 tablet (4 mg total) by mouth 3 (three) times daily. What changed:  when to take this  reasons to take this   vitamin C 500 MG tablet Commonly known as:  ASCORBIC ACID Take 500 mg by mouth daily.   vitamin E 400 UNIT capsule Take 400 Units by mouth daily.      Follow-up Information    CORNETT,THOMAS A., MD Follow up in 2 week(s).   Specialty:  General Surgery Contact information: [address] [place] [address] [phone]           Signed: Harl Bowie 02/03/2016, 8:09 AM

## 2016-02-03 NOTE — Progress Notes (Signed)
Patient ID: Cathy Woodward, female   DOB: 10/01/1960, 55 y.o.   MRN: [id number]  Doing well Comfortable Dressings dry  Plan: Discharge home

## 2016-02-09 ENCOUNTER — Encounter: Payer: Self-pay | Admitting: Internal Medicine

## 2016-02-13 ENCOUNTER — Other Ambulatory Visit: Payer: Self-pay

## 2016-02-13 ENCOUNTER — Telehealth: Payer: Self-pay | Admitting: *Deleted

## 2016-02-13 ENCOUNTER — Ambulatory Visit (HOSPITAL_BASED_OUTPATIENT_CLINIC_OR_DEPARTMENT_OTHER): Payer: Self-pay | Admitting: Hematology and Oncology

## 2016-02-13 ENCOUNTER — Encounter: Payer: Self-pay | Admitting: Hematology and Oncology

## 2016-02-13 ENCOUNTER — Encounter: Payer: Self-pay | Admitting: *Deleted

## 2016-02-13 DIAGNOSIS — Z170 Estrogen receptor positive status [ER+]: Secondary | ICD-10-CM

## 2016-02-13 DIAGNOSIS — C50111 Malignant neoplasm of central portion of right female breast: Secondary | ICD-10-CM

## 2016-02-13 NOTE — Progress Notes (Signed)
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, right   12/28/2015 Initial Diagnosis    Right breast biopsy retroareolar: 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 She is recovering very well from the surgery. She currently has a drain but it is not causing her any pain or discomfort. She is anxious to discuss the surgical pathology report.  REVIEW OF SYSTEMS:   Constitutional: Denies fevers, chills or abnormal weight loss Eyes: Denies blurriness of vision Ears, nose, mouth, throat, and face: Denies mucositis or sore throat Respiratory: Denies cough, dyspnea or wheezes Cardiovascular: Denies palpitation, chest discomfort Gastrointestinal:  Denies nausea, heartburn or change in bowel habits Skin: Denies abnormal skin  rashes Lymphatics: Denies new lymphadenopathy or easy bruising Neurological:Denies numbness, tingling or new weaknesses Behavioral/Psych: Mood is stable, no new changes  Extremities: No lower extremity edema Breast: Recent right mastectomy All other systems were reviewed with the patient and are negative.  I have reviewed the past medical history, past surgical history, social history and family history with the patient and they are unchanged from previous note.  ALLERGIES:  is allergic to doxycycline; erythromycin; minocin [minocycline hcl]; aspirin; lansoprazole; latex; and vitamin e.  MEDICATIONS:  Current Outpatient Prescriptions  Medication Sig Dispense Refill  . Ascorbic Acid (VITAMIN C) 500 MG tablet Take 500 mg by mouth daily.      . cyclobenzaprine (FLEXERIL) 10 MG tablet Take 1 tablet (10 mg total) by mouth 3 (three) times daily as needed for muscle spasms. 90 tablet 6  . fexofenadine (ALLEGRA) 60 MG tablet Take 1 tablet (60 mg total) by mouth 2 (two) times daily. (Patient taking differently: Take 60 mg by mouth 2 (two) times daily as needed for allergies. ) 60 tablet 4  . fluticasone (FLONASE) 50 MCG/ACT nasal spray Place 2 sprays into the nose daily. 48 g 3  . Multiple Vitamin (MULTIVITAMIN) tablet Take 1 tablet by mouth daily.    . naproxen sodium (ANAPROX) 220 MG tablet Take 440 mg by mouth 2 (two) times daily as needed (pain).    Marland Kitchen oxyCODONE (OXY IR/ROXICODONE) 5 MG immediate release tablet Take 1-2 tablets (5-10 mg total) by mouth every 4 (four) hours as needed for moderate pain. 10 tablet 0  . ranitidine (ZANTAC) 150 MG tablet Take 1 tablet (150 mg total) by mouth 2 (two) times  daily. 180 tablet 3  . sodium chloride (OCEAN) 0.65 % SOLN nasal spray Place 2 sprays into both nostrils daily.     Marland Kitchen tiZANidine (ZANAFLEX) 4 MG tablet Take 1 tablet (4 mg total) by mouth 3 (three) times daily. (Patient taking differently: Take 4 mg by mouth every 8 (eight) hours as needed for muscle  spasms. ) 90 tablet 3  . vitamin E 400 UNIT capsule Take 400 Units by mouth daily.       No current facility-administered medications for this visit.     PHYSICAL EXAMINATION: ECOG PERFORMANCE STATUS: 1 - Symptomatic but completely ambulatory  Vitals:   02/13/16 1020  BP: 114/71  Pulse: 90  Resp: 17  Temp: 98.2 F (36.8 C)   Filed Weights   02/13/16 1020  Weight: 115 lb 14.4 oz (52.6 kg)    GENERAL:alert, no distress and comfortable SKIN: skin color, texture, turgor are normal, no rashes or significant lesions EYES: normal, Conjunctiva are pink and non-injected, sclera clear OROPHARYNX:no exudate, no erythema and lips, buccal mucosa, and tongue normal  NECK: supple, thyroid normal size, non-tender, without nodularity LYMPH:  no palpable lymphadenopathy in the cervical, axillary or inguinal LUNGS: clear to auscultation and percussion with normal breathing effort HEART: regular rate & rhythm and no murmurs and no lower extremity edema ABDOMEN:abdomen soft, non-tender and normal bowel sounds MUSCULOSKELETAL:no cyanosis of digits and no clubbing  NEURO: alert & oriented x 3 with fluent speech, no focal motor/sensory deficits EXTREMITIES: No lower extremity edema  LABORATORY DATA:  I have reviewed the data as listed   Chemistry      Component Value Date/Time   NA 141 01/04/2016 0818   K 3.8 01/04/2016 0818   CL 105 12/15/2012 1015   CO2 25 01/04/2016 0818   BUN 15.3 01/04/2016 0818   CREATININE 0.8 01/04/2016 0818      Component Value Date/Time   CALCIUM 10.6 (H) 01/04/2016 0818   ALKPHOS 99 01/04/2016 0818   AST 19 01/04/2016 0818   ALT 16 01/04/2016 0818   BILITOT 0.45 01/04/2016 0818       Lab Results  Component Value Date   WBC 7.3 01/04/2016   HGB 13.8 01/04/2016   HCT 43.0 01/04/2016   MCV 79.9 01/04/2016   PLT 243 01/04/2016   NEUTROABS 3.6 01/04/2016     ASSESSMENT & PLAN:  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 I discussed the margins as well as lymph node surgeries. We also discussed the final staging along with previously performed ER/PR and HER-2/neu testing.  Treatment plan: 1. Oncotype DX testing to determine if chemotherapy would be of any benefit followed by 2. Adjuvant radiation therapy if she undergoes lumpectomy 3. Adjuvant antiestrogen therapy  Return to clinic based upon Oncotype DX testing. I will call her with the result of Oncotype DX score. I sent a prescription for compression sleeves. We will perform blood work for [facility] and estradiol. If she is menopausal then we will prescribe anastrozole. She is premenopausal Benadryl be tamoxifen. We will call her with a prescription as soon as we get the results of the tests.     Orders Placed This Encounter  Procedures  . Estradiol, Ultra Sens  . Follicle stimulating hormone    Standing Status:   Future    Number of Occurrences:   1    Standing Expiration Date:   03/19/2017   The patient has a good understanding of the overall plan. she agrees with it. she will call with any problems that may develop before the next visit here.   Rulon Eisenmenger, MD 02/13/16

## 2016-02-13 NOTE — Telephone Encounter (Signed)
Ordered oncotype per Dr. Gudena. Faxed requisition to pathology and confirmed receipt with Tahirih Lair 

## 2016-02-13 NOTE — Assessment & Plan Note (Addendum)
Right mastectomy 02/02/2016: Invasive lobular cancer grade 1, 2.5 cm, involves nipple submucosa, LVI present, LCIS, margins negative, 0/1 lymph node, T2 N0 stage II a, ER 70%, PR 20%, HER-2 negative, Ki-67 5%   Pathology counseling: I discussed the final pathology report of the patient provided  a copy of this report. I discussed the margins as well as lymph node surgeries. We also discussed the final staging along with previously performed ER/PR and HER-2/neu testing.  Treatment plan: 1. Oncotype DX testing to determine if chemotherapy would be of any benefit followed by 2. Adjuvant radiation therapy if she undergoes lumpectomy 3. Adjuvant antiestrogen therapy  Return to clinic based upon Oncotype DX testing

## 2016-02-14 LAB — FOLLICLE STIMULATING HORMONE: FSH: 123.5 m[IU]/mL

## 2016-02-17 ENCOUNTER — Encounter: Payer: Self-pay | Admitting: Hematology and Oncology

## 2016-02-23 LAB — ESTRADIOL, ULTRA SENS: Estradiol, Sensitive: <2.5 pg/mL

## 2016-02-27 ENCOUNTER — Telehealth: Payer: Self-pay | Admitting: *Deleted

## 2016-02-27 NOTE — Telephone Encounter (Signed)
Received Oncotype score of 26/17%. Left vm for pt to discuss f/u appt with Dr. Silverio Decamp to discuss results. Contact information received. Physician team notified.

## 2016-02-28 ENCOUNTER — Encounter: Payer: Self-pay | Admitting: Hematology and Oncology

## 2016-02-28 ENCOUNTER — Telehealth: Payer: Self-pay | Admitting: *Deleted

## 2016-02-28 ENCOUNTER — Ambulatory Visit (HOSPITAL_BASED_OUTPATIENT_CLINIC_OR_DEPARTMENT_OTHER): Payer: Self-pay | Admitting: Hematology and Oncology

## 2016-02-28 DIAGNOSIS — Z170 Estrogen receptor positive status [ER+]: Secondary | ICD-10-CM

## 2016-02-28 DIAGNOSIS — C50111 Malignant neoplasm of central portion of right female breast: Secondary | ICD-10-CM

## 2016-02-28 NOTE — Telephone Encounter (Signed)
Left vm for return call to discuss appt with Dr. Lindi Adie to discuss oncotype results. Contact information provided.

## 2016-02-28 NOTE — Progress Notes (Signed)
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 [facility])   Staging form: Breast, AJCC 7th Edition   - Clinical stage from 01/04/2016: Stage IA (T1b, N0, M0) - Unsigned  SUMMARY OF ONCOLOGIC HISTORY:   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 ([place])   12/28/2015 Initial Diagnosis    Right breast biopsy retroareolar: Invasive lobular cancer with LCIS grade 1 EF 70%, PR 20%, Ki-67 5%, HER-2 negative, ratio 1.17; screening call back for distortion, ultrasound 1 x 0.8 x 0.8 cm, T1b N0 stage IA clinical stage      02/02/2016 Surgery    Right mastectomy: Invasive lobular cancer grade 1, 2.5 cm, involves nipple submucosa, LVI present, LCIS, margins negative, 0/1 lymph node, T2 N0 stage II a, ER 70%, PR 20%, HER-2 negative, Ki-67 5% , Oncotype DX score 26, 17% ROR intermediate risk       CHIEF COMPLIANT: Follow-up to discuss Oncotype DX score  INTERVAL HISTORY: Cathy Woodward is a 55 year old with above-mentioned history of right breast cancer who underwent mastectomy and we sent for Oncotype DX score and she is here today to discuss the results. Oncotype DX came back as intermediate risk with a score of 26. She has a 17% risk of distant recurrence with tamoxifen alone. Lately she has had multiple rashes on her back in the breast. She attributes that to the surgical tape as well as the cleaning agent used during surgery. She is applying cortisone cream and taking Benadryl periodically.  REVIEW OF SYSTEMS:   Constitutional: Denies fevers, chills or abnormal weight loss Eyes: Denies blurriness of  vision Ears, nose, mouth, throat, and face: Denies mucositis or sore throat Respiratory: Denies cough, dyspnea or wheezes Cardiovascular: Denies palpitation, chest discomfort Gastrointestinal:  Denies nausea, heartburn or change in bowel habits Skin: On the back Lymphatics: Denies new lymphadenopathy or easy bruising Neurological:Denies numbness, tingling or new weaknesses Behavioral/Psych: Mood is stable, no new changes  Extremities: No lower extremity edema Breast: Right mastectomy All other systems were reviewed with the patient and are negative.  I have reviewed the past medical history, past surgical history, social history and family history with the patient and they are unchanged from previous note.  ALLERGIES:  is allergic to doxycycline; erythromycin; minocin [minocycline hcl]; aspirin; lansoprazole; latex; and vitamin e.  MEDICATIONS:  Current Outpatient Prescriptions  Medication Sig Dispense Refill  . Ascorbic Acid (VITAMIN C) 500 MG tablet Take 500 mg by mouth daily.      . cyclobenzaprine (FLEXERIL) 10 MG tablet Take 1 tablet (10 mg total) by mouth 3 (three) times daily as needed for muscle spasms. 90 tablet 6  . fexofenadine (ALLEGRA) 60 MG tablet Take 1 tablet (60 mg total) by mouth 2 (two) times daily. (Patient taking differently: Take 60 mg by mouth 2 (two) times daily as needed for allergies. ) 60 tablet 4  . fluticasone (FLONASE) 50 MCG/ACT nasal spray Place 2 sprays into the nose daily. 48 g 3  . Multiple Vitamin (MULTIVITAMIN) tablet Take 1 tablet by mouth daily.    . naproxen sodium (ANAPROX) 220 MG tablet Take 440 mg by mouth 2 (two) times daily as needed (pain).    Marland Kitchen  oxyCODONE (OXY IR/ROXICODONE) 5 MG immediate release tablet Take 1-2 tablets (5-10 mg total) by mouth every 4 (four) hours as needed for moderate pain. 10 tablet 0  . ranitidine (ZANTAC) 150 MG tablet Take 1 tablet (150 mg total) by mouth 2 (two) times daily. 180 tablet 3  . sodium chloride (OCEAN)  0.65 % SOLN nasal spray Place 2 sprays into both nostrils daily.     Marland Kitchen tiZANidine (ZANAFLEX) 4 MG tablet Take 1 tablet (4 mg total) by mouth 3 (three) times daily. (Patient taking differently: Take 4 mg by mouth every 8 (eight) hours as needed for muscle spasms. ) 90 tablet 3  . vitamin E 400 UNIT capsule Take 400 Units by mouth daily.       No current facility-administered medications for this visit.     PHYSICAL EXAMINATION: ECOG PERFORMANCE STATUS: 1 - Symptomatic but completely ambulatory  Vitals:   02/28/16 1511  BP: 116/69  Pulse: 75  Resp: 18  Temp: 97.5 F (36.4 C)   Filed Weights   02/28/16 1511  Weight: 118 lb 6.4 oz (53.7 kg)    GENERAL:alert, no distress and comfortable SKIN: skin color, texture, turgor are normal, no rashes or significant lesions EYES: normal, Conjunctiva are pink and non-injected, sclera clear OROPHARYNX:no exudate, no erythema and lips, buccal mucosa, and tongue normal  NECK: supple, thyroid normal size, non-tender, without nodularity LYMPH:  no palpable lymphadenopathy in the cervical, axillary or inguinal LUNGS: clear to auscultation and percussion with normal breathing effort HEART: regular rate & rhythm and no murmurs and no lower extremity edema ABDOMEN:abdomen soft, non-tender and normal bowel sounds MUSCULOSKELETAL:no cyanosis of digits and no clubbing  NEURO: alert & oriented x 3 with fluent speech, no focal motor/sensory deficits EXTREMITIES: No lower extremity edema  LABORATORY DATA:  I have reviewed the data as listed   Chemistry      Component Value Date/Time   NA 141 01/04/2016 0818   K 3.8 01/04/2016 0818   CL 105 12/15/2012 1015   CO2 25 01/04/2016 0818   BUN 15.3 01/04/2016 0818   CREATININE 0.8 01/04/2016 0818      Component Value Date/Time   CALCIUM 10.6 (H) 01/04/2016 0818   ALKPHOS 99 01/04/2016 0818   AST 19 01/04/2016 0818   ALT 16 01/04/2016 0818   BILITOT 0.45 01/04/2016 0818       Lab Results   Component Value Date   WBC 7.3 01/04/2016   HGB 13.8 01/04/2016   HCT 43.0 01/04/2016   MCV 79.9 01/04/2016   PLT 243 01/04/2016   NEUTROABS 3.6 01/04/2016     ASSESSMENT & PLAN:  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 Patient is likely to have about a 5% absolute difference in decreased risk of distant recurrence with chemotherapy versus no chemotherapy.  Chemotherapy counseling: I discussed the risks and benefits of chemotherapy with Taxotere and Cytoxan every 3 weeks 4 cycles. Risks include nausea, hair loss, neuropathy, neutropenia, fatigue, loss of taste, long-term risks include permanent bone marrow damage/MDS/leukemias.  Patient will think about the risks and benefits and make a  decision. If she were to receive chemotherapy, she will need a port and the chemotherapy class.   No orders of the defined types were placed in this encounter.  The patient has a good understanding of the overall plan. she agrees with it. she will call with any problems that may develop before the next visit here.   Rulon Eisenmenger, MD 02/28/16

## 2016-02-28 NOTE — Assessment & Plan Note (Signed)
Right mastectomy 02/02/2016: Invasive lobular cancer grade 1, 2.5 cm, involves nipple submucosa, LVI present, LCIS, margins negative, 0/1 lymph node, T2 N0 stage II a, ER 70%, PR 20%, HER-2 negative, Ki-67 5%   Oncotype DX score 26, intermediate risk, 17% risk of recurrence with tamoxifen alone Oncotype DX counseling: I discussed the result of the Oncotype DX score and I recommended adjuvant systemic chemotherapy. Patient is likely to have about a 5% absolute difference in decreased risk of distant recurrence with chemotherapy versus no chemotherapy.  Chemotherapy counseling: I discussed the risks and benefits of chemotherapy with Taxotere and Cytoxan every 3 weeks 4 cycles. Risks include nausea, hair loss, neuropathy, neutropenia, fatigue, loss of taste, long-term risks include permanent bone marrow damage/MDS/leukemias.  Patient will think about the risks and benefits and make a decision. If she were to receive chemotherapy, she will need a port and the chemotherapy class.

## 2016-02-29 ENCOUNTER — Telehealth: Payer: Self-pay | Admitting: *Deleted

## 2016-02-29 NOTE — Telephone Encounter (Signed)
"  I do not know if Dr. Lindi Adie uses MyChart but I missed a call from him.  Call transferred ext [phone].

## 2016-03-01 NOTE — Addendum Note (Signed)
Addended by: Cheree Ditto on: 03/01/2016 09:43 AM   Modules accepted: Orders

## 2016-03-08 ENCOUNTER — Other Ambulatory Visit: Payer: Self-pay

## 2016-03-08 ENCOUNTER — Telehealth: Payer: Self-pay | Admitting: *Deleted

## 2016-03-08 ENCOUNTER — Encounter: Payer: Self-pay | Admitting: *Deleted

## 2016-03-08 MED ORDER — ANASTROZOLE 1 MG PO TABS: 1.0000 mg | ORAL_TABLET | Freq: Every day | ORAL | 0 refills | Status: DC

## 2016-03-08 NOTE — Telephone Encounter (Signed)
Information relayed to Bary Castilla, RN who stated she would call pt back to discuss.

## 2016-03-08 NOTE — Progress Notes (Signed)
Per Dr. Geralyn Flash request, anastrozole 1mg  called in to [facility] on [address]  Bary Castilla, RN informed pt this would be called in as pt decided not to pursue chemotherapy at this time.

## 2016-03-08 NOTE — Telephone Encounter (Signed)
Received msg from Dr. Lindi Adie nurse pt left vm stating she has decided not to go forth with chemotherapy. Left vm for return call to discuss as well as inform pt anastrozole would be called in per Dr. Lindi Adie. Contact information provided.

## 2016-03-08 NOTE — Telephone Encounter (Signed)
Pt. Called @ [phone] she stated that Dr. Lindi Adie was waiting on answer on receiving Chemo, patient stated that she did NOT want to. Want Dr. Lindi Adie or his nurse to give her a call back

## 2016-03-12 NOTE — Progress Notes (Signed)
Pt return call and relate she has decided not to proceed with chemotherapy. Informed pt we will call in anastrozole to her pharmacy. Pt wishes it to be called into [facility] on [address]. Gave instructions on taking AI. Denies further questions or needs.

## 2016-04-05 ENCOUNTER — Encounter: Payer: Self-pay | Admitting: Internal Medicine

## 2016-04-05 DIAGNOSIS — Z0000 Encounter for general adult medical examination without abnormal findings: Secondary | ICD-10-CM

## 2016-04-06 ENCOUNTER — Other Ambulatory Visit (INDEPENDENT_AMBULATORY_CARE_PROVIDER_SITE_OTHER): Payer: Self-pay

## 2016-04-06 DIAGNOSIS — R7989 Other specified abnormal findings of blood chemistry: Secondary | ICD-10-CM

## 2016-04-06 DIAGNOSIS — Z0000 Encounter for general adult medical examination without abnormal findings: Secondary | ICD-10-CM

## 2016-04-06 LAB — HEMOGLOBIN A1C: HEMOGLOBIN A1C: 5.8 % (ref 4.6–6.5)

## 2016-04-06 LAB — LIPID PANEL
Cholesterol: 282 mg/dL — ABNORMAL HIGH (ref 0–200)
HDL: 56.2 mg/dL (ref >39.00)
NONHDL: 226.21
Total CHOL/HDL Ratio: 5
Triglycerides: 222 mg/dL — ABNORMAL HIGH (ref 0.0–149.0)
VLDL: 44.4 mg/dL — AB (ref 0.0–40.0)

## 2016-04-06 LAB — LDL CHOLESTEROL, DIRECT: LDL DIRECT: 200 mg/dL

## 2016-05-30 ENCOUNTER — Encounter: Payer: Self-pay | Admitting: Internal Medicine

## 2016-05-31 MED ORDER — ATORVASTATIN CALCIUM 10 MG PO TABS: 10.0000 mg | ORAL_TABLET | ORAL | 3 refills | Status: DC

## 2016-06-07 ENCOUNTER — Other Ambulatory Visit: Payer: Self-pay | Admitting: Hematology and Oncology

## 2016-06-08 ENCOUNTER — Other Ambulatory Visit: Payer: Self-pay | Admitting: Emergency Medicine

## 2016-06-08 MED ORDER — ANASTROZOLE 1 MG PO TABS: 1.0000 mg | ORAL_TABLET | Freq: Every day | ORAL | 3 refills | Status: DC

## 2016-06-14 NOTE — Assessment & Plan Note (Signed)
Right mastectomy 02/02/2016: Invasive lobular cancer grade 1, 2.5 cm, involves nipple submucosa, LVI present, LCIS, margins negative, 0/1 lymph node, T2 N0 stage II a, ER 70%, PR 20%, HER-2 negative, Ki-67 5%   Oncotype DX score 26, intermediate risk, 17% risk of recurrence with tamoxifen alone (Offered adjuvant chemo with TC: Patient refused)  Current Treatment: Anastrozole 1 mg daily started 03/08/16 Anastrozole Toxicities:   RTC in 6 months

## 2016-06-15 ENCOUNTER — Ambulatory Visit (HOSPITAL_BASED_OUTPATIENT_CLINIC_OR_DEPARTMENT_OTHER): Payer: Self-pay | Admitting: Hematology and Oncology

## 2016-06-15 ENCOUNTER — Encounter: Payer: Self-pay | Admitting: Hematology and Oncology

## 2016-06-15 DIAGNOSIS — Z170 Estrogen receptor positive status [ER+]: Secondary | ICD-10-CM

## 2016-06-15 DIAGNOSIS — Z79811 Long term (current) use of aromatase inhibitors: Secondary | ICD-10-CM

## 2016-06-15 DIAGNOSIS — C50111 Malignant neoplasm of central portion of right female breast: Secondary | ICD-10-CM

## 2016-06-15 NOTE — Progress Notes (Signed)
Patient Care Team: [name], MD as PCP - General (Internal Medicine) Delsa Bern, MD (Obstetrics and Gynecology) Sharyne Peach, MD (Ophthalmology) Juanita Craver, MD (Gastroenterology) Erroll Luna, MD as Consulting Physician (General Surgery) Nicholas Lose, MD as Consulting Physician (Hematology and Oncology) Kyung Rudd, MD as Consulting Physician (Radiation Oncology)  DIAGNOSIS:  Encounter Diagnosis  Name Primary?  . 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 ([place])   12/28/2015 Initial Diagnosis    Right breast biopsy retroareolar: Invasive lobular cancer with LCIS grade 1 EF 70%, PR 20%, Ki-67 5%, HER-2 negative, ratio 1.17; screening call back for distortion, ultrasound 1 x 0.8 x 0.8 cm, T1b N0 stage IA clinical stage      02/02/2016 Surgery    Right mastectomy: Invasive lobular cancer grade 1, 2.5 cm, involves nipple submucosa, LVI present, LCIS, margins negative, 0/1 lymph node, T2 N0 stage II a, ER 70%, PR 20%, HER-2 negative, Ki-67 5% , Oncotype DX score 26, 17% ROR intermediate risk      03/08/2016 -  Anti-estrogen oral therapy    Anastrozole 1 mg daily       CHIEF COMPLIANT: Follow-up on anastrozole  INTERVAL HISTORY: Cathy Woodward is a 56 year old with above-mentioned history of right breast cancer treated with mastectomy and is currently on oral antiestrogen therapy with anastrozole. She is here for toxicity evaluation. She complains of hot flashes. These are tolerable. Denies any myalgias. Denies any lumps or nodules in the breasts.  REVIEW OF SYSTEMS:   Constitutional: Denies fevers, chills or abnormal weight loss Eyes: Denies blurriness of vision Ears, nose, mouth, throat, and face: Denies mucositis or sore throat Respiratory: Denies cough, dyspnea or wheezes Cardiovascular: Denies palpitation, chest  discomfort Gastrointestinal:  Denies nausea, heartburn or change in bowel habits Skin: Denies abnormal skin rashes Lymphatics: Denies new lymphadenopathy or easy bruising Neurological:Denies numbness, tingling or new weaknesses Behavioral/Psych: Mood is stable, no new changes  Extremities: No lower extremity edema Breast:  denies any pain or lumps or nodules in either breasts All other systems were reviewed with the patient and are negative.  I have reviewed the past medical history, past surgical history, social history and family history with the patient and they are unchanged from previous note.  ALLERGIES:  is allergic to doxycycline; erythromycin; minocin [minocycline hcl]; aspirin; lansoprazole; latex; and vitamin e.  MEDICATIONS:  Current Outpatient Prescriptions  Medication Sig Dispense Refill  . anastrozole (ARIMIDEX) 1 MG tablet Take 1 tablet (1 mg total) by mouth daily. 90 tablet 3  . Ascorbic Acid (VITAMIN C) 500 MG tablet Take 500 mg by mouth daily.      Marland Kitchen atorvastatin (LIPITOR) 10 MG tablet Take 1 tablet (10 mg total) by mouth 3 (three) times a week. 45 tablet 3  . cyclobenzaprine (FLEXERIL) 10 MG tablet Take 1 tablet (10 mg total) by mouth 3 (three) times daily as needed for muscle spasms. 90 tablet 6  . fexofenadine (ALLEGRA) 60 MG tablet Take 1 tablet (60 mg total) by mouth 2 (two) times daily. (Patient taking differently: Take 60 mg by mouth 2 (two) times daily as needed for allergies. ) 60 tablet 4  . fluticasone (FLONASE) 50 MCG/ACT nasal spray Place 2 sprays into the nose daily. 48 g 3  . Multiple Vitamin (MULTIVITAMIN) tablet Take 1 tablet by mouth daily.    . naproxen sodium (  ANAPROX) 220 MG tablet Take 440 mg by mouth 2 (two) times daily as needed (pain).    . ranitidine (ZANTAC) 150 MG tablet Take 1 tablet (150 mg total) by mouth 2 (two) times daily. 180 tablet 3  . sodium chloride (OCEAN) 0.65 % SOLN nasal spray Place 2 sprays into both nostrils daily.     Marland Kitchen  tiZANidine (ZANAFLEX) 4 MG tablet Take 1 tablet (4 mg total) by mouth 3 (three) times daily. (Patient taking differently: Take 4 mg by mouth every 8 (eight) hours as needed for muscle spasms. ) 90 tablet 3  . vitamin E 400 UNIT capsule Take 400 Units by mouth daily.       No current facility-administered medications for this visit.     PHYSICAL EXAMINATION: ECOG PERFORMANCE STATUS: 1 - Symptomatic but completely ambulatory  Vitals:   06/15/16 0855  BP: 117/61  Pulse: 98  Resp: 18  Temp: 98.2 F (36.8 C)   Filed Weights   06/15/16 0855  Weight: 120 lb 9.6 oz (54.7 kg)    GENERAL:alert, no distress and comfortable SKIN: skin color, texture, turgor are normal, no rashes or significant lesions EYES: normal, Conjunctiva are pink and non-injected, sclera clear OROPHARYNX:no exudate, no erythema and lips, buccal mucosa, and tongue normal  NECK: supple, thyroid normal size, non-tender, without nodularity LYMPH:  no palpable lymphadenopathy in the cervical, axillary or inguinal LUNGS: clear to auscultation and percussion with normal breathing effort HEART: regular rate & rhythm and no murmurs and no lower extremity edema ABDOMEN:abdomen soft, non-tender and normal bowel sounds MUSCULOSKELETAL:no cyanosis of digits and no clubbing  NEURO: alert & oriented x 3 with fluent speech, no focal motor/sensory deficits EXTREMITIES: No lower extremity edema  LABORATORY DATA:  I have reviewed the data as listed   Chemistry      Component Value Date/Time   NA 141 01/04/2016 0818   K 3.8 01/04/2016 0818   CL 105 12/15/2012 1015   CO2 25 01/04/2016 0818   BUN 15.3 01/04/2016 0818   CREATININE 0.8 01/04/2016 0818      Component Value Date/Time   CALCIUM 10.6 (H) 01/04/2016 0818   ALKPHOS 99 01/04/2016 0818   AST 19 01/04/2016 0818   ALT 16 01/04/2016 0818   BILITOT 0.45 01/04/2016 0818       Lab Results  Component Value Date   WBC 7.3 01/04/2016   HGB 13.8 01/04/2016   HCT 43.0  01/04/2016   MCV 79.9 01/04/2016   PLT 243 01/04/2016   NEUTROABS 3.6 01/04/2016    ASSESSMENT & PLAN:  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 Periodic breast exams 2. annual mammograms 3. Annual breast MRIs in December I sent the patient up for breast MRI for this coming December.  RTC iDecember for follow-up after the MRI.   I spent 25 minutes talking to the patient of which more than half was spent in counseling and coordination of care.  Orders Placed This Encounter  Procedures  . MR BREAST BILATERAL W WO CONTRAST  Standing Status:   Future    Standing Expiration Date:   08/15/2017    Order Specific Question:   If indicated for the ordered procedure, I authorize the administration of contrast media per Radiology protocol    Answer:   Yes    Order Specific Question:   Reason for Exam (SYMPTOM  OR DIAGNOSIS REQUIRED)    Answer:   Invasive lobular cancer    Order Specific Question:   Preferred imaging location?    Answer:   [facility] (table limit-350 lbs)    Order Specific Question:   Does the patient have a pacemaker or implanted devices?    Answer:   No    Order Specific Question:   What is the patient's sedation requirement?    Answer:   No Sedation   The patient has a good understanding of the overall plan. she agrees with it. she will call with any problems that may develop before the next visit here.   Rulon Eisenmenger,  MD 06/15/16

## 2016-08-03 ENCOUNTER — Other Ambulatory Visit: Payer: Self-pay | Admitting: Surgery

## 2016-08-03 DIAGNOSIS — Z1231 Encounter for screening mammogram for malignant neoplasm of breast: Secondary | ICD-10-CM

## 2016-08-20 ENCOUNTER — Telehealth: Payer: Self-pay | Admitting: Hematology and Oncology

## 2016-08-20 NOTE — Telephone Encounter (Signed)
lvm to inform of SCP appt in June per LOS

## 2016-08-28 ENCOUNTER — Telehealth: Payer: Self-pay | Admitting: Adult Health

## 2016-08-28 NOTE — Telephone Encounter (Signed)
Patient wanted to cancel her appointment for survivorship and be mailed her information.  She said she would make an appointment if there were any questions about it

## 2016-08-28 NOTE — Telephone Encounter (Signed)
Can you please address barriers to why she doesn't want to come in?

## 2016-08-29 ENCOUNTER — Encounter: Payer: Self-pay | Admitting: Internal Medicine

## 2016-08-29 DIAGNOSIS — E785 Hyperlipidemia, unspecified: Secondary | ICD-10-CM

## 2016-09-03 ENCOUNTER — Ambulatory Visit
Admission: RE | Admit: 2016-09-03 | Discharge: 2016-09-03 | Disposition: A | Discharge status: Home / Self Care | Payer: No Typology Code available for payment source | Source: Ambulatory Visit | Attending: Surgery | Admitting: Surgery

## 2016-09-03 DIAGNOSIS — Z1231 Encounter for screening mammogram for malignant neoplasm of breast: Secondary | ICD-10-CM

## 2016-09-05 ENCOUNTER — Other Ambulatory Visit (INDEPENDENT_AMBULATORY_CARE_PROVIDER_SITE_OTHER): Payer: Self-pay

## 2016-09-05 DIAGNOSIS — E785 Hyperlipidemia, unspecified: Secondary | ICD-10-CM

## 2016-09-05 LAB — LIPID PANEL
CHOL/HDL RATIO: 4
CHOLESTEROL: 205 mg/dL — AB (ref 0–200)
HDL: 54.7 mg/dL (ref >39.00)
LDL Cholesterol: 123 mg/dL — ABNORMAL HIGH (ref 0–99)
NonHDL: 149.87
TRIGLYCERIDES: 134 mg/dL (ref 0.0–149.0)
VLDL: 26.8 mg/dL (ref 0.0–40.0)

## 2016-09-24 ENCOUNTER — Encounter: Payer: Self-pay | Admitting: Internal Medicine

## 2016-09-24 ENCOUNTER — Telehealth: Payer: Self-pay | Admitting: Internal Medicine

## 2016-09-24 ENCOUNTER — Telehealth: Payer: Self-pay

## 2016-09-24 NOTE — Telephone Encounter (Signed)
Sent mychart message stating that info and script was faxed to provided number to [place], patient also aware she can pick up copy if she wants, will put copy up front

## 2016-09-24 NOTE — Telephone Encounter (Signed)
Sent!

## 2016-09-24 NOTE — Telephone Encounter (Signed)
Received fax from [place] but that was not the intended location.   Please resend fax for a stair lift  Please refax to [phone]

## 2016-10-26 ENCOUNTER — Encounter: Payer: Self-pay | Admitting: Adult Health

## 2016-10-26 ENCOUNTER — Ambulatory Visit (HOSPITAL_BASED_OUTPATIENT_CLINIC_OR_DEPARTMENT_OTHER): Payer: Self-pay | Admitting: Adult Health

## 2016-10-26 VITALS — BP 110/64 | HR 74 | Temp 98.5°F | Resp 18 | Ht 67.0 in | Wt 120.9 lb

## 2016-10-26 DIAGNOSIS — Z79811 Long term (current) use of aromatase inhibitors: Secondary | ICD-10-CM

## 2016-10-26 DIAGNOSIS — C50111 Malignant neoplasm of central portion of right female breast: Secondary | ICD-10-CM

## 2016-10-26 DIAGNOSIS — Z170 Estrogen receptor positive status [ER+]: Secondary | ICD-10-CM

## 2016-10-26 NOTE — Progress Notes (Signed)
CLINIC:  Survivorship   REASON FOR VISIT:  Routine follow-up post-treatment for a recent history of breast cancer.  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 presents to the [facility] today for our initial meeting to review her survivorship care plan detailing her treatment course for breast cancer, as well as monitoring long-term side effects of that treatment, education regarding health maintenance, screening, and overall wellness and health promotion.     Overall, Cathy Woodward reports feeling quite well.  She is taking Anastrozole and tolerating it well.      REVIEW OF SYSTEMS:  Review of Systems  Constitutional: Negative for appetite change, chills, diaphoresis, fatigue, fever and unexpected weight change.  HENT:   Negative for hearing loss and lump/mass.   Eyes: Negative for eye problems and icterus.  Respiratory: Negative for chest tightness, cough and shortness of breath.   Cardiovascular: Negative for chest pain, leg swelling and palpitations.  Gastrointestinal: Negative for abdominal distention and abdominal pain.  Endocrine: Negative for hot flashes.  Genitourinary: Negative for difficulty urinating and dyspareunia.   Musculoskeletal: Negative for arthralgias and back pain.  Skin:  Negative for itching and rash.  Neurological: Negative for dizziness, extremity weakness and headaches.  Hematological: Negative for adenopathy. Does not bruise/bleed easily.  Psychiatric/Behavioral: Negative for confusion. The patient is not nervous/anxious.   Breast: Denies any new nodularity, masses, tenderness, nipple changes, or nipple discharge.       ONCOLOGY TREATMENT TEAM:  1. Surgeon:  Dr. Brantley Stage at [facility] 2. Medical Oncologist: Dr. Lindi Adie 3. Radiation Oncologist: Dr. Lisbeth Renshaw    PAST MEDICAL/SURGICAL HISTORY:  Past Medical History:  Diagnosis Date  . Abnormal EKG    septal Q waves  . Allergic rhinitis   . Brachial neuritis or radiculitis NOS   . 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 Chickenpox   . GERD (gastroesophageal reflux disease)   . Headache    migraines hx  . Heart murmur   . Hx of migraines   . Hyperlipidemia   . Leiomyoma of uterus, unspecified   . MVP (mitral valve prolapse)   . Palpitations    occasional  . Right-sided sensorineural hearing loss   . Scrofula    1 yr chest Xrays   . Shortness of breath dyspnea    d/t sinus issues  . Temporomandibular joint disorders, unspecified    Past Surgical History:  Procedure Laterality Date  . ABDOMINAL HYSTERECTOMY    . BREAST BIOPSY    . Excision of cervical lymph node anterior cervical chain    . MASTECTOMY Right 02/02/2016  . MOLE REMOVAL     buttock  . SIMPLE MASTECTOMY WITH AXILLARY SENTINEL NODE  BIOPSY Right 02/02/2016   Procedure: RIGHT SIMPLE MASTECTOMY WITH AXILLARY SENTINEL NODE BIOPSY;  Surgeon: Erroll Luna, MD;  Location: [facility];  Service: General;  Laterality: Right;     ALLERGIES:  Allergies  Allergen Reactions  . Doxycycline Other (See Comments)    UNSPECIFIED REACTION   . Erythromycin Other (See Comments)    UNSPECIFIED REACTION   . Minocin [Minocycline Hcl] Other (See Comments)    UNSPECIFIED REACTION   . Aspirin Other (See Comments)     VERTIGO  . Lansoprazole Rash  . Latex Rash  . Vitamin E Rash    ONLY TOPICAL AGENT     CURRENT MEDICATIONS:  Outpatient Encounter Prescriptions as of 10/26/2016  Medication Sig  . triamterene-hydrochlorothiazide (MAXZIDE-25) 37.5-25 MG tablet Take 1 tablet by mouth every other day.  . anastrozole (ARIMIDEX) 1 MG tablet Take 1 tablet (1 mg total) by mouth daily.  . Ascorbic Acid (VITAMIN C) 500 MG tablet Take 500 mg by mouth daily.    Marland Kitchen atorvastatin (LIPITOR) 10 MG tablet Take 1 tablet (10 mg total) by mouth 3 (three) times a week.  . cyclobenzaprine (FLEXERIL) 10 MG tablet Take 1 tablet (10 mg total) by mouth 3 (three) times daily as needed for muscle spasms.  . fexofenadine (ALLEGRA) 60 MG tablet Take 1 tablet (60 mg total) by mouth 2 (two) times daily. (Patient taking differently: Take 60 mg by mouth 2 (two) times daily as needed for allergies. )  . fluticasone (FLONASE) 50 MCG/ACT nasal spray Place 2 sprays into the nose daily.  . Multiple Vitamin (MULTIVITAMIN) tablet Take 1 tablet by mouth daily.  . naproxen sodium (ANAPROX) 220 MG tablet Take 440 mg by mouth 2 (two) times daily as needed (pain).  . ranitidine (ZANTAC) 150 MG tablet Take 1 tablet (150 mg total) by mouth 2 (two) times daily.  . sodium chloride (OCEAN) 0.65 % SOLN nasal spray Place 2 sprays into both nostrils daily.   Marland Kitchen tiZANidine (ZANAFLEX) 4 MG tablet Take 1 tablet (4 mg total) by mouth 3 (three) times daily. (Patient taking differently: Take 4 mg by mouth every 8 (eight) hours as needed for muscle spasms. )  . vitamin E 400 UNIT capsule Take 400 Units by mouth daily.     No facility-administered encounter medications on file as of 10/26/2016.      ONCOLOGIC FAMILY HISTORY:  Family History  Problem Relation Age of Onset  . Rheumatologic disease Mother   . Heart attack Mother        Anterio septal MI '98  . Thyroid disease Mother        hypo  . Hypertension Mother   . Diabetes Father   . Esophageal cancer  Father   . Breast cancer Maternal Aunt   . Colon cancer Other   . Renal cancer Cousin   . Colon cancer Child   . Tongue cancer Other   . Cancer Neg Hx   . Asthma Neg Hx   . Hyperlipidemia Neg Hx      GENETIC COUNSELING/TESTING: Not indicated at this time  SOCIAL HISTORY:  KANIKA BUNGERT is married and lives in [place], [place].  She has (#) children and they live in (city).  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  She denies any current or history of tobacco, alcohol, or illicit drug use.     PHYSICAL EXAMINATION:  Vital Signs:   Vitals:   10/26/16 1358  BP: 110/64  Pulse: 74  Resp: 18  Temp: 98.5 F (36.9 C)   Filed Weights   10/26/16 1358  Weight: 120 lb 14.4 oz (54.8 kg)   General: Well-nourished, well-appearing female in no acute distress.  She is unaccompanied today.   HEENT: Head is normocephalic.  Pupils equal and reactive to light. Conjunctivae clear without exudate.  Sclerae anicteric. Oral mucosa is pink, moist.  Oropharynx is pink without lesions or erythema.  Lymph: No cervical, supraclavicular, or infraclavicular lymphadenopathy noted on palpation.  Cardiovascular: Regular rate and rhythm.Marland Kitchen Respiratory: Clear to auscultation bilaterally. Chest expansion symmetric; breathing non-labored.  GI: Abdomen soft and round; non-tender, non-distended. Bowel sounds normoactive.  GU: Deferred.  Neuro: No focal deficits. Steady gait.  Psych: Mood and affect normal and appropriate for situation.  Extremities: No edema. Skin: Warm and dry.  LABORATORY DATA:  None for this visit.  DIAGNOSTIC IMAGING:  None for this visit.      ASSESSMENT AND PLAN:  Ms.. Woodward is a pleasant 56 y.o. female with Stage IIA right breast invasive lobularcarcinoma, ER+/PR+/HER2-, diagnosed in 12/2015, treated with mastectomy and anti estrogen therapy with Anastrozole beginning in 02/2016.  She presents to the Survivorship Clinic for our  initial meeting and routine follow-up post-completion of treatment for breast cancer.    1. Stage IIA right breast cancer:  Cathy Woodward is continuing to recover from definitive treatment for breast cancer. She will follow-up with her medical oncologist, Dr. Pamelia Hoit in 04/2017 with history and physical exam per surveillance protocol.  She will continue her anti-estrogen therapy with Anastrozole. Thus far, she is tolerating the Anastrozole well, with minimal side effects. She was instructed to make Dr. Pamelia Hoit or myself aware if she begins to experience any worsening side effects of the medication and I could see her back in clinic to help manage those side effects, as needed. Th Today, a comprehensive survivorship care plan and treatment summary was reviewed with the patient today detailing her breast cancer diagnosis, treatment course, potential late/long-term effects of treatment, appropriate follow-up care with recommendations for the future, and patient education resources.  A copy of this summary, along with a letter will be sent to the patient's primary care provider via mail/fax/In Basket message after today's visit.    2. Bone health:  Given Cathy Woodward's age/history of breast cancer and her current treatment regimen including anti-estrogen therapy with Anastrozole, she is at risk for bone demineralization. She has not yet had DEXA, but is going to call around to get pricing on having one done since she is self pay.  In the meantime, she was encouraged to increase her consumption of foods rich in calcium, as well as increase her weight-bearing activities.  She was given education on specific activities to promote bone health.  3. Cancer screening:  Due to Ms. Martorano's history and her age, she should receive screening for skin cancers, colon cancer, and gynecologic cancers.  The information and recommendations are listed on the patient's comprehensive care plan/treatment summary and were reviewed in  detail with the patient.    4. Health maintenance and wellness promotion: Cathy Woodward was encouraged to consume 5-7 servings of fruits and vegetables per day. We reviewed the "Nutrition Rainbow" handout, as well as the handout "Take Control of Your Health and Reduce Your Cancer Risk" from the [employer] Society.  She was also encouraged to engage in moderate to vigorous exercise for 30 minutes per day most days of the week. We discussed the [employer] fitness program,  which is designed for cancer survivors to help them become more physically fit after cancer treatments.  She was instructed to limit her alcohol consumption and continue to abstain from tobacco use.     5. Support services/counseling: It is not uncommon for this period of the patient's cancer care trajectory to be one of many emotions and stressors.  We discussed an opportunity for her to participate in the next session of [facility] ("Finding Your New Normal") support group series designed for patients after they have completed treatment.   Cathy Woodward was encouraged to take advantage of our many other support services programs, support groups, and/or counseling in coping with her new life as a cancer survivor after completing anti-cancer treatment.  She was offered support today through active listening and expressive supportive counseling.  She was given information regarding our available services and encouraged to contact me with any questions or for help enrolling in any of our support group/programs.    Dispo:   -Return to cancer center for follow up with Dr. Lindi Adie as scheduled -bone density baseline in the next few months  -She is welcome to return back to the Survivorship Clinic at any time; no additional follow-up needed at this time.  -Consider referral back to survivorship as a long-term survivor for continued surveillance  A total of (30) minutes of face-to-face time was spent with this patient with greater than 50% of  that time in counseling and care-coordination.   Cathy Woodward, [place] [phone]   Note: PRIMARY CARE PROVIDER [name], [place] [phone]

## 2016-11-07 ENCOUNTER — Encounter: Payer: Self-pay | Admitting: Internal Medicine

## 2016-11-07 ENCOUNTER — Encounter: Payer: Self-pay | Admitting: Adult Health

## 2016-11-08 ENCOUNTER — Other Ambulatory Visit: Payer: Self-pay | Admitting: Adult Health

## 2016-11-08 DIAGNOSIS — E2839 Other primary ovarian failure: Secondary | ICD-10-CM

## 2016-11-15 ENCOUNTER — Other Ambulatory Visit: Payer: Self-pay | Admitting: *Deleted

## 2016-11-15 ENCOUNTER — Telehealth: Payer: Self-pay | Admitting: *Deleted

## 2016-11-15 DIAGNOSIS — C50111 Malignant neoplasm of central portion of right female breast: Secondary | ICD-10-CM

## 2016-11-15 NOTE — Telephone Encounter (Signed)
Patient called stating she was seen by Mendel Ryder on 10/26/16, and discussed bone density screening- she would like to have performed at [place] along with a chest x-ray she has had periodic shortness of breath. Per Mendel Ryder order placed for both bone density, and DG 2 view chest x-ray. Notified patient that orders have been placed. Patient states [facility], and chest x-ray at [facility].

## 2016-11-23 ENCOUNTER — Telehealth: Payer: Self-pay | Admitting: *Deleted

## 2016-11-23 ENCOUNTER — Ambulatory Visit (HOSPITAL_COMMUNITY)
Admission: RE | Admit: 2016-11-23 | Discharge: 2016-11-23 | Disposition: A | Discharge status: Home / Self Care | Payer: No Typology Code available for payment source | Source: Ambulatory Visit | Attending: Adult Health | Admitting: Adult Health

## 2016-11-23 DIAGNOSIS — C50111 Malignant neoplasm of central portion of right female breast: Secondary | ICD-10-CM | POA: Insufficient documentation

## 2016-11-23 NOTE — Telephone Encounter (Signed)
Called pt to inform her results of the C-Xray and explained that results were normal. I told pt I will put a copy of this report in the mail to her today. Pt was pleased with results. No further concerns. Message to be fwd to [employer].

## 2016-11-30 ENCOUNTER — Ambulatory Visit
Admission: RE | Admit: 2016-11-30 | Discharge: 2016-11-30 | Disposition: A | Discharge status: Home / Self Care | Payer: No Typology Code available for payment source | Source: Ambulatory Visit | Attending: Adult Health | Admitting: Adult Health

## 2016-11-30 DIAGNOSIS — E2839 Other primary ovarian failure: Secondary | ICD-10-CM

## 2016-12-03 ENCOUNTER — Telehealth: Payer: Self-pay

## 2016-12-03 NOTE — Telephone Encounter (Signed)
Called pt per Lindsey,NP request to let her know about her bone density report. Advised pt that she needs to start taking calcium and vitamin D at this time. She may also try fosamax. Pt states that she normally get all her supplementation through her diet, but will start taking some additional oral supplementation. Encouraged pt to also do some weight bearing exercises to help prevent osteoporosis. Pt verbalized understanding.

## 2017-01-04 ENCOUNTER — Encounter: Payer: Self-pay | Admitting: Internal Medicine

## 2017-01-04 LAB — HM COLONOSCOPY

## 2017-01-04 NOTE — Progress Notes (Signed)
Abstracted and sent to scan  

## 2017-01-07 ENCOUNTER — Encounter: Payer: Self-pay | Admitting: Nurse Practitioner

## 2017-01-07 ENCOUNTER — Ambulatory Visit (INDEPENDENT_AMBULATORY_CARE_PROVIDER_SITE_OTHER): Payer: Self-pay | Admitting: Nurse Practitioner

## 2017-01-07 VITALS — BP 116/68 | HR 75 | Temp 97.5°F | Ht 67.0 in | Wt 122.0 lb

## 2017-01-07 DIAGNOSIS — E782 Mixed hyperlipidemia: Secondary | ICD-10-CM

## 2017-01-07 DIAGNOSIS — M8580 Other specified disorders of bone density and structure, unspecified site: Secondary | ICD-10-CM

## 2017-01-07 DIAGNOSIS — K2100 Gastro-esophageal reflux disease with esophagitis, without bleeding: Secondary | ICD-10-CM

## 2017-01-07 DIAGNOSIS — R739 Hyperglycemia, unspecified: Secondary | ICD-10-CM

## 2017-01-07 DIAGNOSIS — R1031 Right lower quadrant pain: Secondary | ICD-10-CM

## 2017-01-07 DIAGNOSIS — Z0001 Encounter for general adult medical examination with abnormal findings: Secondary | ICD-10-CM

## 2017-01-07 NOTE — Patient Instructions (Signed)
Go to basement for blood draw.  Health Maintenance, Female Adopting a healthy lifestyle and getting preventive care can go a long way to promote health and wellness. Talk with your health care provider about what schedule of regular examinations is right for you. This is a good chance for you to check in with your provider about disease prevention and staying healthy. In between checkups, there are plenty of things you can do on your own. Experts have done a lot of research about which lifestyle changes and preventive measures are most likely to keep you healthy. Ask your health care provider for more information. Weight and diet Eat a healthy diet  Be sure to include plenty of vegetables, fruits, low-fat dairy products, and lean protein.  Do not eat a lot of foods high in solid fats, added sugars, or salt.  Get regular exercise. This is one of the most important things you can do for your health. ? Most adults should exercise for at least 150 minutes each week. The exercise should increase your heart rate and make you sweat (moderate-intensity exercise). ? Most adults should also do strengthening exercises at least twice a week. This is in addition to the moderate-intensity exercise.  Maintain a healthy weight  Body mass index (BMI) is a measurement that can be used to identify possible weight problems. It estimates body fat based on height and weight. Your health care provider can help determine your BMI and help you achieve or maintain a healthy weight.  For females 5 years of age and older: ? A BMI below 18.5 is considered underweight. ? A BMI of 18.5 to 24.9 is normal. ? A BMI of 25 to 29.9 is considered overweight. ? A BMI of 30 and above is considered obese.  Watch levels of cholesterol and blood lipids  You should start having your blood tested for lipids and cholesterol at 57 years of age, then have this test every 5 years.  You may need to have your cholesterol levels checked  more often if: ? Your lipid or cholesterol levels are high. ? You are older than 57 years of age. ? You are at high risk for heart disease.  Cancer screening Lung Cancer  Lung cancer screening is recommended for adults 36-76 years old who are at high risk for lung cancer because of a history of smoking.  A yearly low-dose CT scan of the lungs is recommended for people who: ? Currently smoke. ? Have quit within the past 15 years. ? Have at least a 30-pack-year history of smoking. A pack year is smoking an average of one pack of cigarettes a day for 1 year.  Yearly screening should continue until it has been 15 years since you quit.  Yearly screening should stop if you develop a health problem that would prevent you from having lung cancer treatment.  Breast Cancer  Practice breast self-awareness. This means understanding how your breasts normally appear and feel.  It also means doing regular breast self-exams. Let your health care provider know about any changes, no matter how small.  If you are in your 20s or 30s, you should have a clinical breast exam (CBE) by a health care provider every 1-3 years as part of a regular health exam.  If you are 23 or older, have a CBE every year. Also consider having a breast X-ray (mammogram) every year.  If you have a family history of breast cancer, talk to your health care provider about genetic screening.  If you are at high risk for breast cancer, talk to your health care provider about having an MRI and a mammogram every year.  Breast cancer gene (BRCA) assessment is recommended for women who have family members with BRCA-related cancers. BRCA-related cancers include: ? Breast. ? Ovarian. ? Tubal. ? Peritoneal cancers.  Results of the assessment will determine the need for genetic counseling and BRCA1 and BRCA2 testing.  Cervical Cancer Your health care provider may recommend that you be screened regularly for cancer of the pelvic  organs (ovaries, uterus, and vagina). This screening involves a pelvic examination, including checking for microscopic changes to the surface of your cervix (Pap test). You may be encouraged to have this screening done every 3 years, beginning at age 17.  For women ages 64-65, health care providers may recommend pelvic exams and Pap testing every 3 years, or they may recommend the Pap and pelvic exam, combined with testing for human papilloma virus (HPV), every 5 years. Some types of HPV increase your risk of cervical cancer. Testing for HPV may also be done on women of any age with unclear Pap test results.  Other health care providers may not recommend any screening for nonpregnant women who are considered low risk for pelvic cancer and who do not have symptoms. Ask your health care provider if a screening pelvic exam is right for you.  If you have had past treatment for cervical cancer or a condition that could lead to cancer, you need Pap tests and screening for cancer for at least 20 years after your treatment. If Pap tests have been discontinued, your risk factors (such as having a new sexual partner) need to be reassessed to determine if screening should resume. Some women have medical problems that increase the chance of getting cervical cancer. In these cases, your health care provider may recommend more frequent screening and Pap tests.  Colorectal Cancer  This type of cancer can be detected and often prevented.  Routine colorectal cancer screening usually begins at 57 years of age and continues through 57 years of age.  Your health care provider may recommend screening at an earlier age if you have risk factors for colon cancer.  Your health care provider may also recommend using home test kits to check for hidden blood in the stool.  A small camera at the end of a tube can be used to examine your colon directly (sigmoidoscopy or colonoscopy). This is done to check for the earliest forms  of colorectal cancer.  Routine screening usually begins at age 78.  Direct examination of the colon should be repeated every 5-10 years through 57 years of age. However, you may need to be screened more often if early forms of precancerous polyps or small growths are found.  Skin Cancer  Check your skin from head to toe regularly.  Tell your health care provider about any new moles or changes in moles, especially if there is a change in a mole's shape or color.  Also tell your health care provider if you have a mole that is larger than the size of a pencil eraser.  Always use sunscreen. Apply sunscreen liberally and repeatedly throughout the day.  Protect yourself by wearing long sleeves, pants, a wide-brimmed hat, and sunglasses whenever you are outside.  Heart disease, diabetes, and high blood pressure  High blood pressure causes heart disease and increases the risk of stroke. High blood pressure is more likely to develop in: ? People who have blood  pressure in the high end of the normal range (130-139/85-89 mm Hg). ? People who are overweight or obese. ? People who are African American.  If you are 65-57 years of age, have your blood pressure checked every 3-5 years. If you are 45 years of age or older, have your blood pressure checked every year. You should have your blood pressure measured twice-once when you are at a hospital or clinic, and once when you are not at a hospital or clinic. Record the average of the two measurements. To check your blood pressure when you are not at a hospital or clinic, you can use: ? An automated blood pressure machine at a pharmacy. ? A home blood pressure monitor.  If you are between 10 years and 29 years old, ask your health care provider if you should take aspirin to prevent strokes.  Have regular diabetes screenings. This involves taking a blood sample to check your fasting blood sugar level. ? If you are at a normal weight and have a low risk  for diabetes, have this test once every three years after 57 years of age. ? If you are overweight and have a high risk for diabetes, consider being tested at a younger age or more often. Preventing infection Hepatitis B  If you have a higher risk for hepatitis B, you should be screened for this virus. You are considered at high risk for hepatitis B if: ? You were born in a country where hepatitis B is common. Ask your health care provider which countries are considered high risk. ? Your parents were born in a high-risk country, and you have not been immunized against hepatitis B (hepatitis B vaccine). ? You have HIV or AIDS. ? You use needles to inject street drugs. ? You live with someone who has hepatitis B. ? You have had sex with someone who has hepatitis B. ? You get hemodialysis treatment. ? You take certain medicines for conditions, including cancer, organ transplantation, and autoimmune conditions.  Hepatitis C  Blood testing is recommended for: ? Everyone born from [birth year] through 1965. ? Anyone with known risk factors for hepatitis C.  Sexually transmitted infections (STIs)  You should be screened for sexually transmitted infections (STIs) including gonorrhea and chlamydia if: ? You are sexually active and are younger than 57 years of age. ? You are older than 57 years of age and your health care provider tells you that you are at risk for this type of infection. ? Your sexual activity has changed since you were last screened and you are at an increased risk for chlamydia or gonorrhea. Ask your health care provider if you are at risk.  If you do not have HIV, but are at risk, it may be recommended that you take a prescription medicine daily to prevent HIV infection. This is called pre-exposure prophylaxis (PrEP). You are considered at risk if: ? You are sexually active and do not regularly use condoms or know the HIV status of your partner(s). ? You take drugs by  injection. ? You are sexually active with a partner who has HIV.  Talk with your health care provider about whether you are at high risk of being infected with HIV. If you choose to begin PrEP, you should first be tested for HIV. You should then be tested every 3 months for as long as you are taking PrEP. Pregnancy  If you are premenopausal and you may become pregnant, ask your health care provider about preconception  counseling.  If you may become pregnant, take 400 to 800 micrograms (mcg) of folic acid every day.  If you want to prevent pregnancy, talk to your health care provider about birth control (contraception). Osteoporosis and menopause  Osteoporosis is a disease in which the bones lose minerals and strength with aging. This can result in serious bone fractures. Your risk for osteoporosis can be identified using a bone density scan.  If you are 61 years of age or older, or if you are at risk for osteoporosis and fractures, ask your health care provider if you should be screened.  Ask your health care provider whether you should take a calcium or vitamin D supplement to lower your risk for osteoporosis.  Menopause may have certain physical symptoms and risks.  Hormone replacement therapy may reduce some of these symptoms and risks. Talk to your health care provider about whether hormone replacement therapy is right for you. Follow these instructions at home:  Schedule regular health, dental, and eye exams.  Stay current with your immunizations.  Do not use any tobacco products including cigarettes, chewing tobacco, or electronic cigarettes.  If you are pregnant, do not drink alcohol.  If you are breastfeeding, limit how much and how often you drink alcohol.  Limit alcohol intake to no more than 1 drink per day for nonpregnant women. One drink equals 12 ounces of beer, 5 ounces of wine, or 1 ounces of hard liquor.  Do not use street drugs.  Do not share needles.  Ask  your health care provider for help if you need support or information about quitting drugs.  Tell your health care provider if you often feel depressed.  Tell your health care provider if you have ever been abused or do not feel safe at home. This information is not intended to replace advice given to you by your health care provider. Make sure you discuss any questions you have with your health care provider. Document Released: 11/27/2010 Document Revised: 10/20/2015 Document Reviewed: 02/15/2015 Elsevier Interactive Patient Education  [employer].

## 2017-01-07 NOTE — Progress Notes (Signed)
Subjective:    Patient ID: Cathy Woodward, female    DOB: 1960/12/01, 56 y.o.   MRN: [id number]  Patient presents today for complete physical  Abdominal Pain  This is a chronic problem. The current episode started more than 1 year ago. The onset quality is gradual. The problem occurs intermittently. The problem has been waxing and waning. The pain is located in the RLQ. The quality of the pain is colicky. Pain radiation: right pelvic region. Pertinent negatives include no anorexia, belching, constipation, diarrhea, dysuria, fever, flatus, frequency, headaches, hematochezia, hematuria, melena, myalgias, nausea, vomiting or weight loss. The pain is aggravated by palpation and movement. The pain is relieved by nothing. She has tried nothing for the symptoms. Prior diagnostic workup includes CT scan (CT done 2012). Her past medical history is significant for abdominal surgery. hx of uterine fibroid with hysterectomy. varies still present   Breast Cancer: Current use of Arimadex. Annual follow up with oncology.  Osteopenia: Wants ionized calcium and vit D levels checks.  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  Immunizations: (TDAP, Hep C screen, Pneumovax, Influenza, zoster)  Health Maintenance  Topic Date Due  . Flu Shot  12/26/2016  . Mammogram  09/04/2018  . Tetanus Vaccine  01/30/2022  . Colon Cancer Screening  01/05/2027  .  Hepatitis C: One time screening is recommended by Center for Disease Control  (CDC) for  adults born from [birth year] through 1966.   Addressed  . HIV Screening  Addressed   Diet:heart healthy.  Weight:  Wt Readings from Last 3 Encounters:  01/07/17 122 lb (55.3 kg)  10/26/16 120 lb 14.4 oz (54.8 kg)  06/15/16 120 lb 9.6 oz (54.7 kg)   Exercise:walking.  Fall Risk: Fall Risk  01/07/2017  Falls in the past year? No   Home Safety:home alone.  Depression/Suicide: Depression screen [facility] 2/9 01/07/2017  Decreased Interest 0  Down, Depressed,  Hopeless 0  PHQ - 2 Score 0   No flowsheet data found. Colonoscopy (every 5-6yrs, >50-11yrs):up to date, colon polyp resolved.  EGD: duodenal ulcer. Per patient  Dexa (every 2-58yrs, >39yrs):osteopenia noted per patient. dexa scan done by GYN.  Vision:up to date.  Dental:up to date.  Advanced Directive:working on form with lawter at this time. Advanced Directives 02/28/2016  Does Patient Have a Medical Advance Directive? No  Would patient like information on creating a medical advance directive? -   Sexual History (birth control, marital status, STD):single, not sexually active.  Medications and allergies reviewed with patient and updated if appropriate.  Patient Active Problem List   Diagnosis Date Noted  . Breast cancer, stage 2 ([place]) 02/02/2016  . 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 Hyperlipidemia 05/17/2015  . Routine health maintenance 02/03/2012  . Degeneration of cervical intervertebral disc 09/01/2007  . Allergic rhinitis 07/22/2007  . GERD 07/22/2007  . Mitral valve disorder 05/14/2007    Current Outpatient Prescriptions on File Prior to Visit  Medication Sig Dispense Refill  . anastrozole (ARIMIDEX) 1 MG tablet Take 1 tablet (1 mg total) by mouth daily. 90 tablet 3  . Ascorbic Acid (VITAMIN C) 500 MG tablet Take 500 mg by mouth daily.      Marland Kitchen atorvastatin (LIPITOR) 10 MG tablet Take 1 tablet (10 mg total) by mouth 3 (three) times a week. 45 tablet 3  . cyclobenzaprine (FLEXERIL) 10 MG tablet Take 1 tablet (10 mg total) by mouth 3 (three) times daily as needed for  muscle spasms. 90 tablet 6  . fexofenadine (ALLEGRA) 60 MG tablet Take 1 tablet (60 mg total) by mouth 2 (two) times daily. (Patient taking differently: Take 60 mg by mouth 2 (two) times daily as needed for allergies. ) 60 tablet 4  . fluticasone (FLONASE) 50 MCG/ACT nasal spray Place 2 sprays into the nose daily. 48 g 3  . Multiple Vitamin (MULTIVITAMIN) tablet Take 1 tablet  by mouth daily.    . naproxen sodium (ANAPROX) 220 MG tablet Take 440 mg by mouth 2 (two) times daily as needed (pain).    . ranitidine (ZANTAC) 150 MG tablet Take 1 tablet (150 mg total) by mouth 2 (two) times daily. 180 tablet 3  . sodium chloride (OCEAN) 0.65 % SOLN nasal spray Place 2 sprays into both nostrils daily.     Marland Kitchen tiZANidine (ZANAFLEX) 4 MG tablet Take 1 tablet (4 mg total) by mouth 3 (three) times daily. (Patient taking differently: Take 4 mg by mouth every 8 (eight) hours as needed for muscle spasms. ) 90 tablet 3  . triamterene-hydrochlorothiazide (MAXZIDE-25) 37.5-25 MG tablet Take 1 tablet by mouth every other day.    . vitamin E 400 UNIT capsule Take 400 Units by mouth daily.       No current facility-administered medications on file prior to visit.     Past Medical History:  Diagnosis Date  . Abnormal EKG    septal Q waves  . Allergic rhinitis   . Brachial neuritis or radiculitis NOS   . 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 Chickenpox   . GERD (gastroesophageal reflux disease)   . Headache    migraines hx  . Heart murmur   . Hx of migraines   . Hyperlipidemia   . Leiomyoma of uterus, unspecified   . MVP (mitral valve prolapse)   . Palpitations    occasional  . Right-sided sensorineural hearing loss   . Scrofula    1 yr chest Xrays   . Shortness of breath dyspnea    d/t sinus issues  . Temporomandibular joint disorders, unspecified     Past Surgical History:  Procedure Laterality Date  . ABDOMINAL HYSTERECTOMY    . BREAST BIOPSY    . Excision of cervical lymph node anterior cervical chain    . MASTECTOMY Right 02/02/2016  . MOLE REMOVAL     buttock  . SIMPLE MASTECTOMY WITH AXILLARY SENTINEL NODE BIOPSY Right 02/02/2016   Procedure: RIGHT SIMPLE MASTECTOMY WITH AXILLARY SENTINEL NODE BIOPSY;  Surgeon: Erroll Luna, MD;  Location: [facility];  Service: General;  Laterality: Right;    Social History   Social History  . Marital  status: Single    Spouse name: N/A  . Number of children: N/A  . Years of education: 38   Occupational History  . physician-internist     [employer]   Social History Main Topics  . Smoking status: Never Smoker  . Smokeless tobacco: Never Used  . Alcohol use Yes     Comment: occ, rare  . Drug use: No  . Sexual activity: No   Other Topics Concern  . None   Social History Narrative   [employer] - years 1-2 medical school, [employer]. [employer] years 3-4 medical school. 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 Traveled to [place] in Sept '11. Single - has a house in [place] - home base.    Family History  Problem Relation  Age of Onset  . Rheumatologic disease Mother   . Heart attack Mother        Anterio septal MI '98  . Thyroid disease Mother        hypo  . Hypertension Mother   . Diabetes Father   . Esophageal cancer Father   . Breast cancer Maternal Aunt   . Colon cancer Other   . Renal cancer Cousin   . Colon cancer Child   . Tongue cancer Other   . Cancer Neg Hx   . Asthma Neg Hx   . Hyperlipidemia Neg Hx         Review of Systems  Constitutional: Negative for fever and weight loss.  Gastrointestinal: Positive for abdominal pain. Negative for anorexia, constipation, diarrhea, flatus, hematochezia, melena, nausea and vomiting.  Genitourinary: Negative for dysuria, frequency and hematuria.  Musculoskeletal: Negative for myalgias.  Neurological: Negative for headaches.  Psychiatric/Behavioral: The patient is not nervous/anxious and does not have insomnia.     Objective:   Vitals:   01/07/17 0904  BP: 116/68  Pulse: 75  Temp: (!) 97.5 F (36.4 C)  SpO2: 99%    Body mass index is 19.11 kg/m.   Physical Examination:  Physical Exam  Constitutional: She is oriented to person, place, and time and well-developed, well-nourished, and in no distress. No distress.  HENT:  Right Ear: External ear normal.  Left Ear: External  ear normal.  Eyes: Pupils are equal, round, and reactive to light. Conjunctivae and EOM are normal. No scleral icterus.  Neck: Normal range of motion. Neck supple. No thyromegaly present.  Cardiovascular: Normal rate and intact distal pulses.   Murmur heard. Pulmonary/Chest: Effort normal and breath sounds normal. She exhibits no tenderness.  Abdominal: Soft. Bowel sounds are normal. She exhibits no distension.  Musculoskeletal: Normal range of motion. She exhibits no edema or tenderness.  Lymphadenopathy:    She has no cervical adenopathy.  Neurological: She is alert and oriented to person, place, and time. Gait normal.  Skin: Skin is warm and dry.  Psychiatric: Affect and judgment normal.  Vitals reviewed.   ASSESSMENT and PLAN:  Senia was seen today for annual exam.  Diagnoses and all orders for this visit:  Encounter for preventative adult health care exam with abnormal findings -     Comprehensive metabolic panel; Future -     CBC w/Diff; Future  Gastroesophageal reflux disease with esophagitis  Mixed hyperlipidemia -     Lipid panel; Future  Colicky RLQ abdominal pain -     US Pelvis Limited; Future -     US Transvaginal Non-OB; Future  Osteopenia, unspecified location -     Vitamin D 1,25 dihydroxy; Future -     Calcium, ionized; Future  Hyperglycemia -     Hemoglobin A1c; Future    No problem-specific Assessment & Plan notes found for this encounter.      Follow up: No Follow-up on file.  Wilfred Lacy, NP

## 2017-01-14 ENCOUNTER — Other Ambulatory Visit: Payer: Self-pay | Admitting: *Deleted

## 2017-01-15 ENCOUNTER — Telehealth: Payer: Self-pay | Admitting: Nurse Practitioner

## 2017-01-15 DIAGNOSIS — R1031 Right lower quadrant pain: Secondary | ICD-10-CM

## 2017-01-15 NOTE — Telephone Encounter (Signed)
-----   Message from Octavio Manns sent at 01/14/2017 12:11 PM EDT ----- The [place] for the pelvis needs to change to Pelvis Complete IMG [id number]

## 2017-01-18 ENCOUNTER — Ambulatory Visit
Admission: RE | Admit: 2017-01-18 | Discharge: 2017-01-18 | Disposition: A | Discharge status: Home / Self Care | Payer: No Typology Code available for payment source | Source: Ambulatory Visit | Attending: Nurse Practitioner | Admitting: Nurse Practitioner

## 2017-01-18 ENCOUNTER — Encounter: Payer: Self-pay | Admitting: Nurse Practitioner

## 2017-01-18 DIAGNOSIS — R1031 Right lower quadrant pain: Secondary | ICD-10-CM

## 2017-01-21 NOTE — Addendum Note (Signed)
Addended by: Leana Gamer on: 01/21/2017 08:13 AM   Modules accepted: Orders

## 2017-01-25 ENCOUNTER — Other Ambulatory Visit (INDEPENDENT_AMBULATORY_CARE_PROVIDER_SITE_OTHER): Payer: No Typology Code available for payment source

## 2017-01-25 DIAGNOSIS — Z0001 Encounter for general adult medical examination with abnormal findings: Secondary | ICD-10-CM

## 2017-01-25 DIAGNOSIS — E782 Mixed hyperlipidemia: Secondary | ICD-10-CM

## 2017-01-25 DIAGNOSIS — R739 Hyperglycemia, unspecified: Secondary | ICD-10-CM

## 2017-01-25 DIAGNOSIS — M8580 Other specified disorders of bone density and structure, unspecified site: Secondary | ICD-10-CM

## 2017-01-25 LAB — CBC WITH DIFFERENTIAL/PLATELET
BASOS ABS: 0.1 10*3/uL (ref 0.0–0.1)
Basophils Relative: 1.1 % (ref 0.0–3.0)
EOS PCT: 5.5 % — AB (ref 0.0–5.0)
Eosinophils Absolute: 0.3 10*3/uL (ref 0.0–0.7)
HEMATOCRIT: 40.1 % (ref 36.0–46.0)
HEMOGLOBIN: 13 g/dL (ref 12.0–15.0)
LYMPHS PCT: 33.9 % (ref 12.0–46.0)
Lymphs Abs: 2.1 10*3/uL (ref 0.7–4.0)
MCHC: 32.5 g/dL (ref 30.0–36.0)
MCV: 78.6 fl (ref 78.0–100.0)
MONOS PCT: 9.4 % (ref 3.0–12.0)
Monocytes Absolute: 0.6 10*3/uL (ref 0.1–1.0)
Neutro Abs: 3.1 10*3/uL (ref 1.4–7.7)
Neutrophils Relative %: 50.1 % (ref 43.0–77.0)
Platelets: 238 10*3/uL (ref 150.0–400.0)
RBC: 5.1 Mil/uL (ref 3.87–5.11)
RDW: 14.7 % (ref 11.5–15.5)
WBC: 6.2 10*3/uL (ref 4.0–10.5)

## 2017-01-25 LAB — COMPREHENSIVE METABOLIC PANEL
ALK PHOS: 91 U/L (ref 39–117)
ALT: 13 U/L (ref 0–35)
AST: 18 U/L (ref 0–37)
Albumin: 4.4 g/dL (ref 3.5–5.2)
BILIRUBIN TOTAL: 0.5 mg/dL (ref 0.2–1.2)
BUN: 15 mg/dL (ref 6–23)
CALCIUM: 10.2 mg/dL (ref 8.4–10.5)
CO2: 28 mEq/L (ref 19–32)
Chloride: 102 mEq/L (ref 96–112)
Creatinine, Ser: 0.7 mg/dL (ref 0.40–1.20)
GFR: 91.77 mL/min (ref >60.00)
GLUCOSE: 84 mg/dL (ref 70–99)
POTASSIUM: 4 meq/L (ref 3.5–5.1)
Sodium: 138 mEq/L (ref 135–145)
TOTAL PROTEIN: 7.3 g/dL (ref 6.0–8.3)

## 2017-01-25 LAB — LIPID PANEL
Cholesterol: 182 mg/dL (ref 0–200)
HDL: 52.4 mg/dL (ref >39.00)
LDL Cholesterol: 104 mg/dL — ABNORMAL HIGH (ref 0–99)
NONHDL: 129.36
TRIGLYCERIDES: 126 mg/dL (ref 0.0–149.0)
Total CHOL/HDL Ratio: 3
VLDL: 25.2 mg/dL (ref 0.0–40.0)

## 2017-01-25 LAB — VITAMIN D 25 HYDROXY (VIT D DEFICIENCY, FRACTURES): VITD: 35.72 ng/mL (ref 30.00–100.00)

## 2017-01-25 LAB — HEMOGLOBIN A1C: HEMOGLOBIN A1C: 6.2 % (ref 4.6–6.5)

## 2017-01-26 LAB — CALCIUM, IONIZED: CALCIUM ION: 5.4 mg/dL (ref 4.8–5.6)

## 2017-03-21 ENCOUNTER — Ambulatory Visit (INDEPENDENT_AMBULATORY_CARE_PROVIDER_SITE_OTHER)
Admission: RE | Admit: 2017-03-21 | Discharge: 2017-03-21 | Disposition: A | Discharge status: Home / Self Care | Payer: Self-pay | Source: Ambulatory Visit | Attending: Family Medicine | Admitting: Family Medicine

## 2017-03-21 ENCOUNTER — Ambulatory Visit (INDEPENDENT_AMBULATORY_CARE_PROVIDER_SITE_OTHER): Payer: Self-pay | Admitting: Family Medicine

## 2017-03-21 ENCOUNTER — Encounter: Payer: Self-pay | Admitting: Family Medicine

## 2017-03-21 VITALS — BP 122/68 | HR 78 | Temp 98.5°F | Ht 67.0 in | Wt 123.0 lb

## 2017-03-21 DIAGNOSIS — M25532 Pain in left wrist: Secondary | ICD-10-CM

## 2017-03-21 NOTE — Patient Instructions (Signed)
Thank you for coming in,   We will call you with the results from today.   Please follow up with me if your symptoms do not seem to improve.    Please feel free to call with any questions or concerns at any time, at [phone]. --Dr. Raeford Razor

## 2017-03-21 NOTE — Progress Notes (Signed)
Cathy Woodward - 56 y.o. female MRN [id number]  Date of birth: 07-Aug-1960  SUBJECTIVE:  Including CC & ROS.  Chief Complaint  Patient presents with  . Left wrist pain    she was wearing thump slpint at night, helped with tendon pain not bone pain. Pain has been present since August.  Denies injury.      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 The pain has been occurring intermittently. It is sharp in nature and located on the radial styloid with some extension to the thumb. Pain is worse with typing. She has tried using a brace with limited improvement. Has not taken any anti-inflammatories. Denies any injury or surgery. Pain is moderate in nature. Denies any specific injury.  Review of Systems  Musculoskeletal: Negative for gait problem and joint swelling.  Skin: Negative for color change.  Neurological: Negative for weakness and numbness.  Hematological: Negative for adenopathy.    HISTORY: Past Medical, Surgical, Social, and Family History Reviewed & Updated per EMR.   Pertinent Historical Findings include:  Past Medical History:  Diagnosis Date  . Abnormal EKG    septal Q waves  . Allergic rhinitis   . Brachial neuritis or radiculitis NOS   . 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 Chickenpox   . GERD (gastroesophageal reflux disease)   . Headache    migraines hx  . Heart murmur   . Hx of migraines   . Hyperlipidemia   . Leiomyoma of uterus, unspecified   . MVP (mitral valve prolapse)   . Palpitations    occasional  . Right-sided sensorineural hearing loss   . Scrofula    1 yr chest Xrays   . Shortness of breath dyspnea    d/t sinus issues  . Temporomandibular joint disorders, unspecified     Past Surgical History:  Procedure Laterality Date  . ABDOMINAL HYSTERECTOMY    . BREAST BIOPSY    . Excision of cervical lymph node anterior cervical chain    . MASTECTOMY Right 02/02/2016  . MOLE  REMOVAL     buttock  . SIMPLE MASTECTOMY WITH AXILLARY SENTINEL NODE BIOPSY Right 02/02/2016   Procedure: RIGHT SIMPLE MASTECTOMY WITH AXILLARY SENTINEL NODE BIOPSY;  Surgeon: Erroll Luna, MD;  Location: [facility];  Service: General;  Laterality: Right;    Allergies  Allergen Reactions  . Doxycycline Other (See Comments)    UNSPECIFIED REACTION   . Erythromycin Other (See Comments)    UNSPECIFIED REACTION   . Minocin [Minocycline Hcl] Other (See Comments)    UNSPECIFIED REACTION   . Aspirin Other (See Comments)    VERTIGO  . Lansoprazole Rash  . Latex Rash  . Nickel Rash  . Vitamin E Rash    ONLY TOPICAL AGENT    Family History  Problem Relation Age of Onset  . Rheumatologic disease Mother   . Heart attack Mother        Anterio septal MI '98  . Thyroid disease Mother        hypo  . Hypertension Mother   . Diabetes Father   . Esophageal cancer Father   . Breast cancer Maternal Aunt   . Colon cancer Other   . Renal cancer Cousin   . Colon cancer Child   . Tongue cancer Other   . Cancer Neg Hx   . Asthma Neg Hx   . Hyperlipidemia Neg Hx  Social History   Social History  . Marital status: Single    Spouse name: N/A  . Number of children: N/A  . Years of education: 80   Occupational History  . physician-internist     [employer]   Social History Main Topics  . Smoking status: Never Smoker  . Smokeless tobacco: Never Used  . Alcohol use Yes     Comment: occ, rare  . Drug use: No  . Sexual activity: No   Other Topics Concern  . 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 [employer] years 3-4 medical school. 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 Traveled to [place] in Sept '11. Single - has a house in [place] - home base.     PHYSICAL EXAM:  VS: BP 122/68 (BP Location: Left Arm, Patient Position: Sitting, Cuff Size: Normal)   Pulse 78   Temp 98.5 F (36.9 C) (Oral)   Ht  5\' 7"  (1.702 m)   Wt 123 lb (55.8 kg)   SpO2 98%   BMI 19.26 kg/m  Physical Exam Gen: NAD, alert, cooperative with exam, well-appearing ENT: normal lips, normal nasal mucosa,  Eye: normal EOM, normal conjunctiva and lids CV:  no edema, +2 pedal pulses   Resp: no accessory muscle use, non-labored,   Skin: no rashes, no areas of induration  Neuro: normal tone, normal sensation to touch Psych:  normal insight, alert and oriented MSK:  Left wrist: Tender to palpation along the first ulcer compartment. No obvious ecchymosis or swelling. Normal thumb range of motion. Normal wrist range of motion. Normal grip strength. Positive Finkelstein test. Neurovascularly intact   Limited ultrasound: Left wrist:  Mild effusion around the first ulcer compartment. CMC joint with no significant arthritic change. Normal appearing 2-6 dose compartments  Summary: Findings suggestive of De Quervain's tenosynovitis  Ultrasound and interpretation by Clearance Coots, MD        ASSESSMENT & PLAN:   Left wrist pain Pain most likely associated with De Quervain's tenosynovitis. Does not appear to be intersection syndrome. She has a history of breast cancert. Does not appear to be associated with CMC arthritis. - Counseled on home exercises therapy. - X-rays today - Provided Pennsaid - If no improvement consider physical therapy versus injection.

## 2017-03-22 DIAGNOSIS — M25532 Pain in left wrist: Secondary | ICD-10-CM | POA: Insufficient documentation

## 2017-03-22 NOTE — Assessment & Plan Note (Signed)
Pain most likely associated with De Quervain's tenosynovitis. Does not appear to be intersection syndrome. She has a history of breast cancert. Does not appear to be associated with CMC arthritis. - Counseled on home exercises therapy. - X-rays today - Provided Pennsaid - If no improvement consider physical therapy versus injection.

## 2017-03-26 ENCOUNTER — Ambulatory Visit: Payer: Self-pay | Admitting: Internal Medicine

## 2017-05-10 ENCOUNTER — Telehealth: Payer: Self-pay | Admitting: Hematology and Oncology

## 2017-05-10 ENCOUNTER — Ambulatory Visit (HOSPITAL_BASED_OUTPATIENT_CLINIC_OR_DEPARTMENT_OTHER): Payer: Self-pay | Admitting: Hematology and Oncology

## 2017-05-10 DIAGNOSIS — Z170 Estrogen receptor positive status [ER+]: Secondary | ICD-10-CM

## 2017-05-10 DIAGNOSIS — Z79811 Long term (current) use of aromatase inhibitors: Secondary | ICD-10-CM

## 2017-05-10 DIAGNOSIS — R232 Flushing: Secondary | ICD-10-CM

## 2017-05-10 DIAGNOSIS — C50111 Malignant neoplasm of central portion of right female breast: Secondary | ICD-10-CM

## 2017-05-10 MED ORDER — VITAMIN D 1000 UNITS PO TABS: 1000.0000 [IU] | ORAL_TABLET | Freq: Every day | ORAL | Status: DC

## 2017-05-10 MED ORDER — ANASTROZOLE 1 MG PO TABS: 1.0000 mg | ORAL_TABLET | Freq: Every day | ORAL | 3 refills | Status: DC

## 2017-05-10 NOTE — Assessment & Plan Note (Signed)
Right mastectomy 02/02/2016: Invasive lobular cancer grade 1, 2.5 cm, involves nipple submucosa, LVI present, LCIS, margins negative, 0/1 lymph node, T2 N0 stage II a, ER 70%, PR 20%, HER-2 negative, Ki-67 5%   Oncotype DX score26, intermediate risk, 17% risk of recurrence with tamoxifen alone (Offered adjuvant chemo with TC: Patient refused)  Current Treatment: Anastrozole 1 mg daily started 03/08/16 Anastrozole Toxicities: Occasional hot flashes Denies any myalgias  Breast cancer surveillance:  1.   Breast exam 05/10/2017: Benign 2. annual mammograms 09/03/2016: Benign, breast density category C 3. Annual breast MRIs in December  Return to clinic in 1 year for follow-up

## 2017-05-10 NOTE — Telephone Encounter (Signed)
Patient declined AVS and calendar of upcoming December 2019 appointments.

## 2017-05-10 NOTE — Progress Notes (Signed)
Patient Care Team: [name], MD as PCP - General (Internal Medicine) Delsa Bern, MD (Obstetrics and Gynecology) Sharyne Peach, MD (Ophthalmology) Juanita Craver, MD (Gastroenterology) Erroll Luna, MD as Consulting Physician (General Surgery) Nicholas Lose, MD as Consulting Physician (Hematology and Oncology) Kyung Rudd, MD as Consulting Physician (Radiation Oncology) Delice Bison Charlestine Massed, NP as Nurse Practitioner (Hematology and Oncology)  DIAGNOSIS:  Encounter Diagnosis  Name Primary?  . Malignant neoplasm of central portion of right breast in female, estrogen receptor positive ([place])     SUMMARY OF ONCOLOGIC HISTORY:   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 ([place])   12/28/2015 Initial Diagnosis    Right breast biopsy retroareolar: Invasive lobular cancer with LCIS grade 1 EF 70%, PR 20%, Ki-67 5%, HER-2 negative, ratio 1.17; screening call back for distortion, ultrasound 1 x 0.8 x 0.8 cm, T1b N0 stage IA clinical stage      02/02/2016 Surgery    Right mastectomy: Invasive lobular cancer grade 1, 2.5 cm, involves nipple submucosa, LVI present, LCIS, margins negative, 0/1 lymph node, T2 N0 stage II a, ER 70%, PR 20%, HER-2 negative, Ki-67 5% , Oncotype DX score 26, 17% ROR intermediate risk      03/08/2016 -  Anti-estrogen oral therapy    Anastrozole 1 mg daily       CHIEF COMPLIANT: Follow-up on anastrozole therapy  INTERVAL HISTORY: Cathy Woodward is a 56 year old with above-mentioned history of invasive lobular cancer who underwent mastectomy and is currently on anastrozole therapy and appears to be tolerating extremely well.  She does have hot flashes as well as the intermittent tendinitis especially in the left thumb.  REVIEW OF SYSTEMS:   Constitutional: Denies fevers, chills or abnormal weight loss Eyes: Denies blurriness of vision Ears, nose, mouth, throat, and face: Denies mucositis or sore throat Respiratory: Denies cough, dyspnea  or wheezes Cardiovascular: Denies palpitation, chest discomfort Gastrointestinal:  Denies nausea, heartburn or change in bowel habits Skin: Denies abnormal skin rashes Lymphatics: Denies new lymphadenopathy or easy bruising Neurological:Denies numbness, tingling or new weaknesses Behavioral/Psych: Mood is stable, no new changes  Extremities: Left thumb pain tendinitis Breast:  denies any pain or lumps or nodules in either breasts All other systems were reviewed with the patient and are negative.  I have reviewed the past medical history, past surgical history, social history and family history with the patient and they are unchanged from previous note.  ALLERGIES:  is allergic to doxycycline; erythromycin; minocin [minocycline hcl]; aspirin; lansoprazole; latex; nickel; and vitamin e.  MEDICATIONS:  Current Outpatient Medications  Medication Sig Dispense Refill  . anastrozole (ARIMIDEX) 1 MG tablet Take 1 tablet (1 mg total) by mouth daily. 90 tablet 3  . Ascorbic Acid (VITAMIN C) 500 MG tablet Take 500 mg by mouth daily.      Marland Kitchen atorvastatin (LIPITOR) 10 MG tablet Take 1 tablet (10 mg total) by mouth 3 (three) times a week. 45 tablet 3  . cyclobenzaprine (FLEXERIL) 10 MG tablet Take 1 tablet (10 mg total) by mouth 3 (three) times daily as needed for muscle spasms. 90 tablet 6  . fexofenadine (ALLEGRA) 60 MG tablet Take 1 tablet (60 mg total) by mouth 2 (two) times daily. (Patient taking differently: Take 60 mg by mouth 2 (two) times daily as needed for allergies. ) 60 tablet 4  . fluticasone (FLONASE) 50 MCG/ACT nasal spray Place 2 sprays into the nose daily. 48 g 3  . Multiple Vitamin (MULTIVITAMIN) tablet Take 1 tablet by  mouth daily.    . naproxen sodium (ANAPROX) 220 MG tablet Take 440 mg by mouth 2 (two) times daily as needed (pain).    . ranitidine (ZANTAC) 150 MG tablet Take 1 tablet (150 mg total) by mouth 2 (two) times daily. 180 tablet 3  . sodium chloride (OCEAN) 0.65 % SOLN  nasal spray Place 2 sprays into both nostrils daily.     Marland Kitchen tiZANidine (ZANAFLEX) 4 MG tablet Take 1 tablet (4 mg total) by mouth 3 (three) times daily. (Patient taking differently: Take 4 mg by mouth every 8 (eight) hours as needed for muscle spasms. ) 90 tablet 3  . triamterene-hydrochlorothiazide (MAXZIDE-25) 37.5-25 MG tablet Take 1 tablet by mouth 3 (three) times a week.     . vitamin E 400 UNIT capsule Take 400 Units by mouth daily.       No current facility-administered medications for this visit.     PHYSICAL EXAMINATION: ECOG PERFORMANCE STATUS: 1 - Symptomatic but completely ambulatory  Vitals:   05/10/17 1133  BP: 116/66  Pulse: 85  Resp: 12  Temp: (!) 97.3 F (36.3 C)  SpO2: 100%   Filed Weights   05/10/17 1133  Weight: 122 lb 3.2 oz (55.4 kg)    GENERAL:alert, no distress and comfortable SKIN: skin color, texture, turgor are normal, no rashes or significant lesions EYES: normal, Conjunctiva are pink and non-injected, sclera clear OROPHARYNX:no exudate, no erythema and lips, buccal mucosa, and tongue normal  NECK: supple, thyroid normal size, non-tender, without nodularity LYMPH:  no palpable lymphadenopathy in the cervical, axillary or inguinal LUNGS: clear to auscultation and percussion with normal breathing effort HEART: regular rate & rhythm and no murmurs and no lower extremity edema ABDOMEN:abdomen soft, non-tender and normal bowel sounds MUSCULOSKELETAL:no cyanosis of digits and no clubbing  NEURO: alert & oriented x 3 with fluent speech, no focal motor/sensory deficits EXTREMITIES: No lower extremity edema BREAST: Right mastectomy. (exam performed in the presence of a chaperone)  LABORATORY DATA:  I have reviewed the data as listed   Chemistry      Component Value Date/Time   NA 138 01/25/2017 0948   NA 141 01/04/2016 0818   K 4.0 01/25/2017 0948   K 3.8 01/04/2016 0818   CL 102 01/25/2017 0948   CO2 28 01/25/2017 0948   CO2 25 01/04/2016 0818    BUN 15 01/25/2017 0948   BUN 15.3 01/04/2016 0818   CREATININE 0.70 01/25/2017 0948   CREATININE 0.8 01/04/2016 0818      Component Value Date/Time   CALCIUM 10.2 01/25/2017 0948   CALCIUM 10.6 (H) 01/04/2016 0818   ALKPHOS 91 01/25/2017 0948   ALKPHOS 99 01/04/2016 0818   AST 18 01/25/2017 0948   AST 19 01/04/2016 0818   ALT 13 01/25/2017 0948   ALT 16 01/04/2016 0818   BILITOT 0.5 01/25/2017 0948   BILITOT 0.45 01/04/2016 0818       Lab Results  Component Value Date   WBC 6.2 01/25/2017   HGB 13.0 01/25/2017   HCT 40.1 01/25/2017   MCV 78.6 01/25/2017   PLT 238.0 01/25/2017   NEUTROABS 3.1 01/25/2017    ASSESSMENT & PLAN:  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 ([place]) Right mastectomy 02/02/2016: Invasive lobular cancer grade 1, 2.5 cm, involves nipple submucosa, LVI present, LCIS, margins negative, 0/1 lymph node, T2 N0 stage II a, ER 70%, PR 20%, HER-2 negative, Ki-67 5%   Oncotype DX score26, intermediate risk, 17% risk of recurrence with  tamoxifen alone (Offered adjuvant chemo with TC: Patient refused)  Current Treatment: Anastrozole 1 mg daily started 03/08/16 Anastrozole Toxicities: Occasional hot flashes Denies any myalgias  We discussed that there is no role of routine breast MRIs for surveillance.  Breast cancer surveillance:  1.   Breast exam 05/10/2017: Benign 2. annual mammograms 09/03/2016: Benign, breast density category C 3. Annual breast MRIs in December  Return to clinic in 1 year for follow-up     I spent 25 minutes talking to the patient of which more than half was spent in counseling and coordination of care.  No orders of the defined types were placed in this encounter.  The patient has a good understanding of the overall plan. she agrees with it. she will call with any problems that may develop before the next visit here.   Rulon Eisenmenger, MD 05/10/17

## 2017-07-09 ENCOUNTER — Other Ambulatory Visit: Payer: Self-pay | Admitting: Surgery

## 2017-07-09 DIAGNOSIS — Z1231 Encounter for screening mammogram for malignant neoplasm of breast: Secondary | ICD-10-CM

## 2017-07-29 ENCOUNTER — Ambulatory Visit
Admission: RE | Admit: 2017-07-29 | Discharge: 2017-07-29 | Disposition: A | Discharge status: Home / Self Care | Payer: No Typology Code available for payment source | Source: Ambulatory Visit | Attending: Surgery | Admitting: Surgery

## 2017-07-29 DIAGNOSIS — Z1231 Encounter for screening mammogram for malignant neoplasm of breast: Secondary | ICD-10-CM

## 2017-08-21 ENCOUNTER — Other Ambulatory Visit: Payer: Self-pay | Admitting: Internal Medicine

## 2017-09-12 ENCOUNTER — Encounter: Payer: Self-pay | Admitting: Internal Medicine

## 2017-09-12 MED ORDER — SCOPOLAMINE 1 MG/3DAYS TD PT72: 1.0000 | MEDICATED_PATCH | TRANSDERMAL | 0 refills | Status: DC

## 2017-10-14 ENCOUNTER — Telehealth: Payer: Self-pay | Admitting: Hematology and Oncology

## 2017-10-14 NOTE — Telephone Encounter (Signed)
Patient called to reschedule she is going out of town

## 2017-12-25 ENCOUNTER — Ambulatory Visit (INDEPENDENT_AMBULATORY_CARE_PROVIDER_SITE_OTHER): Payer: Self-pay | Admitting: Internal Medicine

## 2017-12-25 ENCOUNTER — Encounter: Payer: Self-pay | Admitting: Internal Medicine

## 2017-12-25 VITALS — BP 110/70 | HR 83 | Temp 98.0°F | Ht 67.0 in | Wt 118.0 lb

## 2017-12-25 DIAGNOSIS — E782 Mixed hyperlipidemia: Secondary | ICD-10-CM

## 2017-12-25 DIAGNOSIS — C50911 Malignant neoplasm of unspecified site of right female breast: Secondary | ICD-10-CM

## 2017-12-25 DIAGNOSIS — Z0000 Encounter for general adult medical examination without abnormal findings: Secondary | ICD-10-CM

## 2017-12-25 DIAGNOSIS — J3089 Other allergic rhinitis: Secondary | ICD-10-CM

## 2017-12-25 DIAGNOSIS — K219 Gastro-esophageal reflux disease without esophagitis: Secondary | ICD-10-CM

## 2017-12-25 MED ORDER — ATORVASTATIN CALCIUM 10 MG PO TABS: ORAL_TABLET | ORAL | 3 refills | Status: DC

## 2017-12-25 NOTE — Progress Notes (Signed)
   Subjective:    Patient ID: Cathy Woodward, female    DOB: [birth year], 57 y.o.   MRN: [id number]  HPI The patient is a 57 YO female coming in for physical. Needs breast MRI order.   PMH, [facility], social history reviewed and updated.   Review of Systems  Constitutional: Negative.   HENT: Negative.   Eyes: Negative.   Respiratory: Negative for cough, chest tightness and shortness of breath.   Cardiovascular: Negative for chest pain, palpitations and leg swelling.  Gastrointestinal: Negative for abdominal distention, abdominal pain, constipation, diarrhea, nausea and vomiting.  Musculoskeletal: Positive for arthralgias.  Skin: Negative.   Neurological: Negative.   Psychiatric/Behavioral: Negative.       Objective:   Physical Exam  Constitutional: She is oriented to person, place, and time. She appears well-developed and well-nourished.  HENT:  Head: Normocephalic and atraumatic.  Eyes: EOM are normal.  Neck: Normal range of motion.  Cardiovascular: Normal rate and regular rhythm.  Pulmonary/Chest: Effort normal and breath sounds normal. No respiratory distress. She has no wheezes. She has no rales.  Abdominal: Soft. Bowel sounds are normal. She exhibits no distension. There is no tenderness. There is no rebound.  Musculoskeletal: She exhibits no edema.  Neurological: She is alert and oriented to person, place, and time. Coordination normal.  Skin: Skin is warm and dry.  Psychiatric: She has a normal mood and affect.   Vitals:   12/25/17 0800  BP: 110/70  Pulse: 83  Temp: 98 F (36.7 C)  TempSrc: Oral  SpO2: 99%  Weight: 118 lb (53.5 kg)  Height: 5\' 7"  (1.702 m)      Assessment & Plan:

## 2017-12-25 NOTE — Patient Instructions (Signed)

## 2017-12-25 NOTE — Assessment & Plan Note (Signed)
Takes zantac prn.

## 2017-12-25 NOTE — Assessment & Plan Note (Signed)
Flu shot yearly. Pneumonia not indicated. Shingrix will wait. Tetanus up to date. Colonoscopy up to date. Mammogram needs breast MRI, pap smear up to date and dexa up to date. Counseled about sun safety and mole surveillance. Counseled about the dangers of distracted driving. Given 10 year screening recommendations.

## 2017-12-25 NOTE — Assessment & Plan Note (Signed)
Ordered breast MRI, still taking arimidex.

## 2017-12-25 NOTE — Assessment & Plan Note (Signed)
Refill atorvastatin which she takes 3 times per week. Checking lipid panel and adjust as needed.

## 2017-12-25 NOTE — Assessment & Plan Note (Signed)
Takes claritin and flonase. Still some symptoms.

## 2017-12-30 ENCOUNTER — Encounter: Payer: Self-pay | Admitting: Internal Medicine

## 2018-01-01 ENCOUNTER — Other Ambulatory Visit (INDEPENDENT_AMBULATORY_CARE_PROVIDER_SITE_OTHER): Payer: Self-pay

## 2018-01-01 DIAGNOSIS — Z0000 Encounter for general adult medical examination without abnormal findings: Secondary | ICD-10-CM

## 2018-01-01 LAB — COMPREHENSIVE METABOLIC PANEL
ALT: 20 U/L (ref 0–35)
AST: 21 U/L (ref 0–37)
Albumin: 4.5 g/dL (ref 3.5–5.2)
Alkaline Phosphatase: 96 U/L (ref 39–117)
BUN: 16 mg/dL (ref 6–23)
CO2: 31 mEq/L (ref 19–32)
Calcium: 10.4 mg/dL (ref 8.4–10.5)
Chloride: 102 mEq/L (ref 96–112)
Creatinine, Ser: 0.8 mg/dL (ref 0.40–1.20)
GFR: 78.4 mL/min (ref >60.00)
Glucose, Bld: 90 mg/dL (ref 70–99)
POTASSIUM: 4.3 meq/L (ref 3.5–5.1)
SODIUM: 140 meq/L (ref 135–145)
TOTAL PROTEIN: 7.8 g/dL (ref 6.0–8.3)
Total Bilirubin: 0.8 mg/dL (ref 0.2–1.2)

## 2018-01-01 LAB — LIPID PANEL
Cholesterol: 230 mg/dL — ABNORMAL HIGH (ref 0–200)
HDL: 61.1 mg/dL (ref >39.00)
LDL Cholesterol: 136 mg/dL — ABNORMAL HIGH (ref 0–99)
NONHDL: 168.95
Total CHOL/HDL Ratio: 4
Triglycerides: 166 mg/dL — ABNORMAL HIGH (ref 0.0–149.0)
VLDL: 33.2 mg/dL (ref 0.0–40.0)

## 2018-01-01 LAB — CBC
HCT: 41.8 % (ref 36.0–46.0)
Hemoglobin: 13.7 g/dL (ref 12.0–15.0)
MCHC: 32.8 g/dL (ref 30.0–36.0)
MCV: 77.6 fl — AB (ref 78.0–100.0)
Platelets: 255 10*3/uL (ref 150.0–400.0)
RBC: 5.39 Mil/uL — AB (ref 3.87–5.11)
RDW: 14.5 % (ref 11.5–15.5)
WBC: 6.7 10*3/uL (ref 4.0–10.5)

## 2018-01-01 LAB — HEMOGLOBIN A1C: HEMOGLOBIN A1C: 6.2 % (ref 4.6–6.5)

## 2018-01-06 ENCOUNTER — Ambulatory Visit
Admission: RE | Admit: 2018-01-06 | Discharge: 2018-01-06 | Disposition: A | Discharge status: Home / Self Care | Payer: No Typology Code available for payment source | Source: Ambulatory Visit | Attending: Internal Medicine | Admitting: Internal Medicine

## 2018-01-06 DIAGNOSIS — Z0000 Encounter for general adult medical examination without abnormal findings: Secondary | ICD-10-CM

## 2018-01-06 MED ORDER — GADOBENATE DIMEGLUMINE 529 MG/ML IV SOLN
10.0000 mL | Freq: Once | INTRAVENOUS | Status: AC | PRN
Start: 2018-01-06 — End: 2018-01-06
  Administered 2018-01-06: 10 mL via INTRAVENOUS

## 2018-03-26 ENCOUNTER — Telehealth: Payer: Self-pay | Admitting: Hematology and Oncology

## 2018-03-26 NOTE — Telephone Encounter (Signed)
patient called to request this change of date

## 2018-04-04 ENCOUNTER — Other Ambulatory Visit: Payer: Self-pay | Admitting: Internal Medicine

## 2018-04-21 ENCOUNTER — Inpatient Hospital Stay: Payer: Self-pay | Attending: Hematology and Oncology | Admitting: Hematology and Oncology

## 2018-04-21 DIAGNOSIS — N951 Menopausal and female climacteric states: Secondary | ICD-10-CM | POA: Insufficient documentation

## 2018-04-21 DIAGNOSIS — Z79811 Long term (current) use of aromatase inhibitors: Secondary | ICD-10-CM | POA: Insufficient documentation

## 2018-04-21 DIAGNOSIS — Z79899 Other long term (current) drug therapy: Secondary | ICD-10-CM | POA: Insufficient documentation

## 2018-04-21 DIAGNOSIS — Z170 Estrogen receptor positive status [ER+]: Secondary | ICD-10-CM | POA: Insufficient documentation

## 2018-04-21 DIAGNOSIS — C50111 Malignant neoplasm of central portion of right female breast: Secondary | ICD-10-CM | POA: Insufficient documentation

## 2018-04-21 MED ORDER — ANASTROZOLE 1 MG PO TABS: 1.0000 mg | ORAL_TABLET | Freq: Every day | ORAL | 3 refills | Status: DC

## 2018-04-21 NOTE — Assessment & Plan Note (Signed)
Right mastectomy 02/02/2016: Invasive lobular cancer grade 1, 2.5 cm, involves nipple submucosa, LVI present, LCIS, margins negative, 0/1 lymph node, T2 N0 stage II a, ER 70%, PR 20%, HER-2 negative, Ki-67 5%   Oncotype DX score26, intermediate risk, 17% risk of recurrence with tamoxifen alone (Offered adjuvant chemo with TC: Patient refused)  Current Treatment: Anastrozole 1 mg daily started 03/08/16  Anastrozole Toxicities: Occasional hot flashes Denies any myalgias  We discussed that there is no role of routine breast MRIs for surveillance.  Breast cancer surveillance: 1.Breast exam 04/21/2018: Benign 2.annual mammograms  07/29/2017: Benign, breast density category C 3.Annual breast MRIs 01/06/2018: No evidence of malignancy  Return to clinic in 1 year for follow-up

## 2018-04-21 NOTE — Progress Notes (Signed)
Patient Care Team: [name], MD as PCP - General (Internal Medicine) Delsa Bern, MD (Obstetrics and Gynecology) Sharyne Peach, MD (Ophthalmology) Juanita Craver, MD (Gastroenterology) Erroll Luna, MD as Consulting Physician (General Surgery) Nicholas Lose, MD as Consulting Physician (Hematology and Oncology) Kyung Rudd, MD as Consulting Physician (Radiation Oncology) Delice Bison Charlestine Massed, NP as Nurse Practitioner (Hematology and Oncology)  DIAGNOSIS:  Encounter Diagnosis  Name Primary?  . 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 EF 70%, PR 20%, Ki-67 5%, HER-2 negative, ratio 1.17; screening call back for distortion, ultrasound 1 x 0.8 x 0.8 cm, T1b N0 stage IA clinical stage    02/02/2016 Surgery    Right mastectomy: Invasive lobular cancer grade 1, 2.5 cm, involves nipple submucosa, LVI present, LCIS, margins negative, 0/1 lymph node, T2 N0 stage II a, ER 70%, PR 20%, HER-2 negative, Ki-67 5% , Oncotype DX score 26, 17% ROR intermediate risk    03/08/2016 -  Anti-estrogen oral therapy    Anastrozole 1 mg daily     CHIEF COMPLIANT: Follow-up on anastrozole therapy  INTERVAL HISTORY: Cathy Woodward is a 54-year with above-mentioned history of right breast cancer treated with mastectomy and is currently on anastrozole therapy.  She is tolerating extremely well.  She has slight tenosynovitis of the left wrist.  Denies any lumps or nodules in the breast.  Mammogram and MRI were normal.  REVIEW OF SYSTEMS:   Constitutional: Denies fevers, chills or abnormal weight loss Eyes: Denies blurriness of vision Ears, nose, mouth, throat, and face: Denies mucositis or sore throat Respiratory: Denies cough, dyspnea  or wheezes Cardiovascular: Denies palpitation, chest discomfort Gastrointestinal:  Denies nausea, heartburn or change in bowel habits Skin: Denies abnormal skin rashes Lymphatics: Denies new lymphadenopathy or easy bruising Neurological:Denies numbness, tingling or new weaknesses Behavioral/Psych: Mood is stable, no new changes  Extremities: No lower extremity edema Breast:  denies any pain or lumps or nodules in either breasts All other systems were reviewed with the patient and are negative.  I have reviewed the past medical history, past surgical history, social history and family history with the patient and they are unchanged from previous note.  ALLERGIES:  is allergic to doxycycline; erythromycin; minocin [minocycline hcl]; aspirin; lansoprazole; latex; nickel; and vitamin e.  MEDICATIONS:  Current Outpatient Medications  Medication Sig Dispense Refill  . anastrozole (ARIMIDEX) 1 MG tablet Take 1 tablet (1 mg total) by mouth daily. 90 tablet 3  . Ascorbic Acid (VITAMIN C) 500 MG tablet Take 500 mg by mouth daily.      Marland Kitchen atorvastatin (LIPITOR) 10 MG tablet TAKE 1 TABLET BY MOUTH 3 TIMES PER WEEK 45 tablet 3  . cholecalciferol (VITAMIN D) 1000 units tablet Take 1 tablet (1,000 Units total) by mouth daily.    . cyclobenzaprine (FLEXERIL) 10 MG tablet Take 1 tablet (10 mg total) by mouth 3 (three) times daily as needed for muscle spasms. 90 tablet 6  . fluticasone (FLONASE) 50 MCG/ACT nasal spray Place 2 sprays into the nose daily. 48 g 3  . loratadine (CLARITIN) 10 MG tablet Take 10 mg by mouth daily.    . Multiple Vitamin (MULTIVITAMIN) tablet Take 1 tablet by mouth daily.    . naproxen sodium (ANAPROX)  220 MG tablet Take 440 mg by mouth 2 (two) times daily as needed (pain).    . ranitidine (ZANTAC) 150 MG tablet Take 1 tablet (150 mg total) by mouth 2 (two) times daily. 180 tablet 3  . scopolamine (TRANSDERM-SCOP, 1.5 MG,) 1 MG/3DAYS Place 1 patch (1.5 mg total) onto the skin every  3 (three) days. 2 patch 0  . sodium chloride (OCEAN) 0.65 % SOLN nasal spray Place 2 sprays into both nostrils daily.     Marland Kitchen tiZANidine (ZANAFLEX) 4 MG tablet Take 1 tablet (4 mg total) by mouth 3 (three) times daily. (Patient taking differently: Take 4 mg by mouth every 8 (eight) hours as needed for muscle spasms. ) 90 tablet 3  . triamterene-hydrochlorothiazide (MAXZIDE-25) 37.5-25 MG tablet Take 1 tablet by mouth 3 (three) times a week.     . vitamin E 400 UNIT capsule Take 400 Units by mouth daily.       No current facility-administered medications for this visit.     PHYSICAL EXAMINATION: ECOG PERFORMANCE STATUS: 1 - Symptomatic but completely ambulatory  Vitals:   04/21/18 0911  BP: 107/68  Pulse: 83  Resp: 18  Temp: 98 F (36.7 C)  SpO2: 100%   Filed Weights   04/21/18 0911  Weight: 119 lb 8 oz (54.2 kg)    GENERAL:alert, no distress and comfortable SKIN: skin color, texture, turgor are normal, no rashes or significant lesions EYES: normal, Conjunctiva are pink and non-injected, sclera clear OROPHARYNX:no exudate, no erythema and lips, buccal mucosa, and tongue normal  NECK: supple, thyroid normal size, non-tender, without nodularity LYMPH:  no palpable lymphadenopathy in the cervical, axillary or inguinal LUNGS: clear to auscultation and percussion with normal breathing effort HEART: regular rate & rhythm and no murmurs and no lower extremity edema ABDOMEN:abdomen soft, non-tender and normal bowel sounds MUSCULOSKELETAL:no cyanosis of digits and no clubbing  NEURO: alert & oriented x 3 with fluent speech, no focal motor/sensory deficits EXTREMITIES: No lower extremity edema BREAST right mastectomy, no palpable lumps or nodules in the left breast. (exam performed in the presence of a chaperone)  LABORATORY DATA:  I have reviewed the data as listed CMP Latest Ref Rng & Units 01/01/2018 01/25/2017 01/04/2016  Glucose 70 - 99 mg/dL 90 84 83  BUN 6 - 23 mg/dL 16 15 15.3    Creatinine 0.40 - 1.20 mg/dL 0.80 0.70 0.8  Sodium 135 - 145 mEq/L 140 138 141  Potassium 3.5 - 5.1 mEq/L 4.3 4.0 3.8  Chloride 96 - 112 mEq/L 102 102 -  CO2 19 - 32 mEq/L '31 28 25  '$ Calcium 8.4 - 10.5 mg/dL 10.4 10.2 10.6(H)  Total Protein 6.0 - 8.3 g/dL 7.8 7.3 8.5(H)  Total Bilirubin 0.2 - 1.2 mg/dL 0.8 0.5 0.45  Alkaline Phos 39 - 117 U/L 96 91 99  AST 0 - 37 U/L '21 18 19  '$ ALT 0 - 35 U/L '20 13 16    '$ Lab Results  Component Value Date   WBC 6.7 01/01/2018   HGB 13.7 01/01/2018   HCT 41.8 01/01/2018   MCV 77.6 (L) 01/01/2018   PLT 255.0 01/01/2018   NEUTROABS 3.1 01/25/2017    ASSESSMENT & PLAN:  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 ([place]) Right mastectomy 02/02/2016: Invasive lobular cancer grade 1, 2.5 cm, involves nipple submucosa, LVI present, LCIS, margins negative, 0/1 lymph node, T2 N0 stage II a, ER 70%, PR 20%, HER-2 negative, Ki-67 5%   Oncotype DX score26, intermediate risk,  17% risk of recurrence with tamoxifen alone (Offered adjuvant chemo with TC: Patient refused)  Current Treatment: Anastrozole 1 mg daily started 03/08/16  Anastrozole Toxicities: Occasional hot flashes Slight tenosynovitis of the left wrist   Breast cancer surveillance: 1.Breast exam 04/21/2018: Benign 2.annual mammograms  07/29/2017: Benign, breast density category C 3. breast MRIs 01/06/2018: No evidence of malignancy Plan to do breast MRIs every other year  Return to clinic in 1 year for follow-up    No orders of the defined types were placed in this encounter.  The patient has a good understanding of the overall plan. she agrees with it. she will call with any problems that may develop before the next visit here.   Harriette Ohara, MD 04/21/18

## 2018-04-28 ENCOUNTER — Ambulatory Visit: Payer: No Typology Code available for payment source | Admitting: Hematology and Oncology

## 2018-05-02 ENCOUNTER — Ambulatory Visit: Payer: No Typology Code available for payment source | Admitting: Hematology and Oncology

## 2018-07-01 ENCOUNTER — Other Ambulatory Visit: Payer: Self-pay | Admitting: Surgery

## 2018-07-01 DIAGNOSIS — Z1231 Encounter for screening mammogram for malignant neoplasm of breast: Secondary | ICD-10-CM

## 2018-08-14 ENCOUNTER — Ambulatory Visit (INDEPENDENT_AMBULATORY_CARE_PROVIDER_SITE_OTHER): Payer: Self-pay | Admitting: Internal Medicine

## 2018-08-14 ENCOUNTER — Other Ambulatory Visit: Payer: Self-pay

## 2018-08-14 ENCOUNTER — Encounter: Payer: Self-pay | Admitting: Internal Medicine

## 2018-08-14 VITALS — BP 118/60 | HR 84 | Temp 98.5°F | Ht 67.0 in

## 2018-08-14 DIAGNOSIS — R6889 Other general symptoms and signs: Secondary | ICD-10-CM | POA: Insufficient documentation

## 2018-08-14 LAB — POC INFLUENZA A&B (BINAX/QUICKVUE)
Influenza A, POC: NEGATIVE
Influenza B, POC: NEGATIVE

## 2018-08-14 NOTE — Assessment & Plan Note (Signed)
POC flu testing done in office was negative. Covid-19 screening done in the office and PUI forms filled out and log given to patient as well as expectations and timeline for results.

## 2018-08-14 NOTE — Addendum Note (Signed)
Addended by: Pricilla Holm A on: 08/14/2018 03:10 PM   Modules accepted: Orders

## 2018-08-14 NOTE — Progress Notes (Signed)
   Subjective:   Patient ID: Cathy Woodward, female    DOB: 1960/09/12, 58 y.o.   MRN: [id number]  HPI The patient is a 58 y.o. female coming in for cold symptoms. Started about 1 day ago. She had coughing fit. Denies SOB. 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 No travel recently. No large crowds or conferences recently. Lives with mother and is concerned about potential exposure to her. She denies fevers but felt off today. Has chronic headaches and headaches in the last 2 days. Overall it is stable since onset. Has tried claritin and zantac which have not helped. Sought advice from health department as well as her clinic administrator and was advised to get evaluated and tested.   Review of Systems  Constitutional: Positive for activity change and appetite change. Negative for chills, fatigue, fever and unexpected weight change.  HENT: Positive for congestion, postnasal drip, rhinorrhea and sinus pressure. Negative for ear discharge, ear pain, sinus pain, sneezing, sore throat, tinnitus, trouble swallowing and voice change.   Eyes: Negative.   Respiratory: Positive for cough. Negative for chest tightness, shortness of breath and wheezing.   Cardiovascular: Negative.   Gastrointestinal: Negative.   Musculoskeletal: Positive for myalgias.  Neurological: Positive for headaches.    Objective:  Physical Exam Constitutional:      Appearance: She is well-developed.  HENT:     Head: Normocephalic and atraumatic.     Comments: Oropharynx with redness and clear drainage, nose with swollen turbinates, TMs normal bilaterally.  Neck:     Musculoskeletal: Normal range of motion.     Thyroid: No thyromegaly.  Cardiovascular:     Rate and Rhythm: Normal rate and regular rhythm.  Pulmonary:     Effort: Pulmonary effort is normal. No respiratory  distress.     Breath sounds: Normal breath sounds. No wheezing or rales.  Abdominal:     Palpations: Abdomen is soft.  Musculoskeletal:        General: Tenderness present.  Lymphadenopathy:     Cervical: No cervical adenopathy.  Skin:    General: Skin is warm and dry.  Neurological:     Mental Status: She is alert and oriented to person, place, and time.     Vitals:   08/14/18 1351  BP: 118/60  Pulse: 84  Temp: 98.5 F (36.9 C)  TempSrc: Oral  SpO2: 98%  Height: 5\' 7"  (1.702 m)    Assessment & Plan:

## 2018-08-15 LAB — SARS CORONAVIRUS WITH COV-2 RNA, QUAL REAL TIME RT-PCR
Overall Result:: NOT DETECTED
Pan-SARS RNA: NEGATIVE
SARS-COV-2 RNA: NEGATIVE

## 2018-08-18 ENCOUNTER — Encounter: Payer: Self-pay | Admitting: Internal Medicine

## 2018-09-03 ENCOUNTER — Ambulatory Visit: Payer: Self-pay

## 2018-10-13 ENCOUNTER — Ambulatory Visit
Admission: RE | Admit: 2018-10-13 | Discharge: 2018-10-13 | Disposition: A | Discharge status: Home / Self Care | Payer: No Typology Code available for payment source | Source: Ambulatory Visit | Attending: Surgery | Admitting: Surgery

## 2018-10-13 ENCOUNTER — Other Ambulatory Visit: Payer: Self-pay

## 2018-10-13 DIAGNOSIS — Z1231 Encounter for screening mammogram for malignant neoplasm of breast: Secondary | ICD-10-CM

## 2018-12-29 ENCOUNTER — Ambulatory Visit: Payer: Self-pay

## 2019-01-07 ENCOUNTER — Ambulatory Visit: Payer: No Typology Code available for payment source | Admitting: Internal Medicine

## 2019-01-19 ENCOUNTER — Ambulatory Visit (INDEPENDENT_AMBULATORY_CARE_PROVIDER_SITE_OTHER): Payer: Self-pay | Admitting: Internal Medicine

## 2019-01-19 ENCOUNTER — Other Ambulatory Visit (INDEPENDENT_AMBULATORY_CARE_PROVIDER_SITE_OTHER): Payer: Self-pay

## 2019-01-19 ENCOUNTER — Encounter: Payer: Self-pay | Admitting: Internal Medicine

## 2019-01-19 ENCOUNTER — Other Ambulatory Visit: Payer: Self-pay

## 2019-01-19 VITALS — BP 108/68 | HR 70 | Temp 97.8°F | Ht 67.0 in | Wt 112.0 lb

## 2019-01-19 DIAGNOSIS — Z0000 Encounter for general adult medical examination without abnormal findings: Secondary | ICD-10-CM

## 2019-01-19 DIAGNOSIS — E782 Mixed hyperlipidemia: Secondary | ICD-10-CM

## 2019-01-19 DIAGNOSIS — C50911 Malignant neoplasm of unspecified site of right female breast: Secondary | ICD-10-CM

## 2019-01-19 LAB — COMPREHENSIVE METABOLIC PANEL
ALT: 15 U/L (ref 0–35)
AST: 19 U/L (ref 0–37)
Albumin: 4.7 g/dL (ref 3.5–5.2)
Alkaline Phosphatase: 90 U/L (ref 39–117)
BUN: 17 mg/dL (ref 6–23)
CO2: 26 mEq/L (ref 19–32)
Calcium: 10 mg/dL (ref 8.4–10.5)
Chloride: 102 mEq/L (ref 96–112)
Creatinine, Ser: 0.78 mg/dL (ref 0.40–1.20)
GFR: 75.67 mL/min (ref >60.00)
Glucose, Bld: 93 mg/dL (ref 70–99)
Potassium: 3.9 mEq/L (ref 3.5–5.1)
Sodium: 138 mEq/L (ref 135–145)
Total Bilirubin: 0.4 mg/dL (ref 0.2–1.2)
Total Protein: 7.7 g/dL (ref 6.0–8.3)

## 2019-01-19 LAB — CBC
HCT: 42.2 % (ref 36.0–46.0)
Hemoglobin: 13.6 g/dL (ref 12.0–15.0)
MCHC: 32.3 g/dL (ref 30.0–36.0)
MCV: 78.2 fl (ref 78.0–100.0)
Platelets: 237 10*3/uL (ref 150.0–400.0)
RBC: 5.4 Mil/uL — ABNORMAL HIGH (ref 3.87–5.11)
RDW: 14.4 % (ref 11.5–15.5)
WBC: 6.7 10*3/uL (ref 4.0–10.5)

## 2019-01-19 LAB — LIPID PANEL
Cholesterol: 227 mg/dL — ABNORMAL HIGH (ref 0–200)
HDL: 58.7 mg/dL (ref >39.00)
LDL Cholesterol: 140 mg/dL — ABNORMAL HIGH (ref 0–99)
NonHDL: 168.04
Total CHOL/HDL Ratio: 4
Triglycerides: 139 mg/dL (ref 0.0–149.0)
VLDL: 27.8 mg/dL (ref 0.0–40.0)

## 2019-01-19 LAB — HEMOGLOBIN A1C: Hgb A1c MFr Bld: 6.2 % (ref 4.6–6.5)

## 2019-01-19 NOTE — Patient Instructions (Signed)
Health Maintenance, Female Adopting a healthy lifestyle and getting preventive care are important in promoting health and wellness. Ask your health care provider about:  The right schedule for you to have regular tests and exams.  Things you can do on your own to prevent diseases and keep yourself healthy. What should I know about diet, weight, and exercise? Eat a healthy diet   Eat a diet that includes plenty of vegetables, fruits, low-fat dairy products, and lean protein.  Do not eat a lot of foods that are high in solid fats, added sugars, or sodium. Maintain a healthy weight Body mass index (BMI) is used to identify weight problems. It estimates body fat based on height and weight. Your health care provider can help determine your BMI and help you achieve or maintain a healthy weight. Get regular exercise Get regular exercise. This is one of the most important things you can do for your health. Most adults should:  Exercise for at least 150 minutes each week. The exercise should increase your heart rate and make you sweat (moderate-intensity exercise).  Do strengthening exercises at least twice a week. This is in addition to the moderate-intensity exercise.  Spend less time sitting. Even light physical activity can be beneficial. Watch cholesterol and blood lipids Have your blood tested for lipids and cholesterol at 59 years of age, then have this test every 5 years. Have your cholesterol levels checked more often if:  Your lipid or cholesterol levels are high.  You are older than 59 years of age.  You are at high risk for heart disease. What should I know about cancer screening? Depending on your health history and family history, you may need to have cancer screening at various ages. This may include screening for:  Breast cancer.  Cervical cancer.  Colorectal cancer.  Skin cancer.  Lung cancer. What should I know about heart disease, diabetes, and high blood  pressure? Blood pressure and heart disease  High blood pressure causes heart disease and increases the risk of stroke. This is more likely to develop in people who have high blood pressure readings, are of African descent, or are overweight.  Have your blood pressure checked: ? Every 3-5 years if you are 18-39 years of age. ? Every year if you are 40 years old or older. Diabetes Have regular diabetes screenings. This checks your fasting blood sugar level. Have the screening done:  Once every three years after age 40 if you are at a normal weight and have a low risk for diabetes.  More often and at a younger age if you are overweight or have a high risk for diabetes. What should I know about preventing infection? Hepatitis B If you have a higher risk for hepatitis B, you should be screened for this virus. Talk with your health care provider to find out if you are at risk for hepatitis B infection. Hepatitis C Testing is recommended for:  Everyone born from 1945 through 1965.  Anyone with known risk factors for hepatitis C. Sexually transmitted infections (STIs)  Get screened for STIs, including gonorrhea and chlamydia, if: ? You are sexually active and are younger than 59 years of age. ? You are older than 59 years of age and your health care provider tells you that you are at risk for this type of infection. ? Your sexual activity has changed since you were last screened, and you are at increased risk for chlamydia or gonorrhea. Ask your health care provider if   you are at risk.  Ask your health care provider about whether you are at high risk for HIV. Your health care provider may recommend a prescription medicine to help prevent HIV infection. If you choose to take medicine to prevent HIV, you should first get tested for HIV. You should then be tested every 3 months for as long as you are taking the medicine. Pregnancy  If you are about to stop having your period (premenopausal) and  you may become pregnant, seek counseling before you get pregnant.  Take 400 to 800 micrograms (mcg) of folic acid every day if you become pregnant.  Ask for birth control (contraception) if you want to prevent pregnancy. Osteoporosis and menopause Osteoporosis is a disease in which the bones lose minerals and strength with aging. This can result in bone fractures. If you are 65 years old or older, or if you are at risk for osteoporosis and fractures, ask your health care provider if you should:  Be screened for bone loss.  Take a calcium or vitamin D supplement to lower your risk of fractures.  Be given hormone replacement therapy (HRT) to treat symptoms of menopause. Follow these instructions at home: Lifestyle  Do not use any products that contain nicotine or tobacco, such as cigarettes, e-cigarettes, and chewing tobacco. If you need help quitting, ask your health care provider.  Do not use street drugs.  Do not share needles.  Ask your health care provider for help if you need support or information about quitting drugs. Alcohol use  Do not drink alcohol if: ? Your health care provider tells you not to drink. ? You are pregnant, may be pregnant, or are planning to become pregnant.  If you drink alcohol: ? Limit how much you use to 0-1 drink a day. ? Limit intake if you are breastfeeding.  Be aware of how much alcohol is in your drink. In the [place] one drink equals one 12 oz bottle of beer (355 mL), one 5 oz glass of wine (148 mL), or one 1 oz glass of hard liquor (44 mL). General instructions  Schedule regular health, dental, and eye exams.  Stay current with your vaccines.  Tell your health care provider if: ? You often feel depressed. ? You have ever been abused or do not feel safe at home. Summary  Adopting a healthy lifestyle and getting preventive care are important in promoting health and wellness.  Follow your health care provider's instructions about healthy  diet, exercising, and getting tested or screened for diseases.  Follow your health care provider's instructions on monitoring your cholesterol and blood pressure. This information is not intended to replace advice given to you by your health care provider. Make sure you discuss any questions you have with your health care provider. Document Released: 11/27/2010 Document Revised: 05/07/2018 Document Reviewed: 05/07/2018 Elsevier Patient Education  2020 [employer]  

## 2019-01-19 NOTE — Assessment & Plan Note (Signed)
Stopped lipitor currently but checking lipid panel today and adjust as needed.

## 2019-01-19 NOTE — Assessment & Plan Note (Signed)
Ordered MRI for October estimated. Taking arimidex and seeing oncology yearly.

## 2019-01-19 NOTE — Assessment & Plan Note (Signed)
Flu shot yearly with work. Shingrix counseled. Tetanus up to date. Colonoscopy up to date. Mammogram up to date, pap smear not indicated and dexa not indicated. Counseled about sun safety and mole surveillance. Counseled about the dangers of distracted driving. Given 10 year screening recommendations.   

## 2019-01-19 NOTE — Progress Notes (Signed)
   Subjective:   Patient ID: Cathy Woodward, female    DOB: 1961-03-05, 58 y.o.   MRN: [id number]  HPI The patient is a 58 YO female coming in for follow up breast cancer (taking arimidex currently on almost 3 years, s/p right mastectomy, tolerating well) and meniere's (takes maxzide prn, does not need refills at this time). No new concerns at this time.   Review of Systems  Constitutional: Negative.   HENT: Negative.   Eyes: Negative.   Respiratory: Negative for cough, chest tightness and shortness of breath.   Cardiovascular: Negative for chest pain, palpitations and leg swelling.  Gastrointestinal: Negative for abdominal distention, abdominal pain, constipation, diarrhea, nausea and vomiting.  Musculoskeletal: Negative.   Skin: Negative.   Neurological: Negative.   Psychiatric/Behavioral: Negative.     Objective:  Physical Exam Constitutional:      Appearance: She is well-developed.  HENT:     Head: Normocephalic and atraumatic.  Neck:     Musculoskeletal: Normal range of motion.  Cardiovascular:     Rate and Rhythm: Normal rate and regular rhythm.  Pulmonary:     Effort: Pulmonary effort is normal. No respiratory distress.     Breath sounds: Normal breath sounds. No wheezing or rales.  Abdominal:     General: Bowel sounds are normal. There is no distension.     Palpations: Abdomen is soft.     Tenderness: There is no abdominal tenderness. There is no rebound.  Skin:    General: Skin is warm and dry.  Neurological:     Mental Status: She is alert and oriented to person, place, and time.     Coordination: Coordination normal.     Vitals:   01/19/19 0805  BP: 108/68  Pulse: 70  Temp: 97.8 F (36.6 C)  TempSrc: Oral  SpO2: 98%  Weight: 112 lb (50.8 kg)  Height: 5\' 7"  (1.702 m)    Assessment & Plan:

## 2019-02-27 ENCOUNTER — Ambulatory Visit
Admission: RE | Admit: 2019-02-27 | Discharge: 2019-02-27 | Disposition: A | Discharge status: Home / Self Care | Payer: No Typology Code available for payment source | Source: Ambulatory Visit | Attending: Internal Medicine | Admitting: Internal Medicine

## 2019-02-27 DIAGNOSIS — C50911 Malignant neoplasm of unspecified site of right female breast: Secondary | ICD-10-CM

## 2019-02-27 MED ORDER — GADOBUTROL 1 MMOL/ML IV SOLN
5.0000 mL | Freq: Once | INTRAVENOUS | Status: AC | PRN
  Administered 2019-02-27: 5 mL via INTRAVENOUS

## 2019-04-22 ENCOUNTER — Other Ambulatory Visit: Payer: Self-pay | Admitting: Hematology and Oncology

## 2019-05-04 NOTE — Progress Notes (Signed)
Patient Care Team: [name], MD as PCP - General (Internal Medicine) Delsa Bern, MD (Obstetrics and Gynecology) Sharyne Peach, MD (Ophthalmology) Juanita Craver, MD (Gastroenterology) Erroll Luna, MD as Consulting Physician (General Surgery) Nicholas Lose, MD as Consulting Physician (Hematology and Oncology) Kyung Rudd, MD as Consulting Physician (Radiation Oncology) Gardenia Phlegm, NP as Nurse Practitioner (Hematology and Oncology)  DIAGNOSIS:    ICD-10-CM   1. Malignant neoplasm of central portion of right breast in female, estrogen receptor positive ([place])  C50.111    Z17.0     SUMMARY OF ONCOLOGIC HISTORY: Oncology History  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 ([place])  12/28/2015 Initial Diagnosis   Right breast biopsy retroareolar: Invasive lobular cancer with LCIS grade 1 EF 70%, PR 20%, Ki-67 5%, HER-2 negative, ratio 1.17; screening call back for distortion, ultrasound 1 x 0.8 x 0.8 cm, T1b N0 stage IA clinical stage   02/02/2016 Surgery   Right mastectomy: Invasive lobular cancer grade 1, 2.5 cm, involves nipple submucosa, LVI present, LCIS, margins negative, 0/1 lymph node, T2 N0 stage II a, ER 70%, PR 20%, HER-2 negative, Ki-67 5% , Oncotype DX score 26, 17% ROR intermediate risk   03/08/2016 -  Anti-estrogen oral therapy   Anastrozole 1 mg daily     CHIEF COMPLIANT: Follow-up of right breast cancer on anastrozole therapy  INTERVAL HISTORY: Cathy Woodward is a 58 y.o. with above-mentioned history of right breast cancer treated with mastectomy and who is currently on anastrozole therapy. Mammogram on 10/13/18 showed no evidence of malignancy in the left breast. Breast MRI on 02/27/19 showed no evidence of malignancy. She presents to the clinic today for annual follow-up.  She reports to me that the tendinitis on her left wrist has resolved.  REVIEW OF SYSTEMS:   Constitutional: Denies fevers, chills or abnormal weight loss Eyes:  Denies blurriness of vision Ears, nose, mouth, throat, and face: Denies mucositis or sore throat Respiratory: Denies cough, dyspnea or wheezes Cardiovascular: Denies palpitation, chest discomfort Gastrointestinal: Denies nausea, heartburn or change in bowel habits Skin: Denies abnormal skin rashes Lymphatics: Denies new lymphadenopathy or easy bruising Neurological: Denies numbness, tingling or new weaknesses Behavioral/Psych: Mood is stable, no new changes  Extremities: No lower extremity edema Breast: denies any pain or lumps or nodules in either breasts All other systems were reviewed with the patient and are negative.  I have reviewed the past medical history, past surgical history, social history and family history with the patient and they are unchanged from previous note.  ALLERGIES:  is allergic to doxycycline; erythromycin; minocin [minocycline hcl]; aspirin; lansoprazole; latex; nickel; and vitamin e.  MEDICATIONS:  Current Outpatient Medications  Medication Sig Dispense Refill  . anastrozole (ARIMIDEX) 1 MG tablet TAKE ONE TABLET BY MOUTH DAILY 90 tablet 2  . Ascorbic Acid (VITAMIN C) 500 MG tablet Take 500 mg by mouth daily.      . cholecalciferol (VITAMIN D) 1000 units tablet Take 1 tablet (1,000 Units total) by mouth daily.    . famotidine (PEPCID) 20 MG tablet Take 20 mg by mouth 2 (two) times daily.    . fluticasone (FLONASE) 50 MCG/ACT nasal spray Place 2 sprays into the nose daily. 48 g 3  . loratadine (CLARITIN) 10 MG tablet Take 10 mg by mouth daily.    . Multiple Vitamin (MULTIVITAMIN) tablet Take 1 tablet by mouth daily.    . naproxen sodium (ANAPROX) 220 MG tablet Take 440 mg by mouth 2 (two) times daily  as needed (pain).    Marland Kitchen scopolamine (TRANSDERM-SCOP, 1.5 MG,) 1 MG/3DAYS Place 1 patch (1.5 mg total) onto the skin every 3 (three) days. 2 patch 0  . sodium chloride (OCEAN) 0.65 % SOLN nasal spray Place 2 sprays into both nostrils daily.     Marland Kitchen  triamterene-hydrochlorothiazide (MAXZIDE-25) 37.5-25 MG tablet Take 1 tablet by mouth 3 (three) times a week.     . vitamin E 400 UNIT capsule Take 400 Units by mouth daily.       No current facility-administered medications for this visit.     PHYSICAL EXAMINATION: ECOG PERFORMANCE STATUS: 0 - Asymptomatic  Vitals:   05/05/19 0908  BP: 109/65  Pulse: 98  Resp: 17  Temp: 98.5 F (36.9 C)  SpO2: 100%   Filed Weights   05/05/19 0908  Weight: 113 lb (51.3 kg)    GENERAL: alert, no distress and comfortable SKIN: skin color, texture, turgor are normal, no rashes or significant lesions EYES: normal, Conjunctiva are pink and non-injected, sclera clear OROPHARYNX: no exudate, no erythema and lips, buccal mucosa, and tongue normal  NECK: supple, thyroid normal size, non-tender, without nodularity LYMPH: no palpable lymphadenopathy in the cervical, axillary or inguinal LUNGS: clear to auscultation and percussion with normal breathing effort HEART: regular rate & rhythm and no murmurs and no lower extremity edema ABDOMEN: abdomen soft, non-tender and normal bowel sounds MUSCULOSKELETAL: no cyanosis of digits and no clubbing  NEURO: alert & oriented x 3 with fluent speech, no focal motor/sensory deficits EXTREMITIES: No lower extremity edema  LABORATORY DATA:  I have reviewed the data as listed CMP Latest Ref Rng & Units 01/19/2019 01/01/2018 01/25/2017  Glucose 70 - 99 mg/dL 93 90 84  BUN 6 - 23 mg/dL '17 16 15  '$ Creatinine 0.40 - 1.20 mg/dL 0.78 0.80 0.70  Sodium 135 - 145 mEq/L 138 140 138  Potassium 3.5 - 5.1 mEq/L 3.9 4.3 4.0  Chloride 96 - 112 mEq/L 102 102 102  CO2 19 - 32 mEq/L '26 31 28  '$ Calcium 8.4 - 10.5 mg/dL 10.0 10.4 10.2  Total Protein 6.0 - 8.3 g/dL 7.7 7.8 7.3  Total Bilirubin 0.2 - 1.2 mg/dL 0.4 0.8 0.5  Alkaline Phos 39 - 117 U/L 90 96 91  AST 0 - 37 U/L '19 21 18  '$ ALT 0 - 35 U/L '15 20 13    '$ Lab Results  Component Value Date   WBC 6.7 01/19/2019   HGB 13.6  01/19/2019   HCT 42.2 01/19/2019   MCV 78.2 01/19/2019   PLT 237.0 01/19/2019   NEUTROABS 3.1 01/25/2017    ASSESSMENT & PLAN:  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 ([place]) Right mastectomy 02/02/2016: Invasive lobular cancer grade 1, 2.5 cm, involves nipple submucosa, LVI present, LCIS, margins negative, 0/1 lymph node, T2 N0 stage II a, ER 70%, PR 20%, HER-2 negative, Ki-67 5%   Oncotype DX score26, intermediate risk, 17% risk of recurrence with tamoxifen alone (Offered adjuvant chemo with TC: Patient refused)  Current Treatment: Anastrozole 1 mg daily started 03/08/16  Anastrozole Toxicities: Occasional hot flashes Slight tenosynovitis of the left wrist: Resolved with lemon peel soaked in olive oil  Breast cancer surveillance: 1.Breast exam 05/05/19: Benign 2.annual mammograms5/18/20: Benign, breast density category C 3. breast MRIs 03/02/19: No evidence of malignancy Plan to do breast MRIs every other year.   Return to clinic in 1 year for follow-up    No orders of the defined types were placed in this encounter.  The patient has a good understanding of the overall plan. she agrees with it. she will call with any problems that may develop before the next visit here.  Nicholas Lose, MD 05/05/2019  Julious Oka Dorshimer, am acting as scribe for Dr. Nicholas Lose.  I have reviewed the above documentation for accuracy and completeness, and I agree with the above.

## 2019-05-05 ENCOUNTER — Ambulatory Visit: Payer: Self-pay | Admitting: Hematology and Oncology

## 2019-05-05 ENCOUNTER — Other Ambulatory Visit: Payer: Self-pay

## 2019-05-05 ENCOUNTER — Inpatient Hospital Stay: Payer: Self-pay | Attending: Hematology and Oncology | Admitting: Hematology and Oncology

## 2019-05-05 DIAGNOSIS — C50111 Malignant neoplasm of central portion of right female breast: Secondary | ICD-10-CM | POA: Insufficient documentation

## 2019-05-05 DIAGNOSIS — Z170 Estrogen receptor positive status [ER+]: Secondary | ICD-10-CM | POA: Insufficient documentation

## 2019-05-05 DIAGNOSIS — Z9011 Acquired absence of right breast and nipple: Secondary | ICD-10-CM | POA: Insufficient documentation

## 2019-05-05 DIAGNOSIS — Z79899 Other long term (current) drug therapy: Secondary | ICD-10-CM | POA: Insufficient documentation

## 2019-05-05 DIAGNOSIS — R232 Flushing: Secondary | ICD-10-CM | POA: Insufficient documentation

## 2019-05-05 DIAGNOSIS — Z886 Allergy status to analgesic agent status: Secondary | ICD-10-CM | POA: Insufficient documentation

## 2019-05-05 DIAGNOSIS — Z881 Allergy status to other antibiotic agents status: Secondary | ICD-10-CM | POA: Insufficient documentation

## 2019-05-05 DIAGNOSIS — Z79811 Long term (current) use of aromatase inhibitors: Secondary | ICD-10-CM | POA: Insufficient documentation

## 2019-05-05 NOTE — Assessment & Plan Note (Signed)
Right mastectomy 02/02/2016: Invasive lobular cancer grade 1, 2.5 cm, involves nipple submucosa, LVI present, LCIS, margins negative, 0/1 lymph node, T2 N0 stage II a, ER 70%, PR 20%, HER-2 negative, Ki-67 5%   Oncotype DX score26, intermediate risk, 17% risk of recurrence with tamoxifen alone (Offered adjuvant chemo with TC: Patient refused)  Current Treatment: Anastrozole 1 mg daily started 03/08/16  Anastrozole Toxicities: Occasional hot flashes Slight tenosynovitis of the left wrist   Breast cancer surveillance: 1.Breast exam 05/05/19: Benign 2.annual mammograms5/18/20: Benign, breast density category C 3. breast MRIs 03/02/19: No evidence of malignancy Plan to do breast MRIs every other year  Return to clinic in 1 year for follow-up 

## 2019-05-08 ENCOUNTER — Encounter: Payer: Self-pay | Admitting: Internal Medicine

## 2019-05-08 DIAGNOSIS — H8109 Meniere's disease, unspecified ear: Secondary | ICD-10-CM

## 2019-05-18 ENCOUNTER — Encounter: Payer: Self-pay | Admitting: Internal Medicine

## 2019-07-10 ENCOUNTER — Other Ambulatory Visit: Payer: Self-pay | Admitting: Surgery

## 2019-07-10 DIAGNOSIS — Z1231 Encounter for screening mammogram for malignant neoplasm of breast: Secondary | ICD-10-CM

## 2019-08-25 ENCOUNTER — Encounter: Payer: Self-pay | Admitting: Internal Medicine

## 2019-08-25 DIAGNOSIS — E782 Mixed hyperlipidemia: Secondary | ICD-10-CM

## 2019-08-27 ENCOUNTER — Other Ambulatory Visit: Payer: Self-pay

## 2019-08-27 ENCOUNTER — Other Ambulatory Visit (INDEPENDENT_AMBULATORY_CARE_PROVIDER_SITE_OTHER): Payer: Self-pay

## 2019-08-27 DIAGNOSIS — E782 Mixed hyperlipidemia: Secondary | ICD-10-CM

## 2019-08-27 LAB — LIPID PANEL
Cholesterol: 260 mg/dL — ABNORMAL HIGH (ref 0–200)
HDL: 56 mg/dL (ref >39.00)
LDL Cholesterol: 175 mg/dL — ABNORMAL HIGH (ref 0–99)
NonHDL: 203.75
Total CHOL/HDL Ratio: 5
Triglycerides: 144 mg/dL (ref 0.0–149.0)
VLDL: 28.8 mg/dL (ref 0.0–40.0)

## 2019-08-27 LAB — VITAMIN D 25 HYDROXY (VIT D DEFICIENCY, FRACTURES): VITD: 48.65 ng/mL (ref 30.00–100.00)

## 2019-08-28 ENCOUNTER — Ambulatory Visit
Admission: RE | Admit: 2019-08-28 | Discharge: 2019-08-28 | Disposition: A | Discharge status: Home / Self Care | Payer: Self-pay | Source: Ambulatory Visit | Attending: Surgery | Admitting: Surgery

## 2019-08-28 DIAGNOSIS — Z1231 Encounter for screening mammogram for malignant neoplasm of breast: Secondary | ICD-10-CM

## 2019-09-21 ENCOUNTER — Encounter: Payer: Self-pay | Admitting: Internal Medicine

## 2019-09-21 DIAGNOSIS — M5416 Radiculopathy, lumbar region: Secondary | ICD-10-CM

## 2019-09-22 ENCOUNTER — Ambulatory Visit
Admission: RE | Admit: 2019-09-22 | Discharge: 2019-09-22 | Disposition: A | Discharge status: Home / Self Care | Payer: Self-pay | Source: Ambulatory Visit | Attending: Internal Medicine | Admitting: Internal Medicine

## 2019-09-22 ENCOUNTER — Other Ambulatory Visit: Payer: Self-pay

## 2019-09-22 ENCOUNTER — Ambulatory Visit
Admission: RE | Admit: 2019-09-22 | Discharge: 2019-09-22 | Disposition: A | Discharge status: Home / Self Care | Payer: No Typology Code available for payment source | Source: Ambulatory Visit | Attending: Internal Medicine | Admitting: Internal Medicine

## 2019-09-22 DIAGNOSIS — M5416 Radiculopathy, lumbar region: Secondary | ICD-10-CM

## 2019-09-22 NOTE — Addendum Note (Signed)
Addended by: Pricilla Holm A on: 09/22/2019 12:01 PM   Modules accepted: Orders

## 2019-09-23 ENCOUNTER — Other Ambulatory Visit: Payer: Self-pay

## 2019-09-25 ENCOUNTER — Encounter: Payer: Self-pay | Admitting: Internal Medicine

## 2019-10-02 ENCOUNTER — Other Ambulatory Visit: Payer: Self-pay

## 2019-10-02 ENCOUNTER — Ambulatory Visit: Payer: Self-pay | Admitting: Family Medicine

## 2019-10-02 ENCOUNTER — Encounter: Payer: Self-pay | Admitting: Family Medicine

## 2019-10-02 VITALS — BP 110/64 | HR 81 | Ht 67.0 in | Wt 112.0 lb

## 2019-10-02 DIAGNOSIS — M5417 Radiculopathy, lumbosacral region: Secondary | ICD-10-CM

## 2019-10-02 DIAGNOSIS — M7061 Trochanteric bursitis, right hip: Secondary | ICD-10-CM

## 2019-10-02 NOTE — Progress Notes (Signed)
Subjective:    I'm seeing this patient as a consultation for:  Dr. [name]. Note will be routed back to referring provider/PCP.  CC: Low back and hip pain  I, Molly Weber, LAT, ATC, am serving as scribe for Dr. Lynne Leader.  HPI: Pt is a 59 y/o female presenting w/ c/o low back pain and hip pain.  She locates her pain to lower back. Right sided lumbar radiculopathy which has gotten worse recently. Denies any groin pain. Occasionally has pain in greater trochanter. Feels her pain is following L4/L5 nerve root. She has already had an L-spine and pelvis MRI that showed bulging discs at L3-4 and L4-5 as well as B anterior-superior hip labral tears. Has tried some stretches. Uses Aleve prn. Has questions about labral tear. Does enjoy walking for exercise.  No weakness or numbness distally.  Pain located right lateral hip not at anterior hip.  No bowel bladder dysfunction.    Diagnostic testing: L-spine and pelvis MRI- 09/22/19  Past medical history, Surgical history, Family history, Social history, Allergies, and medications have been entered into the medical record, reviewed.   Review of Systems: No new headache, visual changes, nausea, vomiting, diarrhea, constipation, dizziness, abdominal pain, skin rash, fevers, chills, night sweats, weight loss, swollen lymph nodes, body aches, joint swelling, muscle aches, chest pain, shortness of breath, mood changes, visual or auditory hallucinations.   Objective:    Vitals:   10/02/19 1108  BP: 110/64  Pulse: 81  SpO2: 99%   General: Well Developed, well nourished, and in no acute distress.  Neuro/Psych: Alert and oriented x3, extra-ocular muscles intact, able to move all 4 extremities, sensation grossly intact. Skin: Warm and dry, no rashes noted.  Respiratory: Not using accessory muscles, speaking in full sentences, trachea midline.  Cardiovascular: Pulses palpable, no extremity edema. Abdomen: Does not appear distended. MSK:  L-spine  normal-appearing normal motion. Lower extremity strength intact except noted below. Reflexes and sensation are intact distally. Negative straight leg raise test.  Right hip normal-appearing normal motion.  Tender palpation right greater trochanter. Hip abduction strength diminished 4/5. External rotation strength diminished 4/5. Internal rotation and adduction strength intact.  Left hip normal-appearing normal motion nontender.  Hip abduction slightly weak 4+/5 as is hip external rotation.  Internal rotation adduction strength intact.   Lab and Radiology Results EXAM: MRI LUMBAR SPINE WITHOUT CONTRAST  TECHNIQUE: Multiplanar, multisequence MR imaging of the lumbar spine was performed. No intravenous contrast was administered.  COMPARISON:  CT abdomen/pelvis 09/28/2010  FINDINGS: Segmentation:  5 lumbar vertebrae.  Alignment: Straightening of the expected lumbar lordosis. No significant spondylolisthesis.  Vertebrae: Vertebral body height is maintained. No suspicious osseous lesion. Small Schmorl nodes at the L4-L5 level. Mild L4-L5 degenerative endplate marrow signal. 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  Conus medullaris and cauda equina: Conus extends to the L1-L2 level. No signal abnormality within the visualized distal spinal cord. Multilevel nerve root sheath cyst.  Paraspinal and other soft tissues: Incompletely assessed left renal cyst. Paraspinal soft tissues within normal limits.  Disc levels:  Moderate L4-L5 disc degeneration. No more than mild disc degeneration at the remaining lumbar levels.  T12-L1: No significant disc herniation or stenosis.  L1-L2: No significant disc herniation or stenosis.  L2-L3: No significant disc herniation or stenosis.  L3-L4: Small disc bulge. Minimal effacement of the ventral thecal sac without significant subarticular or central canal stenosis. No significant foraminal narrowing.  L4-L5: Disc bulge  asymmetric to the right. Superimposed broad-based right subarticular/foraminal disc protrusion.  Mild facet arthrosis/ligamentum flavum hypertrophy. Mild bilateral subarticular and central canal narrowing. Narrowing. Borderline mild right neural foraminal narrowing.  L5-S1: Mild facet arthrosis. No significant disc herniation or spinal canal stenosis.  IMPRESSION: Lumbar spondylosis as outlined and most notably as follows.  At L4-L5 there is moderate disc degeneration with mild degenerative endplate marrow edema. Disc bulge asymmetric to the right. Superimposed broad-based right subarticular/foraminal disc protrusion. Mild facet arthrosis/ligamentum flavum hypertrophy. Mild bilateral subarticular and central canal narrowing. Borderline mild right neural foraminal narrowing.  No significant spinal canal stenosis or neural foraminal narrowing at the remaining levels.   Electronically Signed   By: Kellie Simmering DO   On: 09/23/2019 11:18  EXAM: MRI PELVIS WITHOUT CONTRAST  TECHNIQUE: Multiplanar multisequence MR imaging of the pelvis was performed. No intravenous contrast was administered.  COMPARISON:  CT abdomen pelvis dated Sep 28, 2010.  FINDINGS: Bones: There is no evidence of acute fracture, dislocation or avascular necrosis. No focal bone lesion. The visualized sacroiliac joints and symphysis pubis appear normal. Degenerative disc disease at L3-L4 and L4-L5. 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  Articular cartilage and labrum  Articular cartilage: No focal chondral defect or subchondral signal abnormality identified.  Labrum: Bilateral anterior superior labral tears.  Joint or bursal effusion  Joint effusion: No significant hip joint effusion.  Bursae: No focal periarticular fluid collection.  Muscles and tendons  Muscles and tendons: The visualized gluteus, hamstring and iliopsoas tendons appear normal. No muscle edema or atrophy.  Other  findings  Miscellaneous: Prior hysterectomy. The visualized internal pelvic contents otherwise appear unremarkable.  IMPRESSION: 1. No acute abnormality of the pelvis. No evidence of metastatic disease. 2. Bilateral anterior superior labral tears.   Electronically Signed   By: Titus Dubin M.D.   On: 09/23/2019 13:44  I, Lynne Leader, personally (independently) visualized and performed the interpretation of the images attached in this note.   Impression and Recommendations:    Assessment and Plan: 59 y.o. female with  Lumbar radiculopathy right leg L5 dermatome.  This matches with findings seen on MRI.  Fortunately does not have weakness.  Symptoms are mildly obnoxious.  We discussed treatment options.  She is reluctant to consider medications or interventions at this time.  Discussed in detail gabapentin Lyrica prednisone and epidural steroid injection.  Happy to prescribe these order these at any time.  We will however proceed with trial of physical therapy.  Patient is self-pay so will not do a lot of therapy but a lot of home exercise would be helpful.  Lateral hip pain: Trochanteric bursitis/hip abductor tendinopathy.  No significant findings seen on recent MRI in the area of pain however I believe due to muscle dysfunction and weakness.  Discussed labrum tears as well seen on MRI.  She does not have any anterior hip pain or functional internal impingement signs or symptoms.  I do not think the labrum tears are a factor at all.  However did have discussion regarding this. Again plan to treat with physical therapy.  Recheck back as needed.  PDMP not reviewed this encounter. Orders Placed This Encounter  Procedures  . Ambulatory referral to Physical Therapy    Referral Priority:   Routine    Referral Type:   Physical Medicine    Referral Reason:   Specialty Services Required    Requested Specialty:   Physical Therapy   No orders of the defined types were placed in this  encounter.   Discussed warning signs or symptoms. Please see  discharge instructions. Patient expresses understanding.   The above documentation has been reviewed and is accurate and complete Lynne Leader   Total encounter time 45 minutes including charting time date of service.

## 2019-10-02 NOTE — Patient Instructions (Signed)
Thank you for coming in today. Try PT.  Try voltaren gel over the counter.   OK to try prednisone, gabapentin or lyrica in the future as needed.  Ok to do epidural steroid injection in the future if needed.    Epidural Steroid Injection  An epidural steroid injection is a shot of steroid medicine and numbing medicine that is given into the space between the spinal cord and the bones of the back (epidural space). The shot helps relieve pain caused by an irritated or swollen nerve root. The amount of pain relief you get from the injection depends on what is causing the nerve to be swollen and irritated, and how long your pain lasts. You are more likely to benefit from this injection if your pain is strong and comes on suddenly rather than if you have had long-term (chronic) pain. Tell a health care provider about:  Any allergies you have.  All medicines you are taking, including vitamins, herbs, eye drops, creams, and over-the-counter medicines.  Any problems you or family members have had with anesthetic medicines.  Any blood disorders you have.  Any surgeries you have had.  Any medical conditions you have.  Whether you are pregnant or may be pregnant. What are the risks? Generally, this is a safe procedure. However, problems may occur, including:  Headache.  Bleeding.  Infection.  Allergic reaction to medicines.  Nerve damage. What happens before the procedure? Staying hydrated Follow instructions from your health care provider about hydration, which may include:  Up to 2 hours before the procedure - you may continue to drink clear liquids, such as water, clear fruit juice, black coffee, and plain tea. Eating and drinking restrictions Follow instructions from your health care provider about eating and drinking, which may include:  8 hours before the procedure - stop eating heavy meals or foods, such as meat, fried foods, or fatty foods.  6 hours before the procedure -  stop eating light meals or foods, such as toast or cereal.  6 hours before the procedure - stop drinking milk or drinks that contain milk.  2 hours before the procedure - stop drinking clear liquids. Medicines  You may be given medicines to lower anxiety.  Ask your health care provider about: ? Changing or stopping your regular medicines. This is especially important if you are taking diabetes medicines or blood thinners. ? Taking medicines such as aspirin and ibuprofen. These medicines can thin your blood. Do not take these medicines unless your health care provider tells you to take them. ? Taking over-the-counter medicines, vitamins, herbs, and supplements.  Ask your health care provider what steps will be taken to prevent infection. General instructions  Plan to have someone take you home from the hospital or clinic.  If you will be going home right after the procedure, plan to have someone with you for 24 hours. What happens during the procedure?  An IV will be inserted into one of your veins.  You will be given one or more of the following: ? A medicine to help you relax (sedative). ? A medicine to numb the area (local anesthetic).  You will be asked to lie on your abdomen or sit.  The injection site will be cleaned.  A needle will be inserted through your skin into the epidural space. This may cause you some discomfort. An X-ray machine will be used to guide the needle as close as possible to the affected nerve.  A steroid medicine and a  local anesthetic will be injected into the epidural space.  The needle and IV will be removed.  A bandage (dressing) will be put over the injection site. The procedure may vary among health care providers and hospitals. What can I expect after the procedure? Follow these instructions at home: Injection site care  You may remove the bandage (dressing) after 24 hours.  Check your injection site every day for signs of infection.  Check for: ? Redness, swelling, or pain. ? Fluid or blood. ? Warmth. ? Pus or a bad smell. Managing pain, stiffness, and swelling  For 24 hours after the procedure: ? Avoid using heat on the injection site. ? Do not take baths, swim, or use a hot tub until your health care provider approves. Ask your health care provider if you may take a shower. You may only be allowed to take sponge baths.  If directed, put ice on the injection site. To do this: ? Put ice in a plastic bag. ? Place a towel between your skin and the bag. ? Leave the ice on for 20 minutes, 2-3 times a day.  Activity  Do not drive for 24 hours if you were given a sedative during your procedure.  Return to your normal activities as told by your health care provider. Ask your health care provider what activities are safe for you. General instructions  Your blood pressure, heart rate, breathing rate, and blood oxygen level will be monitored until you leave the hospital or clinic.  Your arm or leg may feel weak or numb for a few hours.  The injection site may feel sore.  Take over-the-counter and prescription medicines only as told by your health care provider.  Drink enough fluid to keep your urine pale yellow.  Keep all follow-up visits as told by your health care provider. This is important. Contact a health care provider if:  You have any of these signs of infection: ? Redness, swelling, or pain around your injection site. ? Fluid or blood coming from your injection site. ? Warmth coming from your injection site. ? Pus or a bad smell coming from your injection site. ? A fever.  You continue to have pain and soreness around the injection site, even after taking over-the-counter pain medicine.  You have severe, sudden, or lasting nausea or vomiting. Get help right away if:  You have severe pain at the injection site that is not relieved by medicines.  You develop a severe headache or a stiff neck.  You  become sensitive to light.  You have any new numbness or weakness in your legs or arms.  You lose control of your bladder or bowel movements.  You have trouble breathing. Summary  An epidural steroid injection is a shot of steroid medicine and numbing medicine that is given into the epidural space.  The shot helps relieve pain caused by an irritated or swollen nerve root.  You are more likely to benefit from this injection if your pain is strong and comes on suddenly rather than if you have had chronic pain. This information is not intended to replace advice given to you by your health care provider. Make sure you discuss any questions you have with your health care provider. Document Revised: 11/24/2018 Document Reviewed: 11/24/2018 Elsevier Patient Education  [place].

## 2019-10-09 ENCOUNTER — Ambulatory Visit: Payer: Self-pay | Attending: Family Medicine | Admitting: Physical Therapy

## 2019-10-09 ENCOUNTER — Encounter: Payer: Self-pay | Admitting: Physical Therapy

## 2019-10-09 ENCOUNTER — Other Ambulatory Visit: Payer: Self-pay

## 2019-10-09 DIAGNOSIS — M25551 Pain in right hip: Secondary | ICD-10-CM | POA: Insufficient documentation

## 2019-10-09 DIAGNOSIS — M545 Low back pain: Secondary | ICD-10-CM | POA: Insufficient documentation

## 2019-10-09 DIAGNOSIS — G8929 Other chronic pain: Secondary | ICD-10-CM | POA: Insufficient documentation

## 2019-10-09 DIAGNOSIS — M6281 Muscle weakness (generalized): Secondary | ICD-10-CM | POA: Insufficient documentation

## 2019-10-09 NOTE — Therapy (Signed)
[facility] [address] [place], [place], [address] Phone: [phone]   Fax:  [phone]  Physical Therapy Evaluation  Patient Details  Name: Cathy Woodward MRN: [id number] Date of Birth: 1960/12/22 Referring Provider (PT): Dr. Lynne Leader   Encounter Date: 10/09/2019  PT End of Session - 10/09/19 1215    Visit Number  1    Date for PT Re-Evaluation  11/20/19    Authorization Type  self pain    PT Start Time  0845    PT Stop Time  0930    PT Time Calculation (min)  45 min    Activity Tolerance  Patient tolerated treatment well;No increased pain    Behavior During Therapy  WFL for tasks assessed/performed       Past Medical History:  Diagnosis Date  . Abnormal EKG    septal Q waves  . Allergic rhinitis   . Brachial neuritis or radiculitis NOS   . 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 Chickenpox   . GERD (gastroesophageal reflux disease)   . Headache    migraines hx  . Heart murmur   . Hx of migraines   . Hyperlipidemia   . Leiomyoma of uterus, unspecified   . MVP (mitral valve prolapse)   . Palpitations    occasional  . Right-sided sensorineural hearing loss   . Scrofula    1 yr chest Xrays   . Shortness of breath dyspnea    d/t sinus issues  . Temporomandibular joint disorders, unspecified     Past Surgical History:  Procedure Laterality Date  . ABDOMINAL HYSTERECTOMY    . BREAST BIOPSY    . Excision of cervical lymph node anterior cervical chain    . MASTECTOMY Right 02/02/2016  . MOLE REMOVAL     buttock  . SIMPLE MASTECTOMY WITH AXILLARY SENTINEL NODE BIOPSY Right 02/02/2016   Procedure: RIGHT SIMPLE MASTECTOMY WITH AXILLARY SENTINEL NODE BIOPSY;  Surgeon: Erroll Luna, MD;  Location: [facility];  Service: General;  Laterality: Right;    There were no vitals filed for this visit.   Subjective Assessment - 10/09/19 0852    Subjective  Patient had had chronic  lumbar radiculopathy and hip bursitis. Patient was vacuuming her house and may of made her pain worse. Patient has had right SI joint issues. Pain went down the L4-L5  nerve pathway into the right leg. Most of pain in the right buttocks to the greater trochanter.    Patient Stated Goals  know exercises to keep pain in control, understand how to manage pain    Currently in Pain?  Yes    Pain Score  4    high 9/10   Pain Location  Back    Pain Orientation  Right;Lower    Pain Descriptors / Indicators  Aching    Pain Type  Chronic pain    Pain Onset  More than a month ago    Pain Frequency  Intermittent    Aggravating Factors   crossing right leg over right, walking,    Pain Relieving Factors  exercises    Multiple Pain Sites  Yes    Pain Score  7    Pain Location  Hip    Pain Orientation  Right    Pain Descriptors / Indicators  Aching    Pain Type  Chronic pain    Pain Onset  More than a month ago    Pain Frequency  Constant    Aggravating Factors   crossing the right leg over left, laying on the right side, walking    Pain Relieving Factors  alleve,         [facility] PT Assessment - 10/09/19 0001      Assessment   Medical Diagnosis  M70.01 Trochanteric bursitis of right hip ;M5417 Lumbosacral radiculopathy at L5     Referring Provider (PT)  Dr. Lynne Leader    Onset Date/Surgical Date  --   chronic   Prior Therapy  yes      Precautions   Precautions  Other (comment)    Precaution Comments  breast cancer with radiation      Restrictions   Weight Bearing Restrictions  No      Balance Screen   Has the patient fallen in the past 6 months  No    Has the patient had a decrease in activity level because of a fear of falling?   No    Is the patient reluctant to leave their home because of a fear of falling?   No      Prior Function   Level of Independence  Independent    Vocation  Full time employment    Vocation Requirements  physician       Cognition   Overall Cognitive  Status  Within Functional Limits for tasks assessed      Observation/Other Assessments   Focus on Therapeutic Outcomes (FOTO)   41% limitation; goal is 31% goal       Posture/Postural Control   Posture/Postural Control  Postural limitations    Postural Limitations  Rounded Shoulders;Forward head;Flexed trunk    Posture Comments  hips shifted to right, flat lumbar       ROM / Strength   AROM / PROM / Strength  AROM;PROM;Strength      AROM   Lumbar Flexion  decreased by 25%, deviate to the right    Lumbar Extension  decreased by 50% with pain    Lumbar - Right Side Bend  decreased by 25%    Lumbar - Left Side Bend  decreased by 25%    Lumbar - Right Rotation  decreased by 25%    Lumbar - Left Rotation  decreased by 25%      PROM   Right Hip External Rotation   35    Left Hip External Rotation   40      Strength   Right Hip External Rotation   4/5    Right Hip Internal Rotation  4/5    Right Hip ABduction  4+/5    Left Hip Flexion  4/5    Left Hip Extension  4-/5    Left Hip Internal Rotation  4/5    Left Hip ABduction  3+/5      Palpation   SI assessment   right ilium anteriorly rotated; right ischial tuberosity is higher than the left     Palpation comment  tightness in lumbar paraspinals, right gluteals      Special Tests    Special Tests  Sacrolliac Tests      Pelvic Dictraction   Findings  Positive    Side   Right    Comment  some pain                  Objective measurements completed on examination: See above findings.      [facility] Adult PT Treatment/Exercise - 10/09/19 0001      Modalities  Modalities  Iontophoresis      Iontophoresis   Type of Iontophoresis  Dexamethasone    Location  right hip     Dose  1 ml, #1    Time  4 hour patch             PT Education - 10/09/19 1214    Education Details  information on dry needling and iontophoresis    Person(s) Educated  Patient    Methods  Explanation;Handout    Comprehension   Verbalized understanding       PT Short Term Goals - 10/09/19 1222      PT SHORT TERM GOAL #1   Title  independent with intial HEP    Time  4    Period  Weeks    Status  New    Target Date  11/06/19      PT SHORT TERM GOAL #2   Title  understand correct body mechanics with daily and work tasks to reduce strain on her lumbar    Time  4    Period  Weeks    Status  New    Target Date  11/06/19        PT Long Term Goals - 10/09/19 1223      PT LONG TERM GOAL #1   Title  independent with advanced HEP    Time  6    Period  Weeks    Status  New    Target Date  11/20/19      PT LONG TERM GOAL #2   Title  able to walk from the end of the parking lot to walk to work instead of closer due to pain decresaed to a manageable level and increased in hip strength    Time  6    Period  Weeks    Status  New    Target Date  11/20/19      PT LONG TERM GOAL #3   Title  understand how to manage her pain with understanding the pain model, using meditation, and correcting her posture    Time  6    Period  Weeks    Status  New    Target Date  11/20/19      PT LONG TERM GOAL #4   Title  FOTO score </= 31% limitation    Time  6    Period  Weeks    Status  New    Target Date  11/20/19             Plan - 10/09/19 1215    Clinical Impression Statement  Patient is a 59 year old female with chronic back and right hip pain. Patient reports her last flare-up started end of April when she vacuumed. Patient reports he pain 4/10 in the ack and 7/10 in the right hip when she crosses her right leg over the left, walking and standing. Patient stands with a posterior tilted pelvis, pelvis shifted to the right, reduce lumbar lordosis, and sits in a flexed posture. Lumbar ROM decreased by 25% and extension decreased by 50%. P/ROM of right hip external rotation is 35 degrees and left is 40 degrees. Bilateral hip strength is 4/5. Right ilium is anteriorly rotated and right ischical tuberosity is  higher than the left. Patient has tightness in the lumbar paraspinals. Palpable pain in the lumbar and right gluteal and right greater trochanter. Positive distraction test to the right SI joint. Patient would benefit from skilled therapy to  reduce her pain, understand how to manage her pain and increased core and hip strength.    Personal Factors and Comorbidities  Comorbidity 1;Fitness;Profession    Comorbidities  breast cancer    Examination-Activity Limitations  Sit;Locomotion Level;Stand    Stability/Clinical Decision Making  Evolving/Moderate complexity    Clinical Decision Making  Low    Rehab Potential  Excellent    PT Frequency  1x / week    PT Duration  6 weeks    PT Treatment/Interventions  Cryotherapy;Electrical Stimulation;Iontophoresis 4mg /ml Dexamethasone;Moist Heat;Traction;Neuromuscular re-education;Therapeutic exercise;Therapeutic activities;Patient/family education;Manual techniques;Dry needling;Spinal Manipulations    PT Next Visit Plan  dry needling to lumbar and right gluteal, see if ionto helped, core and hip strength, body mechanics with daily tasks, back extensor strength, correct pelvis    Consulted and Agree with Plan of Care  Patient       Patient will benefit from skilled therapeutic intervention in order to improve the following deficits and impairments:  Decreased range of motion, Increased fascial restricitons, Pain, Decreased activity tolerance, Decreased endurance, Decreased strength  Visit Diagnosis: Muscle weakness (generalized) - Plan: PT plan of care cert/re-cert  Chronic right-sided low back pain without sciatica - Plan: PT plan of care cert/re-cert  Pain in right hip - Plan: PT plan of care cert/re-cert     Problem List Patient Active Problem List   Diagnosis Date Noted  . Breast cancer, stage 2 ([facility]) 02/02/2016  . 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 Hyperlipidemia 05/17/2015  . Routine health maintenance  02/03/2012  . Degeneration of cervical intervertebral disc 09/01/2007  . Allergic rhinitis 07/22/2007  . GERD 07/22/2007  . Mitral valve disorder 05/14/2007    Earlie Counts, PT 10/09/19 12:29 PM   [facility] [address], [place], [place], [address] Phone: [phone]   Fax:  [phone]  Name: Cathy Woodward MRN: [id number] Date of Birth: [birth year]

## 2019-10-09 NOTE — Patient Instructions (Signed)
Trigger Point Dry Needling  . What is Trigger Point Dry Needling (DN)? o DN is a physical therapy technique used to treat muscle pain and dysfunction. Specifically, DN helps deactivate muscle trigger points (muscle knots).  o A thin filiform needle is used to penetrate the skin and stimulate the underlying trigger point. The goal is for a local twitch response (LTR) to occur and for the trigger point to relax. No medication of any kind is injected during the procedure.   . What Does Trigger Point Dry Needling Feel Like?  o The procedure feels different for each individual patient. Some patients report that they do not actually feel the needle enter the skin and overall the process is not painful. Very mild bleeding may occur. However, many patients feel a deep cramping in the muscle in which the needle was inserted. This is the local twitch response.   Marland Kitchen How Will I feel after the treatment? o Soreness is normal, and the onset of soreness may not occur for a few hours. Typically this soreness does not last longer than two days.  o Bruising is uncommon, however; ice can be used to decrease any possible bruising.  o In rare cases feeling tired or nauseous after the treatment is normal. In addition, your symptoms may get worse before they get better, this period will typically not last longer than 24 hours.   . What Can I do After My Treatment? o Increase your hydration by drinking more water for the next 24 hours. o You may place ice or heat on the areas treated that have become sore, however, do not use heat on inflamed or bruised areas. Heat often brings more relief post needling. o You can continue your regular activities, but vigorous activity is not recommended initially after the treatment for 24 hours. o DN is best combined with other physical therapy such as strengthening, stretching, and other therapies.    [facility] [address], [place], [place]  [address] Phone # [phone] Fax [phone]   IONTOPHORESIS PATIENT PRECAUTIONS & CONTRAINDICATIONS:  . Redness under one or both electrodes can occur.  This characterized by a uniform redness that usually disappears within 12 hours of treatment. . Small pinhead size blisters may result in response to the drug.  Contact your physician if the problem persists more than 24 hours. . On rare occasions, iontophoresis therapy can result in temporary skin reactions such as rash, inflammation, irritation or burns.  The skin reactions may be the result of individual sensitivity to the ionic solution used, the condition of the skin at the start of treatment, reaction to the materials in the electrodes, allergies or sensitivity to dexamethasone, or a poor connection between the patch and your skin.  Discontinue using iontophoresis if you have any of these reactions and report to your therapist. . Remove the Patch or electrodes if you have any undue sensation of pain or burning during the treatment and report discomfort to your therapist. . Tell your Therapist if you have had known adverse reactions to the application of electrical current. . If using the Patch, the LED light will turn off when treatment is complete and the patch can be removed.  Approximate treatment time is 1-3 hours.  Remove the patch when light goes off or after 6 hours. . The Patch can be worn during normal activity, however excessive motion where the electrodes have been placed can cause poor contact between the skin and the electrode or uneven electrical  current resulting in greater risk of skin irritation. Marland Kitchen Keep out of the reach of children.   . DO NOT use if you have a cardiac pacemaker or any other electrically sensitive implanted device. . DO NOT use if you have a known sensitivity to dexamethasone. . DO NOT use during Magnetic Resonance Imaging (MRI). . DO NOT use over broken or compromised skin (e.g. sunburn, cuts, or acne)  due to the increased risk of skin reaction. . DO NOT SHAVE over the area to be treated:  To establish good contact between the Patch and the skin, excessive hair may be clipped. . DO NOT place the Patch or electrodes on or over your eyes, directly over your heart, or brain. . DO NOT reuse the Patch or electrodes as this may cause burns to occur.

## 2019-10-16 ENCOUNTER — Encounter: Payer: Self-pay | Admitting: Physical Therapy

## 2019-10-16 ENCOUNTER — Other Ambulatory Visit: Payer: Self-pay

## 2019-10-16 ENCOUNTER — Ambulatory Visit: Payer: Self-pay | Admitting: Physical Therapy

## 2019-10-16 DIAGNOSIS — M545 Low back pain, unspecified: Secondary | ICD-10-CM

## 2019-10-16 DIAGNOSIS — M25551 Pain in right hip: Secondary | ICD-10-CM

## 2019-10-16 DIAGNOSIS — M6281 Muscle weakness (generalized): Secondary | ICD-10-CM

## 2019-10-16 NOTE — Therapy (Signed)
[facility] [address], [place], [place], [address] Phone: [phone]   Fax:  [phone]  Physical Therapy Treatment  Patient Details  Name: Cathy Woodward MRN: [id number] Date of Birth: 11/06/1960 Referring Provider (PT): Dr. Lynne Leader   Encounter Date: 10/16/2019  PT End of Session - 10/16/19 0937    Visit Number  2    Date for PT Re-Evaluation  11/20/19    Authorization Type  self pain    PT Start Time  0930    PT Stop Time  1010    PT Time Calculation (min)  40 min    Activity Tolerance  Patient tolerated treatment well;No increased pain    Behavior During Therapy  WFL for tasks assessed/performed       Past Medical History:  Diagnosis Date  . Abnormal EKG    septal Q waves  . Allergic rhinitis   . Brachial neuritis or radiculitis NOS   . 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 Chickenpox   . GERD (gastroesophageal reflux disease)   . Headache    migraines hx  . Heart murmur   . Hx of migraines   . Hyperlipidemia   . Leiomyoma of uterus, unspecified   . MVP (mitral valve prolapse)   . Palpitations    occasional  . Right-sided sensorineural hearing loss   . Scrofula    1 yr chest Xrays   . Shortness of breath dyspnea    d/t sinus issues  . Temporomandibular joint disorders, unspecified     Past Surgical History:  Procedure Laterality Date  . ABDOMINAL HYSTERECTOMY    . BREAST BIOPSY    . Excision of cervical lymph node anterior cervical chain    . MASTECTOMY Right 02/02/2016  . MOLE REMOVAL     buttock  . SIMPLE MASTECTOMY WITH AXILLARY SENTINEL NODE BIOPSY Right 02/02/2016   Procedure: RIGHT SIMPLE MASTECTOMY WITH AXILLARY SENTINEL NODE BIOPSY;  Surgeon: Erroll Luna, MD;  Location: [facility];  Service: General;  Laterality: Right;    There were no vitals filed for this visit.  Subjective Assessment - 10/16/19 0933    Subjective  My back feel better. I have a  little pain in the SI joint. The pain in the right torchanteric area felt better with patch. I have been applying lemon oil to the right hip and it helps. When I do alot of walking triggers the pain.    Patient Stated Goals  know exercises to keep pain in control, understand how to manage pain    Currently in Pain?  Yes    Pain Score  2     Pain Location  [place]    Pain Orientation  Right    Pain Descriptors / Indicators  Aching    Pain Type  Chronic pain    Pain Onset  More than a month ago    Pain Frequency  Constant    Aggravating Factors   walking, crossing right leg over left    Pain Relieving Factors  exercise    Multiple Pain Sites  No         [facility] PT Assessment - 10/16/19 0001      Palpation   SI assessment   right ilium anteriorly rotated; right ischial tuberosity is higher than the left                     [facility] Adult PT Treatment/Exercise -  10/16/19 0001      Self-Care   Self-Care  Other Self-Care Comments    Other Self-Care Comments   sitting on tennis ball to massage the right gluteal and piriformis      Lumbar Exercises: Stretches   ITB Stretch  Right;1 rep;30 seconds    ITB Stretch Limitations  standing    Piriformis Stretch  Right;2 reps;30 seconds    Piriformis Stretch Limitations  sitting      Modalities   Modalities  Iontophoresis      Iontophoresis   Type of Iontophoresis  Dexamethasone    Location  right hip     Dose  1 ml, #2    Time  4 hour patch      Manual Therapy   Manual Therapy  Soft tissue mobilization;Joint mobilization    Joint Mobilization  gapping of the right lumbar facets    Soft tissue mobilization  right quadratus, right lumbar paraspinals, right iliopsoas, right gluteal and piriformis       Trigger Point Dry Needling - 10/16/19 0001    Consent Given?  Yes    Education Handout Provided  Yes    Muscles Treated Back/Hip  Lumbar multifidi;Quadratus lumborum;Gluteus maximus;Piriformis   right   Gluteus Maximus  Response  Twitch response elicited;Palpable increased muscle length    Piriformis Response  Twitch response elicited;Palpable increased muscle length    Lumbar multifidi Response  Twitch response elicited;Palpable increased muscle length    Quadratus Lumborum Response  Twitch response elicited;Palpable increased muscle length           PT Education - 10/16/19 1012    Education Details  Access Code: [employer]    Person(s) Educated  Patient    Methods  Explanation;Demonstration;Verbal cues;Handout    Comprehension  Verbalized understanding;Returned demonstration       PT Short Term Goals - 10/09/19 1222      PT SHORT TERM GOAL #1   Title  independent with intial HEP    Time  4    Period  Weeks    Status  New    Target Date  11/06/19      PT SHORT TERM GOAL #2   Title  understand correct body mechanics with daily and work tasks to reduce strain on her lumbar    Time  4    Period  Weeks    Status  New    Target Date  11/06/19        PT Long Term Goals - 10/09/19 1223      PT LONG TERM GOAL #1   Title  independent with advanced HEP    Time  6    Period  Weeks    Status  New    Target Date  11/20/19      PT LONG TERM GOAL #2   Title  able to walk from the end of the parking lot to walk to work instead of closer due to pain decresaed to a manageable level and increased in hip strength    Time  6    Period  Weeks    Status  New    Target Date  11/20/19      PT LONG TERM GOAL #3   Title  understand how to manage her pain with understanding the pain model, using meditation, and correcting her posture    Time  6    Period  Weeks    Status  New    Target Date  11/20/19  PT LONG TERM GOAL #4   Title  FOTO score </= 31% limitation    Time  6    Period  Weeks    Status  New    Target Date  11/20/19            Plan - 10/16/19 1013    Clinical Impression Statement  Patient is having no low back pain today. She is having right SI joint pain. Patient had  trigger points in the right quadratus, gluteal, and piriformis. Patient learned how to stretch the area. Patient right iliun is rotated. Patient has just started therapy therefore has not met goals.    Personal Factors and Comorbidities  Comorbidity 1;Fitness;Profession    Comorbidities  breast cancer    Examination-Activity Limitations  Sit;Locomotion Level;Stand    Stability/Clinical Decision Making  Evolving/Moderate complexity    Rehab Potential  Excellent    PT Frequency  1x / week    PT Duration  6 weeks    PT Treatment/Interventions  Cryotherapy;Electrical Stimulation;Iontophoresis 33m/ml Dexamethasone;Moist Heat;Traction;Neuromuscular re-education;Therapeutic exercise;Therapeutic activities;Patient/family education;Manual techniques;Dry needling;Spinal Manipulations    PT Next Visit Plan  assess the dry needling, joint mobilization to right hip, nustep, ionto #3, back extensor strength    PT Home Exercise Plan  Access Code: [email or web address]   Consulted and Agree with Plan of Care  Patient       Patient will benefit from skilled therapeutic intervention in order to improve the following deficits and impairments:  Decreased range of motion, Increased fascial restricitons, Pain, Decreased activity tolerance, Decreased endurance, Decreased strength  Visit Diagnosis: Muscle weakness (generalized)  Chronic right-sided low back pain without sciatica  Pain in right hip     Problem List Patient Active Problem List   Diagnosis Date Noted  . [place], [place] 2 [place] 02/02/2016  . 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 ([place] 12/29/2015  . Hyperlipidemia 05/17/2015  . Routine health maintenance 02/03/2012  . Degeneration of cervical intervertebral disc 09/01/2007  . Allergic rhinitis 07/22/2007  . GERD 07/22/2007  . Mitral valve disorder 05/14/2007    CEarlie Counts PT 10/16/19 11:34 AM   [facility] [address]  [place] [address]: [phone]  Fax:  [phone] Name: Cathy KUHNERTMRN: [id number] of Birth: [birth year]

## 2019-10-16 NOTE — Patient Instructions (Signed)
Access Code: [email or web address] URL: [email or web address] Date: 10/16/2019 Prepared by: Earlie Counts  Exercises Piriformis Mobilization with Wyvonnia Lora - 1 x daily - 7 x weekly - 1 sets - 10 reps Seated Piriformis Stretch with Trunk Bend - 1 x daily - 7 x weekly - 1 sets - 2 reps - 30 sec hold Standing ITB Stretch - 1 x daily - 7 x weekly - 1 sets - 2 reps - 30 sec hold [facility] [address] [place], [place] [address] Phone # [phone] Fax [phone]

## 2019-11-06 ENCOUNTER — Ambulatory Visit: Payer: Self-pay | Attending: Family Medicine | Admitting: Physical Therapy

## 2019-11-06 ENCOUNTER — Other Ambulatory Visit: Payer: Self-pay

## 2019-11-06 ENCOUNTER — Encounter: Payer: Self-pay | Admitting: Physical Therapy

## 2019-11-06 DIAGNOSIS — M6281 Muscle weakness (generalized): Secondary | ICD-10-CM | POA: Insufficient documentation

## 2019-11-06 DIAGNOSIS — G8929 Other chronic pain: Secondary | ICD-10-CM | POA: Insufficient documentation

## 2019-11-06 DIAGNOSIS — M545 Low back pain, unspecified: Secondary | ICD-10-CM

## 2019-11-06 DIAGNOSIS — M25551 Pain in right hip: Secondary | ICD-10-CM | POA: Insufficient documentation

## 2019-11-06 NOTE — Therapy (Signed)
[facility] [address], [place], [place], [address] Phone: [phone]   Fax:  [phone]  Physical Therapy Treatment  Patient Details  Name: Cathy Woodward MRN: [id number] Date of Birth: 1960/12/31 Referring Provider (PT): Dr. Lynne Leader   Encounter Date: 11/06/2019   PT End of Session - 11/06/19 0844    Visit Number 3    Date for PT Re-Evaluation 11/20/19    PT Start Time 0835    PT Stop Time 0930    PT Time Calculation (min) 55 min    Activity Tolerance Patient tolerated treatment well    Behavior During Therapy [facility] for tasks assessed/performed           Past Medical History:  Diagnosis Date  . Abnormal EKG    septal Q waves  . Allergic rhinitis   . Brachial neuritis or radiculitis NOS   . 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 Chickenpox   . GERD (gastroesophageal reflux disease)   . Headache    migraines hx  . Heart murmur   . Hx of migraines   . Hyperlipidemia   . Leiomyoma of uterus, unspecified   . MVP (mitral valve prolapse)   . Palpitations    occasional  . Right-sided sensorineural hearing loss   . Scrofula    1 yr chest Xrays   . Shortness of breath dyspnea    d/t sinus issues  . Temporomandibular joint disorders, unspecified     Past Surgical History:  Procedure Laterality Date  . ABDOMINAL HYSTERECTOMY    . BREAST BIOPSY    . Excision of cervical lymph node anterior cervical chain    . MASTECTOMY Right 02/02/2016  . MOLE REMOVAL     buttock  . SIMPLE MASTECTOMY WITH AXILLARY SENTINEL NODE BIOPSY Right 02/02/2016   Procedure: RIGHT SIMPLE MASTECTOMY WITH AXILLARY SENTINEL NODE BIOPSY;  Surgeon: Erroll Luna, MD;  Location: [facility];  Service: General;  Laterality: Right;    There were no vitals filed for this visit.   Subjective Assessment - 11/06/19 0840    Subjective Pain is RT SI then laterally into hip. Dry needling was most helpful. I am not very  dilligent in my exercises.    Currently in Pain? Yes    Pain Score 6     Pain Location Hip    Pain Orientation Right    Pain Descriptors / Indicators Sore    Aggravating Factors  Prolonged walking, sometimes I just don't know why    Pain Relieving Factors Dry needling helps                             [facility] Adult PT Treatment/Exercise - 11/06/19 0001      Lumbar Exercises: Stretches   Piriformis Stretch Right;3 reps;30 seconds    Piriformis Stretch Limitations supine      Knee/Hip Exercises: Stretches   ITB Stretch Right;2 reps;10 seconds   In supine with strap     Knee/Hip Exercises: Aerobic   Nustep L1 x 5 min warm up with discussion on status      Knee/Hip Exercises: Supine   Bridges --   2x5, VC to contract buttocks   Other Supine Knee/Hip Exercises Hip stirs 10x each direction with yoga strap      Iontophoresis   Time Declined todau      Manual Therapy   Manual Therapy Soft tissue mobilization;Joint  mobilization    Manual therapy comments MET for RT anterior ilium    Soft tissue mobilization RT QL, lumbar paraspinals, Addaday assit to Rt gluteals in sidelying                    PT Short Term Goals - 10/09/19 1222      PT SHORT TERM GOAL #1   Title independent with intial HEP    Time 4    Period Weeks    Status New    Target Date 11/06/19      PT SHORT TERM GOAL #2   Title understand correct body mechanics with daily and work tasks to reduce strain on her lumbar    Time 4    Period Weeks    Status New    Target Date 11/06/19             PT Long Term Goals - 10/09/19 1223      PT LONG TERM GOAL #1   Title independent with advanced HEP    Time 6    Period Weeks    Status New    Target Date 11/20/19      PT LONG TERM GOAL #2   Title able to walk from the end of the parking lot to walk to work instead of closer due to pain decresaed to a manageable level and increased in hip strength    Time 6    Period Weeks    Status  New    Target Date 11/20/19      PT LONG TERM GOAL #3   Title understand how to manage her pain with understanding the pain model, using meditation, and correcting her posture    Time 6    Period Weeks    Status New    Target Date 11/20/19      PT LONG TERM GOAL #4   Title FOTO score </= 31% limitation    Time 6    Period Weeks    Status New    Target Date 11/20/19                 Plan - 11/06/19 0845    Clinical Impression Statement Pt arriives with moderate pain, this pain lessened at end of treatment. Pt continues with anterior RT ilium rotation that was unchanged during session. Pt demonstrates hip weakness during bridge exercise possibly Lt > RT ( no pain LT). Soft tissue work seemed to help RT QL.    Personal Factors and Comorbidities Comorbidity 1;Fitness;Profession    Comorbidities breast cancer    Examination-Activity Limitations Sit;Locomotion Level;Stand    Stability/Clinical Decision Making Evolving/Moderate complexity    Rehab Potential Excellent    PT Frequency 1x / week    PT Duration 6 weeks    PT Treatment/Interventions Cryotherapy;Electrical Stimulation;Iontophoresis '4mg'$ /ml Dexamethasone;Moist Heat;Traction;Neuromuscular re-education;Therapeutic exercise;Therapeutic activities;Patient/family education;Manual techniques;Dry needling;Spinal Manipulations    PT Next Visit Plan RT hip external rotation, dry needling if seen by PT, proceed with core and back strength    PT Home Exercise Plan Access Code: [email or web address]    Consulted and Agree with Plan of Care Patient           Patient will benefit from skilled therapeutic intervention in order to improve the following deficits and impairments:  Decreased range of motion, Increased fascial restricitons, Pain, Decreased activity tolerance, Decreased endurance, Decreased strength  Visit Diagnosis: Muscle weakness (generalized)  Chronic right-sided low back pain without sciatica  Pain in right  hip     Problem List Patient Active Problem List   Diagnosis Date Noted  . Breast cancer, stage 2 ([place]) 02/02/2016  . 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 ([place]) 12/29/2015  . Hyperlipidemia 05/17/2015  . Routine health maintenance 02/03/2012  . Degeneration of cervical intervertebral disc 09/01/2007  . Allergic rhinitis 07/22/2007  . GERD 07/22/2007  . Mitral valve disorder 05/14/2007    Faylene Allerton, PTA 11/06/2019, 5:32 PM  [facility] [address], [place], [place], [address] Phone: [phone]   Fax:  [phone]  Name: FERNANDO TORRY MRN: [id number] Date of Birth: 06-Jan-1961

## 2019-11-13 ENCOUNTER — Other Ambulatory Visit: Payer: Self-pay

## 2019-11-13 ENCOUNTER — Ambulatory Visit: Payer: Self-pay | Admitting: Physical Therapy

## 2019-11-13 ENCOUNTER — Encounter: Payer: Self-pay | Admitting: Physical Therapy

## 2019-11-13 DIAGNOSIS — M545 Low back pain, unspecified: Secondary | ICD-10-CM

## 2019-11-13 DIAGNOSIS — M6281 Muscle weakness (generalized): Secondary | ICD-10-CM

## 2019-11-13 DIAGNOSIS — M25551 Pain in right hip: Secondary | ICD-10-CM

## 2019-11-13 NOTE — Therapy (Signed)
[facility] [address] [place], [place], [address] Phone: [phone]   Fax:  [phone]  Physical Therapy Treatment  Patient Details  Name: Cathy Woodward MRN: [id number] Date of Birth: [birth year] Referring Provider (PT): Dr. 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, right 12/29/2015   Chickenpox    GERD (gastroesophageal reflux disease)    Headache    migraines hx   Heart murmur    Hx of migraines    Hyperlipidemia    Leiomyoma of uterus, unspecified    MVP (mitral valve prolapse)    Palpitations    occasional   Right-sided sensorineural hearing loss    Scrofula    1 yr chest Xrays    Shortness of breath dyspnea    d/t sinus issues   Temporomandibular joint disorders, unspecified     Past Surgical History:  Procedure Laterality Date   ABDOMINAL HYSTERECTOMY     BREAST BIOPSY     Excision of cervical lymph node anterior cervical chain     MASTECTOMY Right 02/02/2016   MOLE REMOVAL     buttock   SIMPLE MASTECTOMY WITH AXILLARY SENTINEL NODE BIOPSY Right 02/02/2016   Procedure: RIGHT SIMPLE MASTECTOMY WITH AXILLARY SENTINEL NODE BIOPSY;  Surgeon: Erroll Luna, MD;  Location: [facility];  Service: General;  Laterality: Right;    There were no vitals filed for this visit.   Subjective Assessment - 11/13/19 1103    Subjective I feel the iontophoresis is not  helping. The dry needling has helped.  Bursitis pain is 60% better. Radicular symptoms has no change.    Patient Stated Goals know exercises to keep pain in control, understand how to manage pain    Currently in Pain? Yes    Pain Score 5     Pain Location Hip    Pain Orientation Right    Pain Descriptors / Indicators Sore    Pain Type Chronic pain    Pain Onset More than a month ago    Aggravating Factors  prolonged walking, sometimes I just do not know why    Pain Relieving Factors dry needling    Multiple Pain Sites No                             [facility] Adult PT Treatment/Exercise - 11/13/19 0001      Knee/Hip Exercises: Aerobic   Nustep L 3 x 5 min warm up with discussion on status      Knee/Hip Exercises: Sidelying   Clams 20 with VC to go slow and control      Knee/Hip Exercises: Prone   Other Prone Exercises alternate shoulder and hip extension 10x each way while monitoring for pain and control  Manual Therapy   Manual Therapy Soft tissue mobilization    Manual therapy comments while assessing trigger points for dry needling    Soft tissue mobilization lumbar paraspinals and right gluteals            Trigger Point Dry Needling - 11/13/19 0001    Consent Given? Yes    Education Handout Provided Previously provided    Muscles Treated Back/Hip Piriformis;Gluteus medius;Gluteus maximus;Lumbar multifidi    Gluteus Medius Response Twitch response elicited;Palpable increased muscle length   right   Gluteus Maximus Response Twitch response elicited;Palpable increased muscle length    Piriformis Response Twitch response elicited;Palpable increased muscle length   right   Lumbar multifidi Response Twitch response elicited;Palpable increased muscle length   bil.    Quadratus Lumborum Response Twitch response elicited;Palpable increased muscle length   right               PT Education - 11/13/19 1138    Education Details Access Code: [email or web address]     Person(s) Educated Patient    Methods Explanation;Demonstration;Verbal cues;Handout    Comprehension Returned demonstration;Verbalized understanding            PT Short Term Goals - 11/13/19 1144      PT SHORT TERM GOAL #1   Title independent with intial HEP    Time 4    Period Weeks    Status Achieved      PT SHORT TERM GOAL #2   Title understand correct body mechanics with daily and work tasks to reduce strain on her lumbar    Time 4    Period Weeks    Status Achieved             PT Long Term Goals - 10/09/19 1223      PT LONG TERM GOAL #1   Title independent with advanced HEP    Time 6    Period Weeks    Status New    Target Date 11/20/19      PT LONG TERM GOAL #2   Title able to walk from the end of the parking lot to walk to work instead of closer due to pain decresaed to a manageable level and increased in hip strength    Time 6    Period Weeks    Status New    Target Date 11/20/19      PT LONG TERM GOAL #3   Title understand how to manage her pain with understanding the pain model, using meditation, and correcting her posture    Time 6    Period Weeks    Status New    Target Date 11/20/19      PT LONG TERM GOAL #4   Title FOTO score </= 31% limitation    Time 6    Period Weeks    Status New    Target Date 11/20/19                 Plan - 11/13/19 1130    Clinical Impression Statement Patient reports her pain is 60% better but she continues to have parathesias in her right leg. Patient had trigger points in the lumbar paraspinals and gluteal. Patient had difficulty with prone lift opposite extremity due to weakness and pain. Patient is able to do the exercise without lifing her leg as high. Patient pelvis was in correct alignment. Patient does better with dry needling than iontophoresis. Patient will benefit from skilled therapy to reduce her pain and work  on core strength.    Personal Factors and Comorbidities Comorbidity 1;Fitness;Profession     Comorbidities breast cancer    Examination-Activity Limitations Sit;Locomotion Level;Stand    Stability/Clinical Decision Making Evolving/Moderate complexity    Rehab Potential Excellent    PT Frequency 1x / week    PT Duration 6 weeks    PT Treatment/Interventions Cryotherapy;Electrical Stimulation;Iontophoresis 4mg /ml Dexamethasone;Moist Heat;Traction;Neuromuscular re-education;Therapeutic exercise;Therapeutic activities;Patient/family education;Manual techniques;Dry needling;Spinal Manipulations    PT Next Visit Plan RT hip external rotation, dry needling, proceed with core and back strength; decide if discharge or continue therapy, if continue write renewal    PT Home Exercise Plan Access Code: [email or web address]    Recommended Other Services MD signed note    Consulted and Agree with Plan of Care Patient           Patient will benefit from skilled therapeutic intervention in order to improve the following deficits and impairments:  Decreased range of motion, Increased fascial restricitons, Pain, Decreased activity tolerance, Decreased endurance, Decreased strength  Visit Diagnosis: Muscle weakness (generalized)  Chronic right-sided low back pain without sciatica  Pain in right hip     Problem List Patient Active Problem List   Diagnosis Date Noted   Breast cancer, stage 2 ([place]) 02/02/2016   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 ([place]) 12/29/2015   Hyperlipidemia 05/17/2015   Routine health maintenance 02/03/2012   Degeneration of cervical intervertebral disc 09/01/2007   Allergic rhinitis 07/22/2007   GERD 07/22/2007   Mitral valve disorder 05/14/2007    Earlie Counts, PT 11/13/19 11:45 AM   [facility] [address], [place], [place], [address] Phone: [phone]   Fax:  [phone]  Name: ATHZIRY MILLICAN MRN: [id number] Date of Birth: 06-09-1960

## 2019-11-13 NOTE — Patient Instructions (Signed)
Access Code: [email or web address] URL: [email or web address] Date: 11/13/2019 Prepared by: Earlie Counts  Exercises Piriformis Mobilization with Wyvonnia Lora - 1 x daily - 7 x weekly - 1 sets - 10 reps Seated Piriformis Stretch with Trunk Bend - 1 x daily - 7 x weekly - 1 sets - 2 reps - 30 sec hold Standing ITB Stretch - 1 x daily - 7 x weekly - 1 sets - 2 reps - 30 sec hold Prone Alternating Arm and Leg Lifts - 1 x daily - 7 x weekly - 3 sets - 10 reps [facility] [address] [place], [place] [address] Phone # [phone] Fax [phone]

## 2019-11-20 ENCOUNTER — Encounter: Payer: Self-pay | Admitting: Physical Therapy

## 2019-11-20 ENCOUNTER — Ambulatory Visit: Payer: Self-pay | Admitting: Physical Therapy

## 2019-11-20 ENCOUNTER — Other Ambulatory Visit: Payer: Self-pay

## 2019-11-20 DIAGNOSIS — M6281 Muscle weakness (generalized): Secondary | ICD-10-CM

## 2019-11-20 DIAGNOSIS — M25551 Pain in right hip: Secondary | ICD-10-CM

## 2019-11-20 DIAGNOSIS — G8929 Other chronic pain: Secondary | ICD-10-CM

## 2019-11-20 NOTE — Therapy (Signed)
[facility] [address] [place], [place], [address] Phone: [phone]   Fax:  [phone]  Physical Therapy Treatment  Patient Details  Name: Cathy Woodward MRN: [id number] Date of Birth: 04-06-1961 Referring Provider (PT): Dr. Lynne Leader   Encounter Date: 11/20/2019   PT End of Session - 11/20/19 1142    Visit Number 5    Date for PT Re-Evaluation 01/01/20    Authorization Type self pay    PT Start Time 1100    PT Stop Time 1133    PT Time Calculation (min) 33 min    Activity Tolerance Patient tolerated treatment well;No increased pain    Behavior During Therapy [facility] for tasks assessed/performed           Past Medical History:  Diagnosis Date  . Abnormal EKG    septal Q waves  . Allergic rhinitis   . Brachial neuritis or radiculitis NOS   . 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 Chickenpox   . GERD (gastroesophageal reflux disease)   . Headache    migraines hx  . Heart murmur   . Hx of migraines   . Hyperlipidemia   . Leiomyoma of uterus, unspecified   . MVP (mitral valve prolapse)   . Palpitations    occasional  . Right-sided sensorineural hearing loss   . Scrofula    1 yr chest Xrays   . Shortness of breath dyspnea    d/t sinus issues  . Temporomandibular joint disorders, unspecified     Past Surgical History:  Procedure Laterality Date  . ABDOMINAL HYSTERECTOMY    . BREAST BIOPSY    . Excision of cervical lymph node anterior cervical chain    . MASTECTOMY Right 02/02/2016  . MOLE REMOVAL     buttock  . SIMPLE MASTECTOMY WITH AXILLARY SENTINEL NODE BIOPSY Right 02/02/2016   Procedure: RIGHT SIMPLE MASTECTOMY WITH AXILLARY SENTINEL NODE BIOPSY;  Surgeon: Erroll Luna, MD;  Location: [facility];  Service: General;  Laterality: Right;    There were no vitals filed for this visit.   Subjective Assessment - 11/20/19 1104    Subjective I am doing better. The one area  where I had dry needling and I had trouble. I stil lhave the radicular pain and is not as intense. The back and radicular pain is 80% better. The pain that prevents me from walking and household chores is not there.    Patient Stated Goals know exercises to keep pain in control, understand how to manage pain    Currently in Pain? Yes    Pain Score 3     Pain Location Hip    Pain Orientation Right    Pain Descriptors / Indicators Sore    Pain Type Chronic pain    Pain Onset More than a month ago    Pain Frequency Constant    Aggravating Factors  prolonged walking, sometimes I just do not know why    Pain Relieving Factors dry needling    Multiple Pain Sites No              [facility] PT Assessment - 11/20/19 0001      Assessment   Medical Diagnosis M70.01 Trochanteric bursitis of right hip ;M5417 Lumbosacral radiculopathy at L5     Referring Provider (PT) Dr. Lynne Leader    Prior Therapy yes      Precautions   Precautions Other (comment)    Precaution Comments  breast cancer with radiation      Restrictions   Weight Bearing Restrictions No      Cognition   Overall Cognitive Status Within Functional Limits for tasks assessed      Strength   Right Hip Extension 5/5    Right Hip External Rotation  5/5    Right Hip Internal Rotation 5/5    Right Hip ABduction 4+/5    Left Hip Flexion 5/5    Left Hip Extension 5/5    Left Hip Internal Rotation 4/5    Left Hip ABduction 3+/5      Palpation   SI assessment  left ilium higher than right                         [facility] Adult PT Treatment/Exercise - 11/20/19 0001      Lumbar Exercises: Stretches   Active Hamstring Stretch Right;Left;1 rep;30 seconds    Active Hamstring Stretch Limitations using strap      Knee/Hip Exercises: Stretches   Piriformis Stretch Right;Left;2 reps;30 seconds    Piriformis Stretch Limitations supine and sitting      Knee/Hip Exercises: Aerobic   Stationary Bike level 1 for 6 minutes  while assessing patient      Knee/Hip Exercises: Supine   Bridges Strengthening;1 set;10 reps    Bridges with Clamshell Strengthening;1 set;15 reps   yellow band   Other Supine Knee/Hip Exercises push leg to make longer on the left 20x      Knee/Hip Exercises: Sidelying   Other Sidelying Knee/Hip Exercises push pelvic down on the left with therapist resisting      Knee/Hip Exercises: Prone   Other Prone Exercises hip abduction with yellow band 15x each leg keeping trunk still      Manual Therapy   Manual Therapy Joint mobilization    Joint Mobilization left leg pull                   PT Education - 11/20/19 1134    Education Details Access Code: [email or web address]    Person(s) Educated Patient    Methods Explanation;Demonstration;Verbal cues;Handout    Comprehension Returned demonstration;Verbalized understanding            PT Short Term Goals - 11/13/19 1144      PT SHORT TERM GOAL #1   Title independent with intial HEP    Time 4    Period Weeks    Status Achieved      PT SHORT TERM GOAL #2   Title understand correct body mechanics with daily and work tasks to reduce strain on her lumbar    Time 4    Period Weeks    Status Achieved             PT Long Term Goals - 11/20/19 1138      PT LONG TERM GOAL #1   Title independent with advanced HEP    Time 6    Period Weeks    Status On-going      PT LONG TERM GOAL #2   Title able to walk from the end of the parking lot to walk to work instead of closer due to pain decresaed to a manageable level and increased in hip strength    Time 6    Period Weeks    Status On-going      PT LONG TERM GOAL #3   Title understand how to manage her pain with understanding the pain  model, using meditation, and correcting her posture    Time 6    Period Weeks    Status Achieved      PT LONG TERM GOAL #4   Title FOTO score </= 31% limitation    Time 6    Period Weeks    Status On-going                 Plan -  11/20/19 1120    Clinical Impression Statement Patient reports she does not have the pain that stops her from walking or doing her household chores. Patient left ilium is higher than the right. Bilateral hip abduction is weak with left worse than the right. Patient does not have trigger points in the left gluteal. She reports her radicular pain is 80% better. Patient will benefit from skilled therapy to improve strength and stability to reduce her pain.    Personal Factors and Comorbidities Comorbidity 1;Fitness;Profession    Comorbidities breast cancer    Examination-Activity Limitations Sit;Locomotion Level;Stand    Stability/Clinical Decision Making Evolving/Moderate complexity    Rehab Potential Excellent    PT Frequency 1x / week    PT Duration 6 weeks    PT Treatment/Interventions Cryotherapy;Electrical Stimulation;Iontophoresis 4mg /ml Dexamethasone;Moist Heat;Traction;Neuromuscular re-education;Therapeutic exercise;Therapeutic activities;Patient/family education;Manual techniques;Dry needling;Spinal Manipulations    PT Next Visit Plan advance HEP for gluteal strength, lumbar extensors and pelvic stabilit    PT Home Exercise Plan Access Code: [facility]    Recommended Other Services sent renewal on 11/20/2019    Consulted and Agree with Plan of Care Patient           Patient will benefit from skilled therapeutic intervention in order to improve the following deficits and impairments:  Decreased range of motion, Increased fascial restricitons, Pain, Decreased activity tolerance, Decreased endurance, Decreased strength  Visit Diagnosis: Muscle weakness (generalized) - Plan: PT plan of care cert/re-cert  Chronic right-sided low back pain without sciatica - Plan: PT plan of care cert/re-cert  Pain in right hip - Plan: PT plan of care cert/re-cert     Problem List Patient Active Problem List   Diagnosis Date Noted  . Breast cancer, stage 2 ([place]) 02/02/2016  . Cancer of central  portion of right female breast ([place]) 12/29/2015  . Hyperlipidemia 05/17/2015  . Routine health maintenance 02/03/2012  . Degeneration of cervical intervertebral disc 09/01/2007  . Allergic rhinitis 07/22/2007  . GERD 07/22/2007  . Mitral valve disorder 05/14/2007    Earlie Counts, PT 11/20/19 11:49 AM   [facility] [address] [place], [place], [address] Phone: [phone]   Fax:  [phone]  Name: JESUSA STENERSON MRN: [id number] Date of Birth: Oct 02, 1960

## 2019-11-20 NOTE — Patient Instructions (Signed)
Access Code: [email or web address] URL: [email or web address] Date: 11/20/2019 Prepared by: Earlie Counts  Program Notes lay on back making left leg longer 15 times ( Elvis Presley Ex. )   Exercises Piriformis Mobilization with Small Ball - 1 x daily - 7 x weekly - 1 sets - 10 reps Seated Piriformis Stretch with Trunk Bend - 1 x daily - 7 x weekly - 1 sets - 2 reps - 30 sec hold Standing ITB Stretch - 1 x daily - 7 x weekly - 1 sets - 2 reps - 30 sec hold Prone Alternating Arm and Leg Lifts - 1 x daily - 7 x weekly - 3 sets - 10 reps Prone Hip Abduction on Slider - 1 x daily - 7 x weekly - 2 sets - 10 reps Bridge with Hip Abduction and Resistance - 1 x daily - 7 x weekly - 1 sets - 10 reps [facility] [address] [place], [place] [address] Phone # [phone] Fax [phone]

## 2019-11-27 ENCOUNTER — Other Ambulatory Visit: Payer: Self-pay

## 2019-11-27 ENCOUNTER — Encounter: Payer: Self-pay | Admitting: Physical Therapy

## 2019-11-27 ENCOUNTER — Ambulatory Visit: Payer: Self-pay | Attending: Family Medicine | Admitting: Physical Therapy

## 2019-11-27 DIAGNOSIS — M545 Low back pain, unspecified: Secondary | ICD-10-CM

## 2019-11-27 DIAGNOSIS — G8929 Other chronic pain: Secondary | ICD-10-CM | POA: Insufficient documentation

## 2019-11-27 DIAGNOSIS — M25551 Pain in right hip: Secondary | ICD-10-CM | POA: Insufficient documentation

## 2019-11-27 DIAGNOSIS — M6281 Muscle weakness (generalized): Secondary | ICD-10-CM | POA: Insufficient documentation

## 2019-11-27 NOTE — Patient Instructions (Signed)
Access Code: [email or web address] URL: [email or web address] Date: 11/27/2019 Prepared by: Earlie Counts  Program Notes lay on back making left leg longer 15 times ( Elvis Presley Ex. )   Exercises Piriformis Mobilization with Small Ball - 1 x daily - 7 x weekly - 1 sets - 10 reps Seated Piriformis Stretch with Trunk Bend - 1 x daily - 7 x weekly - 1 sets - 2 reps - 30 sec hold Standing ITB Stretch - 1 x daily - 7 x weekly - 1 sets - 2 reps - 30 sec hold Prone Alternating Arm and Leg Lifts - 1 x daily - 7 x weekly - 3 sets - 10 reps Prone Hip Abduction on Slider - 1 x daily - 7 x weekly - 2 sets - 10 reps Bridge with Hip Abduction and Resistance - 1 x daily - 7 x weekly - 1 sets - 10 reps Bird Dog - 1 x daily - 7 x weekly - 1 sets - 10 reps [facility] [address] [place], [place] [address] Phone # [phone] Fax [phone]

## 2019-11-27 NOTE — Therapy (Signed)
[facility] [address], [place], [place], [address] Phone: [phone]   Fax:  [phone]  Physical Therapy Treatment  Patient Details  Name: Cathy Woodward MRN: [id number] Date of Birth: 1960-06-18 Referring Provider (PT): Dr. Lynne Leader   Encounter Date: 11/27/2019   PT End of Session - 11/27/19 1139    Visit Number 6    Date for PT Re-Evaluation 01/01/20    Authorization Type self pay    PT Start Time 1100    PT Stop Time 1132    PT Time Calculation (min) 32 min    Activity Tolerance Patient tolerated treatment well;No increased pain    Behavior During Therapy WFL for tasks assessed/performed           Past Medical History:  Diagnosis Date  . Abnormal EKG    septal Q waves  . Allergic rhinitis   . Brachial neuritis or radiculitis NOS   . 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 Chickenpox   . GERD (gastroesophageal reflux disease)   . Headache    migraines hx  . Heart murmur   . Hx of migraines   . Hyperlipidemia   . Leiomyoma of uterus, unspecified   . MVP (mitral valve prolapse)   . Palpitations    occasional  . Right-sided sensorineural hearing loss   . Scrofula    1 yr chest Xrays   . Shortness of breath dyspnea    d/t sinus issues  . Temporomandibular joint disorders, unspecified     Past Surgical History:  Procedure Laterality Date  . ABDOMINAL HYSTERECTOMY    . BREAST BIOPSY    . Excision of cervical lymph node anterior cervical chain    . MASTECTOMY Right 02/02/2016  . MOLE REMOVAL     buttock  . SIMPLE MASTECTOMY WITH AXILLARY SENTINEL NODE BIOPSY Right 02/02/2016   Procedure: RIGHT SIMPLE MASTECTOMY WITH AXILLARY SENTINEL NODE BIOPSY;  Surgeon: Erroll Luna, MD;  Location: [facility];  Service: General;  Laterality: Right;    There were no vitals filed for this visit.   Subjective Assessment - 11/27/19 1102    Subjective My pain is better. My radicular  level are back to the starting level.    Patient Stated Goals know exercises to keep pain in control, understand how to manage pain    Currently in Pain? Yes    Pain Score 3     Pain Location Hip    Pain Orientation Left;Right    Pain Descriptors / Indicators Sore    Pain Type Chronic pain    Pain Onset More than a month ago    Pain Frequency Constant    Aggravating Factors  prolonged walking, sometimes I just do not know why    Pain Relieving Factors dry needling    Multiple Pain Sites No                             [facility] Adult PT Treatment/Exercise - 11/27/19 0001      Knee/Hip Exercises: Aerobic   Nustep L 3 x 6 min warm up with discussion on status      Knee/Hip Exercises: Supine   Bridges with Clamshell Strengthening;Both;1 set;20 reps   red band   Other Supine Knee/Hip Exercises quadruped lift opposite extremitiy with verbal cues to not lean toward the right hip intead to contract the right gluteals  Knee/Hip Exercises: Prone   Other Prone Exercises alternate shoulder and hip extension 10x each way while monitoring for pain and control    Other Prone Exercises hip abduction with yellow band 15x each leg keeping trunk still                  PT Education - 11/27/19 1139    Education Details Access Code: [employer]    Person(s) Educated Patient    Methods Explanation;Demonstration;Verbal cues;Handout    Comprehension Returned demonstration;Verbalized understanding            PT Short Term Goals - 11/13/19 1144      PT SHORT TERM GOAL #1   Title independent with intial HEP    Time 4    Period Weeks    Status Achieved      PT SHORT TERM GOAL #2   Title understand correct body mechanics with daily and work tasks to reduce strain on her lumbar    Time 4    Period Weeks    Status Achieved             PT Long Term Goals - 11/20/19 1138      PT LONG TERM GOAL #1   Title independent with advanced HEP    Time 6    Period Weeks     Status On-going      PT LONG TERM GOAL #2   Title able to walk from the end of the parking lot to walk to work instead of closer due to pain decresaed to a manageable level and increased in hip strength    Time 6    Period Weeks    Status On-going      PT LONG TERM GOAL #3   Title understand how to manage her pain with understanding the pain model, using meditation, and correcting her posture    Time 6    Period Weeks    Status Achieved      PT LONG TERM GOAL #4   Title FOTO score </= 31% limitation    Time 6    Period Weeks    Status On-going                 Plan - 11/27/19 1111    Clinical Impression Statement Patient pain level for radicular symptoms is now at her prior level before flare-up. Patient has difficulty with tightening one gluteal instead only abe to tighten both. Patient has more difficulty with stabilizing the right hip with quadruped lift opposite arm and leg. Patient has difficulty doing her exercises daily so she will do 2-3 times per week due to her schedule. Patient will benefit from skilled therapy to improve strengtrh and stabilty to reduce her pain.    Personal Factors and Comorbidities Comorbidity 1;Fitness;Profession    Comorbidities breast cancer    Examination-Activity Limitations Sit;Locomotion Level;Stand    Stability/Clinical Decision Making Evolving/Moderate complexity    Rehab Potential Excellent    PT Frequency 1x / week    PT Duration 6 weeks    PT Treatment/Interventions Cryotherapy;Electrical Stimulation;Iontophoresis 4mg /ml Dexamethasone;Moist Heat;Traction;Neuromuscular re-education;Therapeutic exercise;Therapeutic activities;Patient/family education;Manual techniques;Dry needling;Spinal Manipulations    PT Next Visit Plan advance HEP for gluteal strength, lumbar extensors and pelvic stability; update goals    PT Home Exercise Plan Access Code: [email or web address]    Recommended Other Services all notes signed by MD    Consulted and Agree with  Plan of Care Patient  Patient will benefit from skilled therapeutic intervention in order to improve the following deficits and impairments:  Decreased range of motion, Increased fascial restricitons, Pain, Decreased activity tolerance, Decreased endurance, Decreased strength  Visit Diagnosis: Muscle weakness (generalized)  Chronic right-sided low back pain without sciatica  Pain in right hip     Problem List Patient Active Problem List   Diagnosis Date Noted  . Breast cancer, stage 2 ([place]) 02/02/2016  . 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 ([place]) 12/29/2015  . Hyperlipidemia 05/17/2015  . Routine health maintenance 02/03/2012  . Degeneration of cervical intervertebral disc 09/01/2007  . Allergic rhinitis 07/22/2007  . GERD 07/22/2007  . Mitral valve disorder 05/14/2007    Earlie Counts, PT 11/27/19 11:47 AM   [facility] [address], [place], [place], [address] Phone: [phone]   Fax:  [phone]  Name: Cathy Woodward MRN: [id number] Date of Birth: 1961/02/14

## 2019-12-04 ENCOUNTER — Other Ambulatory Visit: Payer: Self-pay

## 2019-12-04 ENCOUNTER — Ambulatory Visit: Payer: Self-pay | Admitting: Physical Therapy

## 2019-12-04 ENCOUNTER — Encounter: Payer: Self-pay | Admitting: Physical Therapy

## 2019-12-04 DIAGNOSIS — G8929 Other chronic pain: Secondary | ICD-10-CM

## 2019-12-04 DIAGNOSIS — M25551 Pain in right hip: Secondary | ICD-10-CM

## 2019-12-04 DIAGNOSIS — M6281 Muscle weakness (generalized): Secondary | ICD-10-CM

## 2019-12-04 NOTE — Therapy (Signed)
[facility] [address] [place], [place], [address] Phone: [phone]   Fax:  [phone]  Physical Therapy Treatment  Patient Details  Name: Cathy Woodward MRN: [id number] Date of Birth: [birth year] Referring Provider (PT): Dr. Lynne Leader   Encounter Date: 12/04/2019   PT End of Session - 12/04/19 1017    Visit Number 7    Date for PT Re-Evaluation 01/01/20    Authorization Type self pay    PT Start Time 1017    PT Stop Time 1055    PT Time Calculation (min) 38 min    Activity Tolerance Patient tolerated treatment well    Behavior During Therapy [facility] for tasks assessed/performed           Past Medical History:  Diagnosis Date  . Abnormal EKG    septal Q waves  . Allergic rhinitis   . Brachial neuritis or radiculitis NOS   . 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 Chickenpox   . GERD (gastroesophageal reflux disease)   . Headache    migraines hx  . Heart murmur   . Hx of migraines   . Hyperlipidemia   . Leiomyoma of uterus, unspecified   . MVP (mitral valve prolapse)   . Palpitations    occasional  . Right-sided sensorineural hearing loss   . Scrofula    1 yr chest Xrays   . Shortness of breath dyspnea    d/t sinus issues  . Temporomandibular joint disorders, unspecified     Past Surgical History:  Procedure Laterality Date  . ABDOMINAL HYSTERECTOMY    . BREAST BIOPSY    . Excision of cervical lymph node anterior cervical chain    . MASTECTOMY Right 02/02/2016  . MOLE REMOVAL     buttock  . SIMPLE MASTECTOMY WITH AXILLARY SENTINEL NODE BIOPSY Right 02/02/2016   Procedure: RIGHT SIMPLE MASTECTOMY WITH AXILLARY SENTINEL NODE BIOPSY;  Surgeon: Erroll Luna, MD;  Location: [facility];  Service: General;  Laterality: Right;    There were no vitals filed for this visit.   Subjective Assessment - 12/04/19 1023    Subjective Low level pain this AM.    Currently in Pain? Yes      Pain Score 2     Pain Location Hip    Pain Orientation Right;Left    Pain Descriptors / Indicators Sore                             [facility] Adult PT Treatment/Exercise - 12/04/19 0001      Lumbar Exercises: Quadruped   Other Quadruped Lumbar Exercises Static holds in proper position , 3 sets of 10 sec: Used noodle to give pt TC for posterior chain activation, 1 rep of each arm/leg holding shape.       Knee/Hip Exercises: Aerobic   Nustep L3 x 8 min with discussion of importance of HEP adherence.      Knee/Hip Exercises: Standing   Other Standing Knee Exercises Standing hip hinge 10 sec, added to HEP      Knee/Hip Exercises: Supine   Bridges Strengthening;Both;1 set;10 reps    Bridges Limitations VC for technique    Bridges with Clamshell Strengthening;Both;1 set;10 reps   red band, VC to keep glutes engaged to decelerate                 PT Education - 12/04/19 1101  Education Details HEP, standing hip hinge    Person(s) Educated Patient    Methods Explanation;Demonstration;Tactile cues;Verbal cues;Handout    Comprehension Returned demonstration;Verbalized understanding            PT Short Term Goals - 11/13/19 1144      PT SHORT TERM GOAL #1   Title independent with intial HEP    Time 4    Period Weeks    Status Achieved      PT SHORT TERM GOAL #2   Title understand correct body mechanics with daily and work tasks to reduce strain on her lumbar    Time 4    Period Weeks    Status Achieved             PT Long Term Goals - 11/20/19 1138      PT LONG TERM GOAL #1   Title independent with advanced HEP    Time 6    Period Weeks    Status On-going      PT LONG TERM GOAL #2   Title able to walk from the end of the parking lot to walk to work instead of closer due to pain decresaed to a manageable level and increased in hip strength    Time 6    Period Weeks    Status On-going      PT LONG TERM GOAL #3   Title understand how to  manage her pain with understanding the pain model, using meditation, and correcting her posture    Time 6    Period Weeks    Status Achieved      PT LONG TERM GOAL #4   Title FOTO score </= 31% limitation    Time 6    Period Weeks    Status On-going                 Plan - 12/04/19 1033    Clinical Impression Statement Pt arrives with low level hip pain. She requested additional back strengthening exercises for home. PTA instructed pt in static hip hinge that hopefully she finds easier to incorporate into her days. Pt also has difficulty holding her quadruped position correctly, as she tends to round her thoracic spine excessively, hang her head and reduce her lumbar lordosis. PTA used demonstartion and tactile cuing to help increase her kinesthetic sense. She will work on this at home.    Personal Factors and Comorbidities Comorbidity 1;Fitness;Profession    Comorbidities breast cancer    Examination-Activity Limitations Sit;Locomotion Level;Stand    Stability/Clinical Decision Making Evolving/Moderate complexity    Rehab Potential Excellent    PT Frequency 1x / week    PT Duration 6 weeks    PT Treatment/Interventions Cryotherapy;Electrical Stimulation;Iontophoresis 4mg /ml Dexamethasone;Moist Heat;Traction;Neuromuscular re-education;Therapeutic exercise;Therapeutic activities;Patient/family education;Manual techniques;Dry needling;Spinal Manipulations    PT Next Visit Plan Review goals, check technique with hip hinge exercise given today and see how pt's endurance is doing holding her quadruped postion.    PT Home Exercise Plan Access Code: [facility]           Patient will benefit from skilled therapeutic intervention in order to improve the following deficits and impairments:  Decreased range of motion, Increased fascial restricitons, Pain, Decreased activity tolerance, Decreased endurance, Decreased strength  Visit Diagnosis: Chronic right-sided low back pain without  sciatica  Muscle weakness (generalized)  Pain in right hip     Problem List Patient Active Problem List   Diagnosis Date Noted  . Breast cancer, stage 2 ([place])  02/02/2016  . 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 ([place]) 12/29/2015  . Hyperlipidemia 05/17/2015  . Routine health maintenance 02/03/2012  . Degeneration of cervical intervertebral disc 09/01/2007  . Allergic rhinitis 07/22/2007  . GERD 07/22/2007  . Mitral valve disorder 05/14/2007    Jhane Lorio, PTA 12/04/2019, 11:59 AM  [facility] [address] [place], [place], [address] Phone: [phone]   Fax:  [phone]  Name: Cathy Woodward MRN: [id number] Date of Birth: 06-Feb-1961 Access Code: [email or web address]: [email or web address]: 07/09/2021Prepared by: Ailene Rud Notes lay on back making left leg longer 15 times ( Elvis Presley Ex. ) Exercises  Piriformis Mobilization with Small Ball - 1 x daily - 7 x weekly - 1 sets - 10 reps  Seated Piriformis Stretch with Trunk Bend - 1 x daily - 7 x weekly - 1 sets - 2 reps - 30 sec hold  Standing ITB Stretch - 1 x daily - 7 x weekly - 1 sets - 2 reps - 30 sec hold  Prone Alternating Arm and Leg Lifts - 1 x daily - 7 x weekly - 3 sets - 10 reps  Prone Hip Abduction on Slider - 1 x daily - 7 x weekly - 2 sets - 10 reps  Bridge with Hip Abduction and Resistance - 1 x daily - 7 x weekly - 1 sets - 10 reps  Bird Dog - 1 x daily - 7 x weekly - 1 sets - 10 reps  Half Dead Lift with Kettlebell - 1 x daily - 7 x weekly - 1 reps - 60 hold

## 2019-12-11 ENCOUNTER — Other Ambulatory Visit: Payer: Self-pay

## 2019-12-11 ENCOUNTER — Encounter: Payer: Self-pay | Admitting: Physical Therapy

## 2019-12-11 ENCOUNTER — Ambulatory Visit: Payer: Self-pay | Admitting: Physical Therapy

## 2019-12-11 DIAGNOSIS — M6281 Muscle weakness (generalized): Secondary | ICD-10-CM

## 2019-12-11 DIAGNOSIS — M25551 Pain in right hip: Secondary | ICD-10-CM

## 2019-12-11 DIAGNOSIS — G8929 Other chronic pain: Secondary | ICD-10-CM

## 2019-12-11 NOTE — Therapy (Signed)
[facility] [address] [place], [place], [address] Phone: [phone]   Fax:  [phone]  Physical Therapy Treatment  Patient Details  Name: Cathy Woodward MRN: [id number] Date of Birth: Dec 13, 1960 Referring Provider (PT): Dr. Clementeen Graham   Encounter Date: 12/11/2019   PT End of Session - 12/11/19 1146    Visit Number 8    Date for PT Re-Evaluation 01/01/20    Authorization Type self pay    PT Start Time 1100    PT Stop Time 1140    PT Time Calculation (min) 40 min    Activity Tolerance Patient tolerated treatment well    Behavior During Therapy [facility] for tasks assessed/performed           Past Medical History:  Diagnosis Date  . Abnormal EKG    septal Q waves  . Allergic rhinitis   . Brachial neuritis or radiculitis NOS   . 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 Chickenpox   . GERD (gastroesophageal reflux disease)   . Headache    migraines hx  . Heart murmur   . Hx of migraines   . Hyperlipidemia   . Leiomyoma of uterus, unspecified   . MVP (mitral valve prolapse)   . Palpitations    occasional  . Right-sided sensorineural hearing loss   . Scrofula    1 yr chest Xrays   . Shortness of breath dyspnea    d/t sinus issues  . Temporomandibular joint disorders, unspecified     Past Surgical History:  Procedure Laterality Date  . ABDOMINAL HYSTERECTOMY    . BREAST BIOPSY    . Excision of cervical lymph node anterior cervical chain    . MASTECTOMY Right 02/02/2016  . MOLE REMOVAL     buttock  . SIMPLE MASTECTOMY WITH AXILLARY SENTINEL NODE BIOPSY Right 02/02/2016   Procedure: RIGHT SIMPLE MASTECTOMY WITH AXILLARY SENTINEL NODE BIOPSY;  Surgeon: Harriette Bouillon, MD;  [facility];  Service: General;  Laterality: Right;    There were no vitals filed for this visit.   Subjective Assessment - 12/11/19 1102    Subjective When I am driiving I get more of the hip pain.     Patient Stated Goals know exercises to keep pain in control, understand how to manage pain    Currently in Pain? Yes    Pain Score 8     Pain Location Hip    Pain Orientation Right;Left    Pain Descriptors / Indicators Sore    Pain Type Chronic pain    Pain Onset More than a month ago    Pain Frequency Constant    Aggravating Factors  prolonged walking, walking    Pain Relieving Factors dry needling    Multiple Pain Sites No              [facility] PT Assessment - 12/11/19 0001      Assessment   Medical Diagnosis M70.01 Trochanteric bursitis of right hip ;M5417 Lumbosacral radiculopathy at L5     Referring Provider (PT) Dr. Clementeen Graham    Prior Therapy yes      Precautions   Precautions Other (comment)    Precaution Comments breast cancer with radiation      Restrictions   Weight Bearing Restrictions No      Cognition   Overall Cognitive Status Within Functional Limits for tasks assessed      Posture/Postural Control   Posture/Postural Control Postural  limitations    Postural Limitations Rounded Shoulders;Forward head;Flexed trunk      AROM   Lumbar Flexion decreased by 25%, deviate to the right    Lumbar Extension decreased by 25%     Lumbar - Right Side Bend decreased by 25%    Lumbar - Left Side Bend decreased by 25%    Lumbar - Right Rotation decreased by 25%    Lumbar - Left Rotation decreased by 25%      PROM   Right Hip External Rotation  35    Left Hip External Rotation  40      Strength   Right Hip Extension 5/5    Right Hip External Rotation  5/5    Right Hip Internal Rotation 5/5    Right Hip ABduction 4+/5    Left Hip Flexion 5/5    Left Hip Extension 5/5    Left Hip Internal Rotation 4/5    Left Hip ABduction 4-/5      Palpation   SI assessment  left ilium higher than right                         [facility] Adult PT Treatment/Exercise - 12/11/19 0001      Self-Care   Self-Care Other Self-Care Comments    Other Self-Care Comments   sitting on tennis ball or laying down  to massage the right gluteal and piriformis, and hip rotators      Lumbar Exercises: Quadruped   Opposite Arm/Leg Raise Right arm/Left leg;Left arm/Right leg;10 reps;1 second    Opposite Arm/Leg Raise Limitations keeping spinal neutral      Knee/Hip Exercises: Standing   Other Standing Knee Exercises Standing hip hinge 10 sec with VC to extend spine      Knee/Hip Exercises: Supine   Bridges Strengthening;Both;1 set;10 reps    Bridges Limitations VC for technique    Bridges with Clamshell Strengthening;Both;1 set;10 reps   red band, VC to keep glutes engaged to decelerate     Knee/Hip Exercises: Sidelying   Clams 10 with VC to go slow and control with feet apart and hip rotation    Other Sidelying Knee/Hip Exercises hip IR in sidely 10x each side      Knee/Hip Exercises: Prone   Other Prone Exercises hip abduction with yellow band 15x each leg keeping trunk still                  PT Education - 12/11/19 1141    Education Details Access Code: [email or web address]    Person(s) Educated Patient    Methods Explanation;Demonstration;Verbal cues;Handout    Comprehension Returned demonstration;Verbalized understanding            PT Short Term Goals - 11/13/19 1144      PT SHORT TERM GOAL #1   Title independent with intial HEP    Time 4    Period Weeks    Status Achieved      PT SHORT TERM GOAL #2   Title understand correct body mechanics with daily and work tasks to reduce strain on her lumbar    Time 4    Period Weeks    Status Achieved             PT Long Term Goals - 12/11/19 1148      PT LONG TERM GOAL #1   Title independent with advanced HEP    Time 6    Period Weeks  Status Achieved      PT LONG TERM GOAL #2   Title able to walk from the end of the parking lot to walk to work instead of closer due to pain decresaed to a manageable level and increased in hip strength    Time 6    Period Weeks    Status Achieved       PT LONG TERM GOAL #3   Title understand how to manage her pain with understanding the pain model, using meditation, and correcting her posture    Time 6    Period Weeks    Status Achieved      PT LONG TERM GOAL #4   Title FOTO score </= 31% limitation    Time 6    Period Weeks    Status Unable to assess                 Plan - 12/11/19 1115    Clinical Impression Statement Patient has more pain in right with driving and in static position. Patient understands how to do her HEP correctly. Patient has increased bilateral hip strength. Patient understands how to manage her pain. Patient is able to walk in the parking lot with pain at a manageable level.    Personal Factors and Comorbidities Comorbidity 1;Fitness;Profession    Comorbidities breast cancer    Examination-Activity Limitations Sit;Locomotion Level;Stand    Stability/Clinical Decision Making Evolving/Moderate complexity    Rehab Potential Excellent    PT Treatment/Interventions Cryotherapy;Electrical Stimulation;Iontophoresis '4mg'$ /ml Dexamethasone;Moist Heat;Traction;Neuromuscular re-education;Therapeutic exercise;Therapeutic activities;Patient/family education;Manual techniques;Dry needling;Spinal Manipulations    PT Next Visit Plan Discharge to HEP    PT Home Exercise Plan Access Code: [email or web address]    Consulted and Agree with Plan of Care Patient           Patient will benefit from skilled therapeutic intervention in order to improve the following deficits and impairments:  Decreased range of motion, Increased fascial restricitons, Pain, Decreased activity tolerance, Decreased endurance, Decreased strength  Visit Diagnosis: Chronic right-sided low back pain without sciatica  Muscle weakness (generalized)  Pain in right hip     Problem List Patient Active Problem List   Diagnosis Date Noted  . Breast cancer, stage 2 ([place]) 02/02/2016  . 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 ([place]) 12/29/2015  .  Hyperlipidemia 05/17/2015  . Routine health maintenance 02/03/2012  . Degeneration of cervical intervertebral disc 09/01/2007  . Allergic rhinitis 07/22/2007  . GERD 07/22/2007  . Mitral valve disorder 05/14/2007    Earlie Counts, PT 12/11/19 11:50 AM   [facility] [address] [place], [place], [address] Phone: [phone]   Fax:  [phone]  Name: Cathy Woodward MRN: [id number] Date of Birth: [birth year] PHYSICAL THERAPY DISCHARGE SUMMARY  Visits from Start of Care: 8  Current functional level related to goals / functional outcomes: See above.   Remaining deficits: See above.  Education / Equipment: HEP Plan: Patient agrees to discharge.  Patient goals were met. Patient is being discharged due to meeting the stated rehab goals. Thank you for the referral. Earlie Counts, PT 12/11/19 11:51 AM   ?????

## 2019-12-11 NOTE — Patient Instructions (Signed)
Access Code: [email or web address] URL: [email or web address] Date: 12/11/2019 Prepared by: Earlie Counts  Program Notes lay on back making left leg longer 15 times ( Elvis Presley Ex. )   Exercises Piriformis Mobilization with Small Ball - 1 x daily - 7 x weekly - 1 sets - 10 reps Seated Piriformis Stretch with Trunk Bend - 1 x daily - 7 x weekly - 1 sets - 2 reps - 30 sec hold Standing ITB Stretch - 1 x daily - 7 x weekly - 1 sets - 2 reps - 30 sec hold Prone Hip Abduction on Slider - 1 x daily - 7 x weekly - 2 sets - 10 reps Bridge with Hip Abduction and Resistance - 1 x daily - 7 x weekly - 1 sets - 10 reps Bird Dog - 1 x daily - 7 x weekly - 1 sets - 10 reps Half Dead Lift with Kettlebell - 1 x daily - 7 x weekly - 1 reps - 60 hold Clamshell - 1 x daily - 7 x weekly - 1 sets - 5 reps Sidelying Reverse Clamshell - 1 x daily - 7 x weekly - 1 sets - 10 reps [facility] [address] [place], [place] [address] Phone # [phone] Fax [phone]

## 2020-01-01 ENCOUNTER — Ambulatory Visit (INDEPENDENT_AMBULATORY_CARE_PROVIDER_SITE_OTHER): Payer: Self-pay | Admitting: Internal Medicine

## 2020-01-01 ENCOUNTER — Other Ambulatory Visit: Payer: Self-pay

## 2020-01-01 ENCOUNTER — Encounter: Payer: Self-pay | Admitting: Internal Medicine

## 2020-01-01 VITALS — BP 112/68 | HR 65 | Temp 97.6°F | Ht 67.0 in | Wt 113.0 lb

## 2020-01-01 DIAGNOSIS — Z1152 Encounter for screening for COVID-19: Secondary | ICD-10-CM

## 2020-01-01 DIAGNOSIS — C50911 Malignant neoplasm of unspecified site of right female breast: Secondary | ICD-10-CM

## 2020-01-01 DIAGNOSIS — Z0000 Encounter for general adult medical examination without abnormal findings: Secondary | ICD-10-CM

## 2020-01-01 DIAGNOSIS — E782 Mixed hyperlipidemia: Secondary | ICD-10-CM

## 2020-01-01 LAB — CBC WITH DIFFERENTIAL/PLATELET
Absolute Monocytes: 546 cells/uL (ref 200–950)
Basophils Absolute: 72 cells/uL (ref 0–200)
Basophils Relative: 1.1 %
Eosinophils Absolute: 559 cells/uL — ABNORMAL HIGH (ref 15–500)
Eosinophils Relative: 8.6 %
HCT: 42.4 % (ref 35.0–45.0)
Hemoglobin: 13.7 g/dL (ref 11.7–15.5)
Lymphs Abs: 2126 cells/uL (ref 850–3900)
MCH: 25.6 pg — ABNORMAL LOW (ref 27.0–33.0)
MCHC: 32.3 g/dL (ref 32.0–36.0)
MCV: 79.1 fL — ABNORMAL LOW (ref 80.0–100.0)
MPV: 10.1 fL (ref 7.5–12.5)
Monocytes Relative: 8.4 %
Neutro Abs: 3198 cells/uL (ref 1500–7800)
Neutrophils Relative %: 49.2 %
Platelets: 234 10*3/uL (ref 140–400)
RBC: 5.36 10*6/uL — ABNORMAL HIGH (ref 3.80–5.10)
RDW: 13.6 % (ref 11.0–15.0)
Total Lymphocyte: 32.7 %
WBC: 6.5 10*3/uL (ref 3.8–10.8)

## 2020-01-01 MED ORDER — ANASTROZOLE 1 MG PO TABS: 1.0000 mg | ORAL_TABLET | Freq: Every day | ORAL | 0 refills | Status: DC

## 2020-01-01 NOTE — Progress Notes (Addendum)
   Subjective:   Patient ID: Cathy Woodward, female    DOB: 17-Aug-1960, 59 y.o.   MRN: [id number]  HPI The patient is a 59 YO female coming in for follow up cholesterol (higher than previous last time, is now exercising more with lumbar stretches, denies change in diet), and doing well overall.   Review of Systems  Constitutional: Negative.   HENT: Negative.   Eyes: Negative.   Respiratory: Negative for cough, chest tightness and shortness of breath.   Cardiovascular: Negative for chest pain, palpitations and leg swelling.  Gastrointestinal: Negative for abdominal distention, abdominal pain, constipation, diarrhea, nausea and vomiting.  Musculoskeletal: Negative.   Skin: Negative.   Neurological: Negative.   Psychiatric/Behavioral: Negative.     Objective:  Physical Exam Constitutional:      Appearance: She is well-developed.  HENT:     Head: Normocephalic and atraumatic.  Cardiovascular:     Rate and Rhythm: Normal rate and regular rhythm.  Pulmonary:     Effort: Pulmonary effort is normal. No respiratory distress.     Breath sounds: Normal breath sounds. No wheezing or rales.  Abdominal:     General: Bowel sounds are normal.  Musculoskeletal:     Cervical back: Normal range of motion.  Skin:    General: Skin is warm and dry.  Neurological:     Mental Status: She is alert and oriented to person, place, and time.     Coordination: Coordination normal.     Vitals:   01/01/20 0816  BP: 112/68  Pulse: 65  Temp: 97.6 F (36.4 C)  TempSrc: Oral  SpO2: 97%  Weight: 113 lb (51.3 kg)  Height: 5\' 7"  (1.702 m)    This visit occurred during the SARS-CoV-2 public health emergency.  Safety protocols were in place, including screening questions prior to the visit, additional usage of staff PPE, and extensive cleaning of exam room while observing appropriate contact time as indicated for disinfecting solutions.   Assessment & Plan:

## 2020-01-01 NOTE — Assessment & Plan Note (Signed)
Refilled arimidex to get her through to oncology visit. Stable overall.

## 2020-01-01 NOTE — Patient Instructions (Signed)
Health Maintenance, Female Adopting a healthy lifestyle and getting preventive care are important in promoting health and wellness. Ask your health care provider about:  The right schedule for you to have regular tests and exams.  Things you can do on your own to prevent diseases and keep yourself healthy. What should I know about diet, weight, and exercise? Eat a healthy diet   Eat a diet that includes plenty of vegetables, fruits, low-fat dairy products, and lean protein.  Do not eat a lot of foods that are high in solid fats, added sugars, or sodium. Maintain a healthy weight Body mass index (BMI) is used to identify weight problems. It estimates body fat based on height and weight. Your health care provider can help determine your BMI and help you achieve or maintain a healthy weight. Get regular exercise Get regular exercise. This is one of the most important things you can do for your health. Most adults should:  Exercise for at least 150 minutes each week. The exercise should increase your heart rate and make you sweat (moderate-intensity exercise).  Do strengthening exercises at least twice a week. This is in addition to the moderate-intensity exercise.  Spend less time sitting. Even light physical activity can be beneficial. Watch cholesterol and blood lipids Have your blood tested for lipids and cholesterol at 60 years of age, then have this test every 5 years. Have your cholesterol levels checked more often if:  Your lipid or cholesterol levels are high.  You are older than 60 years of age.  You are at high risk for heart disease. What should I know about cancer screening? Depending on your health history and family history, you may need to have cancer screening at various ages. This may include screening for:  Breast cancer.  Cervical cancer.  Colorectal cancer.  Skin cancer.  Lung cancer. What should I know about heart disease, diabetes, and high blood  pressure? Blood pressure and heart disease  High blood pressure causes heart disease and increases the risk of stroke. This is more likely to develop in people who have high blood pressure readings, are of African descent, or are overweight.  Have your blood pressure checked: ? Every 3-5 years if you are 18-39 years of age. ? Every year if you are 40 years old or older. Diabetes Have regular diabetes screenings. This checks your fasting blood sugar level. Have the screening done:  Once every three years after age 40 if you are at a normal weight and have a low risk for diabetes.  More often and at a younger age if you are overweight or have a high risk for diabetes. What should I know about preventing infection? Hepatitis B If you have a higher risk for hepatitis B, you should be screened for this virus. Talk with your health care provider to find out if you are at risk for hepatitis B infection. Hepatitis C Testing is recommended for:  Everyone born from 1945 through 1965.  Anyone with known risk factors for hepatitis C. Sexually transmitted infections (STIs)  Get screened for STIs, including gonorrhea and chlamydia, if: ? You are sexually active and are younger than 60 years of age. ? You are older than 60 years of age and your health care provider tells you that you are at risk for this type of infection. ? Your sexual activity has changed since you were last screened, and you are at increased risk for chlamydia or gonorrhea. Ask your health care provider if   you are at risk.  Ask your health care provider about whether you are at high risk for HIV. Your health care provider may recommend a prescription medicine to help prevent HIV infection. If you choose to take medicine to prevent HIV, you should first get tested for HIV. You should then be tested every 3 months for as long as you are taking the medicine. Pregnancy  If you are about to stop having your period (premenopausal) and  you may become pregnant, seek counseling before you get pregnant.  Take 400 to 800 micrograms (mcg) of folic acid every day if you become pregnant.  Ask for birth control (contraception) if you want to prevent pregnancy. Osteoporosis and menopause Osteoporosis is a disease in which the bones lose minerals and strength with aging. This can result in bone fractures. If you are 65 years old or older, or if you are at risk for osteoporosis and fractures, ask your health care provider if you should:  Be screened for bone loss.  Take a calcium or vitamin D supplement to lower your risk of fractures.  Be given hormone replacement therapy (HRT) to treat symptoms of menopause. Follow these instructions at home: Lifestyle  Do not use any products that contain nicotine or tobacco, such as cigarettes, e-cigarettes, and chewing tobacco. If you need help quitting, ask your health care provider.  Do not use street drugs.  Do not share needles.  Ask your health care provider for help if you need support or information about quitting drugs. Alcohol use  Do not drink alcohol if: ? Your health care provider tells you not to drink. ? You are pregnant, may be pregnant, or are planning to become pregnant.  If you drink alcohol: ? Limit how much you use to 0-1 drink a day. ? Limit intake if you are breastfeeding.  Be aware of how much alcohol is in your drink. In the [place] one drink equals one 12 oz bottle of beer (355 mL), one 5 oz glass of wine (148 mL), or one 1 oz glass of hard liquor (44 mL). General instructions  Schedule regular health, dental, and eye exams.  Stay current with your vaccines.  Tell your health care provider if: ? You often feel depressed. ? You have ever been abused or do not feel safe at home. Summary  Adopting a healthy lifestyle and getting preventive care are important in promoting health and wellness.  Follow your health care provider's instructions about healthy  diet, exercising, and getting tested or screened for diseases.  Follow your health care provider's instructions on monitoring your cholesterol and blood pressure. This information is not intended to replace advice given to you by your health care provider. Make sure you discuss any questions you have with your health care provider. Document Revised: 05/07/2018 Document Reviewed: 05/07/2018 Elsevier Patient Education  2020 Elsevier Inc.  

## 2020-01-01 NOTE — Assessment & Plan Note (Signed)
Flu shot yearly with work. Shingrix counseled. Tetanus up to date. Colonoscopy up to date. Mammogram up to date, pap smear not indicated and dexa not indicated. Counseled about sun safety and mole surveillance. Counseled about the dangers of distracted driving. Given 10 year screening recommendations.

## 2020-01-01 NOTE — Assessment & Plan Note (Signed)
Checking lipid panel. 

## 2020-01-03 ENCOUNTER — Encounter: Payer: Self-pay | Admitting: Internal Medicine

## 2020-01-03 LAB — SARS-COV-2 ANTIBODY, IGM: SARS-CoV-2 Antibody, IgM: NEGATIVE

## 2020-01-03 LAB — SPECIMEN STATUS REPORT

## 2020-01-05 LAB — COMPREHENSIVE METABOLIC PANEL
AG Ratio: 1.6 (calc) (ref 1.0–2.5)
ALT: 15 U/L (ref 6–29)
AST: 18 U/L (ref 10–35)
Albumin: 4.5 g/dL (ref 3.6–5.1)
Alkaline phosphatase (APISO): 93 U/L (ref 37–153)
BUN: 14 mg/dL (ref 7–25)
CO2: 27 mmol/L (ref 20–32)
Calcium: 10 mg/dL (ref 8.6–10.4)
Chloride: 103 mmol/L (ref 98–110)
Creat: 0.66 mg/dL (ref 0.50–1.05)
Globulin: 2.9 g/dL (calc) (ref 1.9–3.7)
Glucose, Bld: 79 mg/dL (ref 65–99)
Potassium: 4.3 mmol/L (ref 3.5–5.3)
Sodium: 141 mmol/L (ref 135–146)
Total Bilirubin: 0.5 mg/dL (ref 0.2–1.2)
Total Protein: 7.4 g/dL (ref 6.1–8.1)

## 2020-01-05 LAB — LIPID PANEL
Cholesterol: 292 mg/dL — ABNORMAL HIGH (ref <200)
HDL: 62 mg/dL (ref >=50)
LDL Cholesterol (Calc): 189 mg/dL (calc) — ABNORMAL HIGH
Non-HDL Cholesterol (Calc): 230 mg/dL (calc) — ABNORMAL HIGH (ref <130)
Total CHOL/HDL Ratio: 4.7 (calc) (ref <5.0)
Triglycerides: 217 mg/dL — ABNORMAL HIGH (ref <150)

## 2020-01-05 LAB — HEMOGLOBIN A1C
Hgb A1c MFr Bld: 5.7 % of total Hgb — ABNORMAL HIGH (ref <5.7)
Mean Plasma Glucose: 117 (calc)
eAG (mmol/L): 6.5 (calc)

## 2020-03-04 ENCOUNTER — Telehealth: Payer: Self-pay | Admitting: Internal Medicine

## 2020-03-04 NOTE — Telephone Encounter (Signed)
Patient called regarding a bill from [employer] and she states she has called about this issue several times. She has talked to billing and they told her to call the office and we have apparently told her to call [facility] and they told her to call us. She said that she spoke to [place] about sending the issue to practice admin and she said that she has not heard anything regarding the matter. She can be reached at [phone]

## 2020-04-27 ENCOUNTER — Telehealth: Payer: Self-pay | Admitting: Hematology and Oncology

## 2020-04-27 NOTE — Telephone Encounter (Signed)
Called pt per 11/30 sch msg - no answer. Left message for patient to call back to reschedule

## 2020-05-01 NOTE — Progress Notes (Signed)
Patient Care Team: [name], MD as PCP - General (Internal Medicine) Delsa Bern, MD (Obstetrics and Gynecology) Sharyne Peach, MD (Ophthalmology) Juanita Craver, MD (Gastroenterology) Erroll Luna, MD as Consulting Physician (General Surgery) Nicholas Lose, MD as Consulting Physician (Hematology and Oncology) Kyung Rudd, MD as Consulting Physician (Radiation Oncology) Gardenia Phlegm, NP as Nurse Practitioner (Hematology and Oncology)  DIAGNOSIS:    ICD-10-CM   1. 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 EF 70%, PR 20%, Ki-67 5%, HER-2 negative, ratio 1.17; screening call back for distortion, ultrasound 1 x 0.8 x 0.8 cm, T1b N0 stage IA clinical stage   02/02/2016 Surgery   Right mastectomy: Invasive lobular cancer grade 1, 2.5 cm, involves nipple submucosa, LVI present, LCIS, margins negative, 0/1 lymph node, T2 N0 stage II a, ER 70%, PR 20%, HER-2 negative, Ki-67 5% , Oncotype DX score 26, 17% ROR intermediate risk   03/08/2016 -  Anti-estrogen oral therapy   Anastrozole 1 mg daily     CHIEF COMPLIANT: Follow-up of right breast cancer on anastrozole therapy  INTERVAL HISTORY: Cathy Woodward is a 59 y.o. with above-mentioned history of right breast cancer treated with mastectomy and who is currently on anastrozole therapy. Mammogram on 08/28/19 showed no evidence of malignancy in the left breast. She presents to the clinic today for annual follow-up.   She has been tolerating anastrozole extremely well without any problems or concerns.  She has completed 4 years of therapy. She did develop Covid infection but has recovered from it.  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  ALLERGIES:  is allergic to doxycycline, erythromycin, minocin [minocycline hcl], aspirin, lansoprazole, latex, nickel, and vitamin e.  MEDICATIONS:  Current Outpatient Medications  Medication Sig Dispense Refill  . anastrozole (ARIMIDEX) 1 MG tablet Take 1 tablet (1 mg total) by mouth daily. 90 tablet 0  . Ascorbic Acid (VITAMIN C) 500 MG tablet Take 500 mg by mouth daily.      . cholecalciferol (VITAMIN D) 1000 units tablet Take 1 tablet (1,000 Units total) by mouth daily.    . famotidine (PEPCID) 20 MG tablet Take 20 mg by mouth 2 (two) times daily.    . fluticasone (FLONASE) 50 MCG/ACT nasal spray Place 2 sprays into the nose daily. 48 g 3  . loratadine (CLARITIN) 10 MG tablet Take 10 mg by mouth daily.    . Multiple Vitamin (MULTIVITAMIN) tablet Take 1 tablet by mouth daily.    . naproxen sodium (ANAPROX) 220 MG tablet Take 440 mg by mouth 2 (two) times daily as needed (pain).    Marland Kitchen scopolamine (TRANSDERM-SCOP, 1.5 MG,) 1 MG/3DAYS Place 1 patch (1.5 mg total) onto the skin every 3 (three) days. 2 patch 0  . sodium chloride (OCEAN) 0.65 % SOLN nasal spray Place 2 sprays into both nostrils daily.     Marland Kitchen triamterene-hydrochlorothiazide (MAXZIDE-25) 37.5-25 MG tablet Take 1 tablet by mouth 3 (three) times a week.     . vitamin E 400 UNIT capsule Take 400 Units by mouth daily.  No current facility-administered medications for this visit.    PHYSICAL EXAMINATION: ECOG PERFORMANCE STATUS: 1 - Symptomatic but completely ambulatory  Vitals:   05/02/20 1514  BP: 114/62  Pulse: 80  Resp: 19  Temp: 99.2 F (37.3 C)  SpO2: 100%   Filed Weights   05/02/20 1514  Weight: 115 lb 3.2 oz (52.3 kg)    BREAST: No palpable masses or nodules (exam performed in the presence of a chaperone)  LABORATORY DATA:  I have reviewed the data as listed CMP  Latest Ref Rng & Units 01/01/2020 01/19/2019 01/01/2018  Glucose 65 - 99 mg/dL 79 93 90  BUN 7 - 25 mg/dL $Remove'14 17 16  'BVkptVP$ Creatinine 0.50 - 1.05 mg/dL 0.66 0.78 0.80  Sodium 135 - 146 mmol/L 141 138 140  Potassium 3.5 - 5.3 mmol/L 4.3 3.9 4.3  Chloride 98 - 110 mmol/L 103 102 102  CO2 20 - 32 mmol/L $RemoveB'27 26 31  'bJacQASq$ Calcium 8.6 - 10.4 mg/dL 10.0 10.0 10.4  Total Protein 6.1 - 8.1 g/dL 7.4 7.7 7.8  Total Bilirubin 0.2 - 1.2 mg/dL 0.5 0.4 0.8  Alkaline Phos 39 - 117 U/L - 90 96  AST 10 - 35 U/L $Remo'18 19 21  'pWiRC$ ALT 6 - 29 U/L $Remo'15 15 20    'zRlTU$ Lab Results  Component Value Date   WBC 6.5 01/01/2020   HGB 13.7 01/01/2020   HCT 42.4 01/01/2020   MCV 79.1 (L) 01/01/2020   PLT 234 01/01/2020   NEUTROABS 3,198 01/01/2020    ASSESSMENT & PLAN:  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 ([place]) Right mastectomy 02/02/2016: Invasive lobular cancer grade 1, 2.5 cm, involves nipple submucosa, LVI present, LCIS, margins negative, 0/1 lymph node, T2 N0 stage II a, ER 70%, PR 20%, HER-2 negative, Ki-67 5%   Oncotype DX score26, intermediate risk, 17% risk of recurrence with tamoxifen alone (Offered adjuvant chemo with TC: Patient refused)  Current Treatment: Anastrozole 1 mg daily started 03/08/16  Anastrozole Toxicities: Occasional hot flashes Slight tenosynovitis of the left wrist: Resolved with lemon peel soaked in olive oil  Breast cancer surveillance: 1.Breast exam12/10/2019: Benign 2.annual mammograms left breast4/06/2019: Benign, breast density category C 3. breast MRIs10/5/20: No evidence of malignancy Plan to do breast MRIs every other year. Will obtain MRI breast in Oct 2022  She wanted a waiver for COVID-19 vaccine.  Unfortunately there are no grounds for waiver in her situation medically speaking.  She is a fantastic physician and I hope that she is able to continue her work as an Administrator, [employer] in spite of the vaccine.  Return to clinic in 1 year for follow-up    No orders of the defined  types were placed in this encounter.  The patient has a good understanding of the overall plan. she agrees with it. she will call with any problems that may develop before the next visit here.  Total time spent: 20 mins including face to face time and time spent for planning, charting and coordination of care  Nicholas Lose, MD 05/02/2020  I, Cloyde Reams Dorshimer, am acting as scribe for Dr. Nicholas Lose.  I have reviewed the above documentation for accuracy and completeness, and I agree with the above.

## 2020-05-02 ENCOUNTER — Other Ambulatory Visit: Payer: Self-pay

## 2020-05-02 ENCOUNTER — Inpatient Hospital Stay: Payer: Self-pay | Attending: Hematology and Oncology | Admitting: Hematology and Oncology

## 2020-05-02 DIAGNOSIS — Z79811 Long term (current) use of aromatase inhibitors: Secondary | ICD-10-CM | POA: Insufficient documentation

## 2020-05-02 DIAGNOSIS — Z170 Estrogen receptor positive status [ER+]: Secondary | ICD-10-CM | POA: Insufficient documentation

## 2020-05-02 DIAGNOSIS — M65832 Other synovitis and tenosynovitis, left forearm: Secondary | ICD-10-CM | POA: Insufficient documentation

## 2020-05-02 DIAGNOSIS — C50111 Malignant neoplasm of central portion of right female breast: Secondary | ICD-10-CM | POA: Insufficient documentation

## 2020-05-02 DIAGNOSIS — Z9011 Acquired absence of right breast and nipple: Secondary | ICD-10-CM | POA: Insufficient documentation

## 2020-05-02 DIAGNOSIS — Z79899 Other long term (current) drug therapy: Secondary | ICD-10-CM | POA: Insufficient documentation

## 2020-05-02 DIAGNOSIS — R232 Flushing: Secondary | ICD-10-CM | POA: Insufficient documentation

## 2020-05-02 DIAGNOSIS — Z886 Allergy status to analgesic agent status: Secondary | ICD-10-CM | POA: Insufficient documentation

## 2020-05-02 DIAGNOSIS — Z881 Allergy status to other antibiotic agents status: Secondary | ICD-10-CM | POA: Insufficient documentation

## 2020-05-02 DIAGNOSIS — Z8616 Personal history of COVID-19: Secondary | ICD-10-CM | POA: Insufficient documentation

## 2020-05-02 MED ORDER — VITAMIN D 50 MCG (2000 UT) PO CAPS: 1.0000 | ORAL_CAPSULE | Freq: Every day | ORAL | Status: AC

## 2020-05-02 MED ORDER — ANASTROZOLE 1 MG PO TABS: 1.0000 mg | ORAL_TABLET | Freq: Every day | ORAL | 3 refills | Status: DC

## 2020-05-02 NOTE — Assessment & Plan Note (Signed)
Right mastectomy 02/02/2016: Invasive lobular cancer grade 1, 2.5 cm, involves nipple submucosa, LVI present, LCIS, margins negative, 0/1 lymph node, T2 N0 stage II a, ER 70%, PR 20%, HER-2 negative, Ki-67 5%   Oncotype DX score26, intermediate risk, 17% risk of recurrence with tamoxifen alone (Offered adjuvant chemo with TC: Patient refused)  Current Treatment: Anastrozole 1 mg daily started 03/08/16  Anastrozole Toxicities: Occasional hot flashes Slight tenosynovitis of the left wrist: Resolved with lemon peel soaked in olive oil  Breast cancer surveillance: 1.Breast exam12/10/2019: Benign 2.annual mammograms left breast4/06/2019: Benign, breast density category C 3. breast MRIs10/5/20: No evidence of malignancy Plan to do breast MRIs every other year.   Return to clinic in 1 year for follow-up

## 2020-05-04 ENCOUNTER — Ambulatory Visit: Payer: No Typology Code available for payment source | Admitting: Hematology and Oncology

## 2020-05-10 ENCOUNTER — Ambulatory Visit: Payer: Self-pay | Admitting: Hematology and Oncology

## 2020-10-19 ENCOUNTER — Other Ambulatory Visit: Payer: Self-pay | Admitting: Hematology and Oncology

## 2020-10-19 DIAGNOSIS — Z1231 Encounter for screening mammogram for malignant neoplasm of breast: Secondary | ICD-10-CM

## 2020-10-19 DIAGNOSIS — Z9011 Acquired absence of right breast and nipple: Secondary | ICD-10-CM

## 2020-10-27 ENCOUNTER — Telehealth: Payer: Self-pay | Admitting: Hematology and Oncology

## 2020-10-27 NOTE — Telephone Encounter (Signed)
R/s per 12/6 los, pt aware.

## 2020-12-16 ENCOUNTER — Other Ambulatory Visit: Payer: Self-pay

## 2020-12-16 ENCOUNTER — Ambulatory Visit
Admission: RE | Admit: 2020-12-16 | Discharge: 2020-12-16 | Disposition: A | Discharge status: Home / Self Care | Payer: Self-pay | Source: Ambulatory Visit | Attending: Hematology and Oncology | Admitting: Hematology and Oncology

## 2020-12-16 DIAGNOSIS — Z1231 Encounter for screening mammogram for malignant neoplasm of breast: Secondary | ICD-10-CM

## 2020-12-16 DIAGNOSIS — Z9011 Acquired absence of right breast and nipple: Secondary | ICD-10-CM

## 2021-03-21 ENCOUNTER — Encounter: Payer: Self-pay | Admitting: Internal Medicine

## 2021-03-21 DIAGNOSIS — E782 Mixed hyperlipidemia: Secondary | ICD-10-CM

## 2021-03-24 ENCOUNTER — Other Ambulatory Visit (INDEPENDENT_AMBULATORY_CARE_PROVIDER_SITE_OTHER): Payer: Self-pay

## 2021-03-24 DIAGNOSIS — E782 Mixed hyperlipidemia: Secondary | ICD-10-CM

## 2021-03-24 LAB — LIPID PANEL
Cholesterol: 233 mg/dL — ABNORMAL HIGH (ref 0–200)
HDL: 66.6 mg/dL (ref >39.00)
LDL Cholesterol: 148 mg/dL — ABNORMAL HIGH (ref 0–99)
NonHDL: 166.33
Total CHOL/HDL Ratio: 3
Triglycerides: 90 mg/dL (ref 0.0–149.0)
VLDL: 18 mg/dL (ref 0.0–40.0)

## 2021-03-24 LAB — COMPREHENSIVE METABOLIC PANEL
ALT: 18 U/L (ref 0–35)
AST: 21 U/L (ref 0–37)
Albumin: 4.5 g/dL (ref 3.5–5.2)
Alkaline Phosphatase: 80 U/L (ref 39–117)
BUN: 22 mg/dL (ref 6–23)
CO2: 28 mEq/L (ref 19–32)
Calcium: 9.7 mg/dL (ref 8.4–10.5)
Chloride: 103 mEq/L (ref 96–112)
Creatinine, Ser: 0.78 mg/dL (ref 0.40–1.20)
GFR: 82.3 mL/min (ref >60.00)
Glucose, Bld: 84 mg/dL (ref 70–99)
Potassium: 3.8 mEq/L (ref 3.5–5.1)
Sodium: 139 mEq/L (ref 135–145)
Total Bilirubin: 0.7 mg/dL (ref 0.2–1.2)
Total Protein: 7.6 g/dL (ref 6.0–8.3)

## 2021-03-24 LAB — CBC
HCT: 40.3 % (ref 36.0–46.0)
Hemoglobin: 13 g/dL (ref 12.0–15.0)
MCHC: 32.2 g/dL (ref 30.0–36.0)
MCV: 79.3 fl (ref 78.0–100.0)
Platelets: 210 10*3/uL (ref 150.0–400.0)
RBC: 5.09 Mil/uL (ref 3.87–5.11)
RDW: 14.5 % (ref 11.5–15.5)
WBC: 7.1 10*3/uL (ref 4.0–10.5)

## 2021-03-24 LAB — HEMOGLOBIN A1C: Hgb A1c MFr Bld: 6.1 % (ref 4.6–6.5)

## 2021-04-07 ENCOUNTER — Encounter: Payer: No Typology Code available for payment source | Admitting: Internal Medicine

## 2021-04-22 ENCOUNTER — Other Ambulatory Visit: Payer: Self-pay

## 2021-04-22 ENCOUNTER — Ambulatory Visit
Admission: RE | Admit: 2021-04-22 | Discharge: 2021-04-22 | Disposition: A | Discharge status: Home / Self Care | Payer: No Typology Code available for payment source | Source: Ambulatory Visit | Attending: Hematology and Oncology | Admitting: Hematology and Oncology

## 2021-04-22 DIAGNOSIS — C50111 Malignant neoplasm of central portion of right female breast: Secondary | ICD-10-CM

## 2021-04-22 MED ORDER — GADOBUTROL 1 MMOL/ML IV SOLN
5.0000 mL | Freq: Once | INTRAVENOUS | Status: AC | PRN
  Administered 2021-04-22: 5 mL via INTRAVENOUS

## 2021-04-24 ENCOUNTER — Other Ambulatory Visit: Payer: Self-pay | Admitting: *Deleted

## 2021-04-24 DIAGNOSIS — C50111 Malignant neoplasm of central portion of right female breast: Secondary | ICD-10-CM

## 2021-04-24 MED ORDER — ANASTROZOLE 1 MG PO TABS: 1.0000 mg | ORAL_TABLET | Freq: Every day | ORAL | 0 refills | Status: DC

## 2021-04-24 NOTE — Progress Notes (Signed)
Received breast MRI results with recommendations of diagnostic mammogram and ultrasound of the left breast for further evaluation of left breast pain with negative finding on recent breast MRI. Verbal orders received by MD. RN scheduled appt and called pt to confirm date and time.  Pt states she does not want to proceed with mammogram and [place] at this time.  Pt states pain is intermittent and not constant. Pt educated to call our office if she wishes to proceed with additional imaging.

## 2021-04-28 ENCOUNTER — Encounter: Payer: Self-pay | Admitting: Internal Medicine

## 2021-04-28 ENCOUNTER — Ambulatory Visit (INDEPENDENT_AMBULATORY_CARE_PROVIDER_SITE_OTHER): Payer: Self-pay | Admitting: Internal Medicine

## 2021-04-28 ENCOUNTER — Other Ambulatory Visit: Payer: Self-pay

## 2021-04-28 VITALS — BP 110/62 | HR 80 | Resp 18 | Ht 67.0 in | Wt 110.4 lb

## 2021-04-28 DIAGNOSIS — C50111 Malignant neoplasm of central portion of right female breast: Secondary | ICD-10-CM

## 2021-04-28 DIAGNOSIS — R7303 Prediabetes: Secondary | ICD-10-CM

## 2021-04-28 DIAGNOSIS — E782 Mixed hyperlipidemia: Secondary | ICD-10-CM

## 2021-04-28 DIAGNOSIS — Z0000 Encounter for general adult medical examination without abnormal findings: Secondary | ICD-10-CM

## 2021-04-28 DIAGNOSIS — I059 Rheumatic mitral valve disease, unspecified: Secondary | ICD-10-CM

## 2021-04-28 DIAGNOSIS — Z170 Estrogen receptor positive status [ER+]: Secondary | ICD-10-CM

## 2021-04-28 LAB — TSH: TSH: 1.1 u[IU]/mL (ref 0.35–5.50)

## 2021-04-28 LAB — VITAMIN D 25 HYDROXY (VIT D DEFICIENCY, FRACTURES): VITD: 36.73 ng/mL (ref 30.00–100.00)

## 2021-04-28 NOTE — Progress Notes (Signed)
   Subjective:   Patient ID: Cathy Woodward, female    DOB: 1961-03-26, 60 y.o.   MRN: [id number]  HPI The patient is a 60 YO female coming in for physical.   PMH, [place], social history reviewed and updated  Review of Systems  Constitutional: Negative.   HENT: Negative.    Eyes: Negative.   Respiratory:  Negative for cough, chest tightness and shortness of breath.   Cardiovascular:  Negative for chest pain, palpitations and leg swelling.  Gastrointestinal:  Negative for abdominal distention, abdominal pain, constipation, diarrhea, nausea and vomiting.  Musculoskeletal: Negative.   Skin: Negative.   Neurological: Negative.   Psychiatric/Behavioral: Negative.     Objective:  Physical Exam Constitutional:      Appearance: She is well-developed.     Comments: thin  HENT:     Head: Normocephalic and atraumatic.  Cardiovascular:     Rate and Rhythm: Normal rate and regular rhythm.  Pulmonary:     Effort: Pulmonary effort is normal. No respiratory distress.     Breath sounds: Normal breath sounds. No wheezing or rales.  Abdominal:     General: Bowel sounds are normal. There is no distension.     Palpations: Abdomen is soft.     Tenderness: There is no abdominal tenderness. There is no rebound.  Musculoskeletal:     Cervical back: Normal range of motion.  Skin:    General: Skin is warm and dry.  Neurological:     Mental Status: She is alert and oriented to person, place, and time.     Coordination: Coordination normal.    Vitals:   04/28/21 1338  BP: 110/62  Pulse: 80  Resp: 18  SpO2: 98%  Weight: 110 lb 6.4 oz (50.1 kg)  Height: 5\' 7"  (1.702 m)    This visit occurred during the SARS-CoV-2 public health emergency.  Safety protocols were in place, including screening questions prior to the visit, additional usage of staff PPE, and extensive cleaning of exam room while observing appropriate contact time as indicated for disinfecting solutions.   Assessment & Plan:

## 2021-04-28 NOTE — Assessment & Plan Note (Signed)
Reviewed echo from 2016 and we have tentatively planned for repeat echo 2023. No new symptoms.

## 2021-04-28 NOTE — Assessment & Plan Note (Signed)
Recent MRI reviewed with patient normal. 3d Mammogram normal July 2022. Taking arimidex 1 mg daily.

## 2021-04-28 NOTE — Assessment & Plan Note (Signed)
Strong paternal family history and we monitor Hga1c yearly.

## 2021-04-28 NOTE — Assessment & Plan Note (Signed)
Overall stable with recent labs.

## 2021-04-28 NOTE — Assessment & Plan Note (Signed)
Flu shot declines. Covid-19 counseled. Shingrix counseled. Tetanus up to date. Colonoscopy up to date. Mammogram up to date, pap smear up to date and dexa up to date. Counseled about sun safety and mole surveillance. Counseled about the dangers of distracted driving. Given 10 year screening recommendations.

## 2021-05-02 ENCOUNTER — Ambulatory Visit: Payer: Self-pay | Admitting: Hematology and Oncology

## 2021-05-04 ENCOUNTER — Other Ambulatory Visit: Payer: No Typology Code available for payment source

## 2021-05-09 ENCOUNTER — Ambulatory Visit: Payer: Self-pay | Admitting: Hematology and Oncology

## 2021-05-11 NOTE — Progress Notes (Signed)
Patient Care Team: [name], MD as PCP - General (Internal Medicine) Delsa Bern, MD (Obstetrics and Gynecology) Sharyne Peach, MD (Ophthalmology) Juanita Craver, MD (Gastroenterology) Erroll Luna, MD as Consulting Physician (General Surgery) Nicholas Lose, MD as Consulting Physician (Hematology and Oncology) Kyung Rudd, MD as Consulting Physician (Radiation Oncology) Gardenia Phlegm, NP as Nurse Practitioner (Hematology and Oncology)  DIAGNOSIS:    ICD-10-CM   1. Malignant neoplasm of central portion of right breast in female, estrogen receptor positive ([place])  C50.111    Z17.0       SUMMARY OF ONCOLOGIC HISTORY: Oncology History  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 ([place])  12/28/2015 Initial Diagnosis   Right breast biopsy retroareolar: Invasive lobular cancer with LCIS grade 1 EF 70%, PR 20%, Ki-67 5%, HER-2 negative, ratio 1.17; screening call back for distortion, ultrasound 1 x 0.8 x 0.8 cm, T1b N0 stage IA clinical stage   02/02/2016 Surgery   Right mastectomy: Invasive lobular cancer grade 1, 2.5 cm, involves nipple submucosa, LVI present, LCIS, margins negative, 0/1 lymph node, T2 N0 stage II a, ER 70%, PR 20%, HER-2 negative, Ki-67 5% , Oncotype DX score 26, 17% ROR intermediate risk   03/08/2016 -  Anti-estrogen oral therapy   Anastrozole 1 mg daily     CHIEF COMPLIANT: Follow-up of right breast cancer on anastrozole therapy  INTERVAL HISTORY: Cathy Woodward is a 60 y.o. with above-mentioned history of  right breast cancer treated with mastectomy and who is currently on anastrozole therapy. Mammogram on 10/16/2020 showed no evidence of malignancy in the left breast. MRI Breast on 04/22/2021 showed no evidence of malignancy. She presents to the clinic today for annual follow-up.  She reports mild joint stiffness especially in the knees but otherwise she is doing very well.  ALLERGIES:  is allergic to doxycycline, erythromycin,  minocin [minocycline hcl], aspirin, lansoprazole, latex, nickel, and vitamin e.  MEDICATIONS:  Current Outpatient Medications  Medication Sig Dispense Refill   anastrozole (ARIMIDEX) 1 MG tablet Take 1 tablet (1 mg total) by mouth daily. 90 tablet 0   Ascorbic Acid (VITAMIN C) 500 MG tablet Take 500 mg by mouth daily.       Cholecalciferol (VITAMIN D) 50 MCG (2000 UT) CAPS Take 1 capsule (2,000 Units total) by mouth daily. 30 capsule    famotidine (PEPCID) 20 MG tablet Take 20 mg by mouth 2 (two) times daily.     fluticasone (FLONASE) 50 MCG/ACT nasal spray Place 2 sprays into the nose daily. 48 g 3   Multiple Vitamin (MULTIVITAMIN) tablet Take 1 tablet by mouth daily.     naproxen sodium (ANAPROX) 220 MG tablet Take 440 mg by mouth 2 (two) times daily as needed (pain).     sodium chloride (OCEAN) 0.65 % SOLN nasal spray Place 2 sprays into both nostrils daily.     triamterene-hydrochlorothiazide (MAXZIDE-25) 37.5-25 MG tablet Take 1 tablet by mouth 3 (three) times a week.  (Patient not taking: Reported on 04/28/2021)     No current facility-administered medications for this visit.    PHYSICAL EXAMINATION: ECOG PERFORMANCE STATUS: 1 - Symptomatic but completely ambulatory  Vitals:   05/12/21 0927  BP: 109/64  Pulse: 98  Resp: 18  Temp: 97.9 F (36.6 C)  SpO2: 100%   Filed Weights   05/12/21 0927  Weight: 108 lb 4.8 oz (49.1 kg)    BREAST: Right mastectomy with reconstruction.  No palpable lumps or nodules (exam performed in the presence of  a chaperone)  LABORATORY DATA:  I have reviewed the data as listed CMP Latest Ref Rng & Units 03/24/2021 01/01/2020 01/19/2019  Glucose 70 - 99 mg/dL 84 79 93  BUN 6 - 23 mg/dL $Remove'22 14 17  'LApwbvC$ Creatinine 0.40 - 1.20 mg/dL 0.78 0.66 0.78  Sodium 135 - 145 mEq/L 139 141 138  Potassium 3.5 - 5.1 mEq/L 3.8 4.3 3.9  Chloride 96 - 112 mEq/L 103 103 102  CO2 19 - 32 mEq/L $Remove'28 27 26  'Eqwvhrc$ Calcium 8.4 - 10.5 mg/dL 9.7 10.0 10.0  Total Protein 6.0 - 8.3 g/dL  7.6 7.4 7.7  Total Bilirubin 0.2 - 1.2 mg/dL 0.7 0.5 0.4  Alkaline Phos 39 - 117 U/L 80 - 90  AST 0 - 37 U/L $Remo'21 18 19  'PczEt$ ALT 0 - 35 U/L $Remo'18 15 15    'XBrOm$ Lab Results  Component Value Date   WBC 7.1 03/24/2021   HGB 13.0 03/24/2021   HCT 40.3 03/24/2021   MCV 79.3 03/24/2021   PLT 210.0 03/24/2021   NEUTROABS 3,198 01/01/2020    ASSESSMENT & PLAN:  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 ([place]) Right mastectomy 02/02/2016: Invasive lobular cancer grade 1, 2.5 cm, involves nipple submucosa, LVI present, LCIS, margins negative, 0/1 lymph node, T2 N0 stage II a, ER 70%, PR 20%, HER-2 negative, Ki-67 5%    Oncotype DX score 26, intermediate risk, 17% risk of recurrence with tamoxifen alone (Offered adjuvant chemo with TC: Patient refused)   Current Treatment: Anastrozole 1 mg daily started 03/08/16   Anastrozole Toxicities: Occasional hot flashes Slight tenosynovitis of the left wrist: Resolved with lemon peel soaked in olive oil   Breast cancer surveillance:  1. Breast exam 05/12/2021: Benign 2. annual mammograms left breast 12/20/2020: Benign, breast density category C 3. breast MRIs 04/24/2021: No evidence of malignancy in the left breast to explain the cause of the pain Plan to do breast MRIs every other year. Will obtain MRI breast in Oct 2024   Return to clinic in 1 year for follow-up    No orders of the defined types were placed in this encounter.  The patient has a good understanding of the overall plan. she agrees with it. she will call with any problems that may develop before the next visit here.  Total time spent: 20 mins including face to face time and time spent for planning, charting and coordination of care  Rulon Eisenmenger, MD, MPH 05/12/2021  I, Thana Ates, am acting as scribe for Dr. Nicholas Lose.  I have reviewed the above documentation for accuracy and completeness, and I agree with the above.

## 2021-05-11 NOTE — Assessment & Plan Note (Signed)
Right mastectomy 02/02/2016: Invasive lobular cancer grade 1, 2.5 cm, involves nipple submucosa, LVI present, LCIS, margins negative, 0/1 lymph node, T2 N0 stage II a, ER 70%, PR 20%, HER-2 negative, Ki-67 5%  °  °Oncotype DX score 26, intermediate risk, 17% risk of recurrence with tamoxifen alone °(Offered adjuvant chemo with TC: Patient refused) °  °Current Treatment: Anastrozole 1 mg daily started 03/08/16 °  °Anastrozole Toxicities: °Occasional hot flashes °Slight tenosynovitis of the left wrist: Resolved with lemon peel soaked in olive oil °  °Breast cancer surveillance:  °1. Breast exam 05/12/2021: Benign °2. annual mammograms left breast 12/20/2020: Benign, breast density category C °3. breast MRIs 04/24/2021: No evidence of malignancy in the left breast to explain the cause of the pain °Plan to do breast MRIs every other year. Will obtain MRI breast in Oct 2024 °  °Return to clinic in 1 year for follow-up °

## 2021-05-12 ENCOUNTER — Other Ambulatory Visit: Payer: Self-pay

## 2021-05-12 ENCOUNTER — Inpatient Hospital Stay: Payer: Self-pay | Attending: Hematology and Oncology | Admitting: Hematology and Oncology

## 2021-05-12 DIAGNOSIS — Z886 Allergy status to analgesic agent status: Secondary | ICD-10-CM | POA: Insufficient documentation

## 2021-05-12 DIAGNOSIS — Z79899 Other long term (current) drug therapy: Secondary | ICD-10-CM | POA: Insufficient documentation

## 2021-05-12 DIAGNOSIS — Z881 Allergy status to other antibiotic agents status: Secondary | ICD-10-CM | POA: Insufficient documentation

## 2021-05-12 DIAGNOSIS — Z79811 Long term (current) use of aromatase inhibitors: Secondary | ICD-10-CM | POA: Insufficient documentation

## 2021-05-12 DIAGNOSIS — Z170 Estrogen receptor positive status [ER+]: Secondary | ICD-10-CM | POA: Insufficient documentation

## 2021-05-12 DIAGNOSIS — M2560 Stiffness of unspecified joint, not elsewhere classified: Secondary | ICD-10-CM | POA: Insufficient documentation

## 2021-05-12 DIAGNOSIS — Z9011 Acquired absence of right breast and nipple: Secondary | ICD-10-CM | POA: Insufficient documentation

## 2021-05-12 DIAGNOSIS — C50111 Malignant neoplasm of central portion of right female breast: Secondary | ICD-10-CM | POA: Insufficient documentation

## 2021-05-12 DIAGNOSIS — R232 Flushing: Secondary | ICD-10-CM | POA: Insufficient documentation

## 2021-05-12 MED ORDER — ANASTROZOLE 1 MG PO TABS: 1.0000 mg | ORAL_TABLET | Freq: Every day | ORAL | 4 refills | Status: DC

## 2021-10-13 ENCOUNTER — Other Ambulatory Visit: Payer: Self-pay | Admitting: Hematology and Oncology

## 2021-10-13 DIAGNOSIS — Z1231 Encounter for screening mammogram for malignant neoplasm of breast: Secondary | ICD-10-CM

## 2021-10-27 ENCOUNTER — Ambulatory Visit: Payer: No Typology Code available for payment source

## 2021-12-01 ENCOUNTER — Ambulatory Visit
Admission: RE | Admit: 2021-12-01 | Discharge: 2021-12-01 | Disposition: A | Discharge status: Home / Self Care | Payer: No Typology Code available for payment source | Source: Ambulatory Visit | Attending: Hematology and Oncology | Admitting: Hematology and Oncology

## 2021-12-01 DIAGNOSIS — Z1231 Encounter for screening mammogram for malignant neoplasm of breast: Secondary | ICD-10-CM

## 2022-02-16 ENCOUNTER — Other Ambulatory Visit: Payer: Self-pay

## 2022-02-16 LAB — HM COLONOSCOPY

## 2022-02-20 ENCOUNTER — Encounter: Payer: Self-pay | Admitting: Hematology and Oncology

## 2022-03-09 ENCOUNTER — Encounter: Payer: Self-pay | Admitting: Internal Medicine

## 2022-03-09 ENCOUNTER — Ambulatory Visit (INDEPENDENT_AMBULATORY_CARE_PROVIDER_SITE_OTHER): Payer: Self-pay | Admitting: Internal Medicine

## 2022-03-09 VITALS — BP 102/68 | HR 75 | Temp 98.2°F | Ht 67.0 in | Wt 108.4 lb

## 2022-03-09 DIAGNOSIS — E782 Mixed hyperlipidemia: Secondary | ICD-10-CM

## 2022-03-09 DIAGNOSIS — R7303 Prediabetes: Secondary | ICD-10-CM

## 2022-03-09 NOTE — Assessment & Plan Note (Signed)
Checking lipid panel and adjust as needed diet controlled currently.

## 2022-03-09 NOTE — Progress Notes (Signed)
   Subjective:   Patient ID: Cathy Woodward, female    DOB: [birth year], 61 y.o.   MRN: [id number]  HPI The patient is a 61 YO female coming in for yearly follow up.   Review of Systems  Constitutional: Negative.   HENT: Negative.    Eyes: Negative.   Respiratory:  Negative for cough, chest tightness and shortness of breath.   Cardiovascular:  Negative for chest pain, palpitations and leg swelling.  Gastrointestinal:  Negative for abdominal distention, abdominal pain, constipation, diarrhea, nausea and vomiting.  Musculoskeletal: Negative.   Skin: Negative.   Neurological: Negative.   Psychiatric/Behavioral: Negative.      Objective:  Physical Exam Constitutional:      Appearance: She is well-developed.  HENT:     Head: Normocephalic and atraumatic.  Cardiovascular:     Rate and Rhythm: Normal rate and regular rhythm.  Pulmonary:     Effort: Pulmonary effort is normal. No respiratory distress.     Breath sounds: Normal breath sounds. No wheezing or rales.  Abdominal:     General: Bowel sounds are normal. There is no distension.     Palpations: Abdomen is soft.     Tenderness: There is no abdominal tenderness. There is no rebound.  Musculoskeletal:     Cervical back: Normal range of motion.  Skin:    General: Skin is warm and dry.  Neurological:     Mental Status: She is alert and oriented to person, place, and time.     Coordination: Coordination normal.     Vitals:   03/09/22 1425  BP: 102/68  Pulse: 75  Temp: 98.2 F (36.8 C)  TempSrc: Oral  SpO2: 98%  Weight: 108 lb 6.4 oz (49.2 kg)  Height: '5\' 7"'$  (1.702 m)    Assessment & Plan:

## 2022-03-09 NOTE — Assessment & Plan Note (Signed)
Checking HgA1c and adjust as needed.  

## 2022-05-04 ENCOUNTER — Encounter: Payer: Self-pay | Admitting: Internal Medicine

## 2022-05-04 ENCOUNTER — Other Ambulatory Visit (INDEPENDENT_AMBULATORY_CARE_PROVIDER_SITE_OTHER): Payer: Self-pay

## 2022-05-04 DIAGNOSIS — E782 Mixed hyperlipidemia: Secondary | ICD-10-CM

## 2022-05-04 DIAGNOSIS — R7303 Prediabetes: Secondary | ICD-10-CM

## 2022-05-04 LAB — COMPREHENSIVE METABOLIC PANEL
ALT: 19 U/L (ref 0–35)
AST: 20 U/L (ref 0–37)
Albumin: 4.5 g/dL (ref 3.5–5.2)
Alkaline Phosphatase: 81 U/L (ref 39–117)
BUN: 16 mg/dL (ref 6–23)
CO2: 29 mEq/L (ref 19–32)
Calcium: 10 mg/dL (ref 8.4–10.5)
Chloride: 102 mEq/L (ref 96–112)
Creatinine, Ser: 0.81 mg/dL (ref 0.40–1.20)
GFR: 78.04 mL/min (ref >60.00)
Glucose, Bld: 86 mg/dL (ref 70–99)
Potassium: 3.7 mEq/L (ref 3.5–5.1)
Sodium: 140 mEq/L (ref 135–145)
Total Bilirubin: 0.5 mg/dL (ref 0.2–1.2)
Total Protein: 7.9 g/dL (ref 6.0–8.3)

## 2022-05-04 LAB — CBC
HCT: 40.2 % (ref 36.0–46.0)
Hemoglobin: 13.1 g/dL (ref 12.0–15.0)
MCHC: 32.6 g/dL (ref 30.0–36.0)
MCV: 78.5 fl (ref 78.0–100.0)
Platelets: 350 10*3/uL (ref 150.0–400.0)
RBC: 5.12 Mil/uL — ABNORMAL HIGH (ref 3.87–5.11)
RDW: 13.9 % (ref 11.5–15.5)
WBC: 8.2 10*3/uL (ref 4.0–10.5)

## 2022-05-04 LAB — LIPID PANEL
Cholesterol: 239 mg/dL — ABNORMAL HIGH (ref 0–200)
HDL: 54.6 mg/dL (ref >39.00)
LDL Cholesterol: 159 mg/dL — ABNORMAL HIGH (ref 0–99)
NonHDL: 184.5
Total CHOL/HDL Ratio: 4
Triglycerides: 127 mg/dL (ref 0.0–149.0)
VLDL: 25.4 mg/dL (ref 0.0–40.0)

## 2022-05-04 LAB — VITAMIN D 25 HYDROXY (VIT D DEFICIENCY, FRACTURES): VITD: 99.21 ng/mL (ref 30.00–100.00)

## 2022-05-04 LAB — HEMOGLOBIN A1C: Hgb A1c MFr Bld: 6.4 % (ref 4.6–6.5)

## 2022-05-10 NOTE — Progress Notes (Incomplete)
Patient Care Team: [name], MD as PCP - General (Internal Medicine) Delsa Bern, MD (Obstetrics and Gynecology) Sharyne Peach, MD (Ophthalmology) Juanita Craver, MD (Gastroenterology) Erroll Luna, MD as Consulting Physician (General Surgery) Nicholas Lose, MD as Consulting Physician (Hematology and Oncology) Kyung Rudd, MD as Consulting Physician (Radiation Oncology) Delice Bison Charlestine Massed, NP as Nurse Practitioner (Hematology and Oncology)  DIAGNOSIS: No diagnosis found.  SUMMARY OF ONCOLOGIC HISTORY: Oncology History  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 ([place])  12/28/2015 Initial Diagnosis   Right breast biopsy retroareolar: Invasive lobular cancer with LCIS grade 1 EF 70%, PR 20%, Ki-67 5%, HER-2 negative, ratio 1.17; screening call back for distortion, ultrasound 1 x 0.8 x 0.8 cm, T1b N0 stage IA clinical stage   02/02/2016 Surgery   Right mastectomy: Invasive lobular cancer grade 1, 2.5 cm, involves nipple submucosa, LVI present, LCIS, margins negative, 0/1 lymph node, T2 N0 stage II a, ER 70%, PR 20%, HER-2 negative, Ki-67 5% , Oncotype DX score 26, 17% ROR intermediate risk   03/08/2016 -  Anti-estrogen oral therapy   Anastrozole 1 mg daily     CHIEF COMPLIANT:   INTERVAL HISTORY: Cathy Woodward is a   ALLERGIES:  is allergic to doxycycline, erythromycin, minocin [minocycline hcl], aspirin, lansoprazole, latex, nickel, and vitamin e.  MEDICATIONS:  Current Outpatient Medications  Medication Sig Dispense Refill   anastrozole (ARIMIDEX) 1 MG tablet Take 1 tablet (1 mg total) by mouth daily. 90 tablet 4   Ascorbic Acid (VITAMIN C) 500 MG tablet Take 500 mg by mouth daily.       Cholecalciferol (VITAMIN D) 50 MCG (2000 UT) CAPS Take 1 capsule (2,000 Units total) by mouth daily. 30 capsule    diphenhydrAMINE (BENADRYL) 25 MG tablet Take 25 mg by mouth daily.     famotidine (PEPCID) 20 MG tablet Take 20 mg by mouth 2 (two) times daily.      fluticasone (FLONASE) 50 MCG/ACT nasal spray Place 2 sprays into the nose daily. 48 g 3   Magnesium 250 MG TABS Take 250 mg by mouth 3 (three) times a week. 3 times a week     Multiple Vitamin (MULTIVITAMIN) tablet Take 1 tablet by mouth daily.     naproxen sodium (ANAPROX) 220 MG tablet Take 440 mg by mouth 2 (two) times daily as needed (pain).     sodium chloride (OCEAN) 0.65 % SOLN nasal spray Place 2 sprays into both nostrils daily.     Zinc 50 MG TABS Take 50 mg by mouth daily.     No current facility-administered medications for this visit.    PHYSICAL EXAMINATION: ECOG PERFORMANCE STATUS: {CHL ONC ECOG [phone]}  There were no vitals filed for this visit. There were no vitals filed for this visit.  BREAST:*** No palpable masses or nodules in either right or left breasts. No palpable axillary supraclavicular or infraclavicular adenopathy no breast tenderness or nipple discharge. (exam performed in the presence of a chaperone)  LABORATORY DATA:  I have reviewed the data as listed    Latest Ref Rng & Units 05/04/2022    7:45 AM 03/24/2021    9:12 AM 01/01/2020    8:50 AM  CMP  Glucose 70 - 99 mg/dL 86  84  79   BUN 6 - 23 mg/dL _0 Creatinine 0.40 - 1.20 mg/dL 0.81  0.78  0.66   Sodium 135 - 145 mEq/L 140  139  141  Potassium 3.5 - 5.1 mEq/L 3.7  3.8  4.3   Chloride 96 - 112 mEq/L 102  103  103   CO2 19 - 32 mEq/L _0 Calcium 8.4 - 10.5 mg/dL 10.0  9.7  10.0   Total Protein 6.0 - 8.3 g/dL 7.9  7.6  7.4   Total Bilirubin 0.2 - 1.2 mg/dL 0.5  0.7  0.5   Alkaline Phos 39 - 117 U/L 81  80    AST 0 - 37 U/L _1 ALT 0 - 35 U/L _2 Lab Results  Component Value Date   WBC 8.2 05/04/2022   HGB 13.1 05/04/2022   HCT 40.2 05/04/2022   MCV 78.5 05/04/2022   PLT 350.0 05/04/2022   NEUTROABS 3,198 01/01/2020    ASSESSMENT & PLAN:  No problem-specific Assessment & Plan notes found for this encounter.    No orders of the defined  types were placed in this encounter.  The patient has a good understanding of the overall plan. she agrees with it. she will call with any problems that may develop before the next visit here. Total time spent: 30 mins including face to face time and time spent for planning, charting and co-ordination of care   Suzzette Righter, [place] 05/10/22    I Gardiner Coins am acting as a Education administrator for [employer]  ***

## 2022-05-11 ENCOUNTER — Inpatient Hospital Stay: Payer: Self-pay | Admitting: Hematology and Oncology

## 2022-05-24 NOTE — Progress Notes (Signed)
Patient Care Team: [name], MD as PCP - General (Internal Medicine) Delsa Bern, MD (Obstetrics and Gynecology) Sharyne Peach, MD (Ophthalmology) Juanita Craver, MD (Gastroenterology) Erroll Luna, MD as Consulting Physician (General Surgery) Nicholas Lose, MD as Consulting Physician (Hematology and Oncology) Kyung Rudd, MD as Consulting Physician (Radiation Oncology) Delice Bison Charlestine Massed, NP as Nurse Practitioner (Hematology and Oncology)  DIAGNOSIS:  Encounter Diagnoses  Name Primary?   Malignant neoplasm of central portion of right breast in female, estrogen receptor positive ([place]) Yes   Post-menopausal     SUMMARY OF ONCOLOGIC HISTORY: Oncology History  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 ([place])  12/28/2015 Initial Diagnosis   Right breast biopsy retroareolar: Invasive lobular cancer with LCIS grade 1 EF 70%, PR 20%, Ki-67 5%, HER-2 negative, ratio 1.17; screening call back for distortion, ultrasound 1 x 0.8 x 0.8 cm, T1b N0 stage IA clinical stage   02/02/2016 Surgery   Right mastectomy: Invasive lobular cancer grade 1, 2.5 cm, involves nipple submucosa, LVI present, LCIS, margins negative, 0/1 lymph node, T2 N0 stage II a, ER 70%, PR 20%, HER-2 negative, Ki-67 5% , Oncotype DX score 26, 17% ROR intermediate risk   03/08/2016 -  Anti-estrogen oral therapy   Anastrozole 1 mg daily     CHIEF COMPLIANT:  Follow-up of right breast cancer on anastrozole therapy   INTERVAL HISTORY: Cathy Woodward is a 61 y.o. with above-mentioned history of  right breast cancer treated with mastectomy and who is currently on anastrozole therapy. She presents to the clinic for a follow-up. She is tolerating the anastrozole extremely well with no side effects or concerns.    ALLERGIES:  is allergic to doxycycline, erythromycin, minocin [minocycline hcl], minocycline, aspirin, lansoprazole, latex, nickel, and vitamin e.  MEDICATIONS:  Current Outpatient  Medications  Medication Sig Dispense Refill   anastrozole (ARIMIDEX) 1 MG tablet Take 1 tablet (1 mg total) by mouth daily. 90 tablet 4   Ascorbic Acid (VITAMIN C) 500 MG tablet Take by mouth daily.     Cholecalciferol (VITAMIN D) 50 MCG (2000 UT) CAPS Take 1 capsule (2,000 Units total) by mouth daily. 30 capsule    diphenhydrAMINE (BENADRYL) 25 MG tablet Take 25 mg by mouth daily.     famotidine (PEPCID) 20 MG tablet Take 20 mg by mouth 2 (two) times daily.     fluticasone (FLONASE) 50 MCG/ACT nasal spray Place 2 sprays into the nose daily. 48 g 3   Magnesium 250 MG TABS Take 250 mg by mouth 3 (three) times a week. 3 times a week     Multiple Vitamin (MULTIVITAMIN) tablet Take 1 tablet by mouth daily.     naproxen sodium (ANAPROX) 220 MG tablet Take 440 mg by mouth 2 (two) times daily as needed (pain).     sodium chloride (OCEAN) 0.65 % SOLN nasal spray Place 2 sprays into both nostrils daily.     Zinc 50 MG TABS Take 50 mg by mouth daily.     No current facility-administered medications for this visit.    PHYSICAL EXAMINATION: ECOG PERFORMANCE STATUS: 1 - Symptomatic but completely ambulatory  Vitals:   05/29/22 1035  BP: 131/63  Pulse: 75  Temp: 98.5 F (36.9 C)  SpO2: 100%   Filed Weights   05/29/22 1035  Weight: 106 lb (48.1 kg)    BREAST: No palpable lumps or nodules in the right chest wall or axilla.  Left breast is without any lumps or nodules.  (exam performed  in the presence of a chaperone)  LABORATORY DATA:  I have reviewed the data as listed    Latest Ref Rng & Units 05/04/2022    7:45 AM 03/24/2021    9:12 AM 01/01/2020    8:50 AM  CMP  Glucose 70 - 99 mg/dL 86  84  79   BUN 6 - 23 mg/dL _0 Creatinine 0.40 - 1.20 mg/dL 0.81  0.78  0.66   Sodium 135 - 145 mEq/L 140  139  141   Potassium 3.5 - 5.1 mEq/L 3.7  3.8  4.3   Chloride 96 - 112 mEq/L 102  103  103   CO2 19 - 32 mEq/L _1 Calcium 8.4 - 10.5 mg/dL 10.0  9.7  10.0   Total Protein 6.0  - 8.3 g/dL 7.9  7.6  7.4   Total Bilirubin 0.2 - 1.2 mg/dL 0.5  0.7  0.5   Alkaline Phos 39 - 117 U/L 81  80    AST 0 - 37 U/L _2 ALT 0 - 35 U/L _3 Lab Results  Component Value Date   WBC 8.2 05/04/2022   HGB 13.1 05/04/2022   HCT 40.2 05/04/2022   MCV 78.5 05/04/2022   PLT 350.0 05/04/2022   NEUTROABS 3,198 01/01/2020    ASSESSMENT & PLAN:  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 ([place]) Right mastectomy 02/02/2016: Invasive lobular cancer grade 1, 2.5 cm, involves nipple submucosa, LVI present, LCIS, margins negative, 0/1 lymph node, T2 N0 stage II a, ER 70%, PR 20%, HER-2 negative, Ki-67 5%    Oncotype DX score 26, intermediate risk, 17% risk of recurrence with tamoxifen alone (Offered adjuvant chemo with TC: Patient refused)   Current Treatment: Anastrozole 1 mg daily started 03/08/16 (patient is interested in taking it for 10 years)   Anastrozole Toxicities: Occasional hot flashes Slight tenosynovitis of the left wrist: Resolved with lemon peel soaked in olive oil   Breast cancer surveillance:  1. Breast exam 05/29/2022: Benign 2. annual mammograms left breast 12/01/2021: Benign, breast density category B 3. breast MRIs 04/24/2021: No evidence of malignancy in the left breast to explain the cause of the pain Plan to do breast MRIs every 3 years. Will obtain MRI breast in Oct 2025 I also recommended obtaining a bone density test along with her mammogram in July.   Return to clinic in December 2024 for follow-up      Orders Placed This Encounter  Procedures   DG Bone Density    Standing Status:   Future    Standing Expiration Date:   07/28/2022    Scheduling Instructions:     Do it alone with mammogram    Order Specific Question:   Reason for Exam (SYMPTOM  OR DIAGNOSIS REQUIRED)    Answer:   post menopausaul    Order Specific Question:   Preferred imaging location?    Answer:   [facility]   The patient has a good understanding  of the overall plan. she agrees with it. she will call with any problems that may develop before the next visit here. Total time spent: 30 mins including face to face time and time spent for planning, charting and co-ordination of care   Harriette Ohara, MD 05/29/22

## 2022-05-29 ENCOUNTER — Inpatient Hospital Stay
Payer: No Typology Code available for payment source | Attending: Hematology and Oncology | Admitting: Hematology and Oncology

## 2022-05-29 ENCOUNTER — Other Ambulatory Visit: Payer: Self-pay

## 2022-05-29 VITALS — BP 131/63 | HR 75 | Temp 98.5°F | Wt 106.0 lb

## 2022-05-29 DIAGNOSIS — Z79899 Other long term (current) drug therapy: Secondary | ICD-10-CM | POA: Insufficient documentation

## 2022-05-29 DIAGNOSIS — Z170 Estrogen receptor positive status [ER+]: Secondary | ICD-10-CM

## 2022-05-29 DIAGNOSIS — Z886 Allergy status to analgesic agent status: Secondary | ICD-10-CM | POA: Insufficient documentation

## 2022-05-29 DIAGNOSIS — Z79811 Long term (current) use of aromatase inhibitors: Secondary | ICD-10-CM | POA: Insufficient documentation

## 2022-05-29 DIAGNOSIS — C50111 Malignant neoplasm of central portion of right female breast: Secondary | ICD-10-CM | POA: Insufficient documentation

## 2022-05-29 DIAGNOSIS — R232 Flushing: Secondary | ICD-10-CM | POA: Insufficient documentation

## 2022-05-29 DIAGNOSIS — Z881 Allergy status to other antibiotic agents status: Secondary | ICD-10-CM | POA: Insufficient documentation

## 2022-05-29 DIAGNOSIS — Z9011 Acquired absence of right breast and nipple: Secondary | ICD-10-CM | POA: Insufficient documentation

## 2022-05-29 DIAGNOSIS — Z780 Asymptomatic menopausal state: Secondary | ICD-10-CM

## 2022-05-29 MED ORDER — ANASTROZOLE 1 MG PO TABS: 1.0000 mg | ORAL_TABLET | Freq: Every day | ORAL | 4 refills | Status: DC

## 2022-05-29 NOTE — Assessment & Plan Note (Signed)
Right mastectomy 02/02/2016: Invasive lobular cancer grade 1, 2.5 cm, involves nipple submucosa, LVI present, LCIS, margins negative, 0/1 lymph node, T2 N0 stage II a, ER 70%, PR 20%, HER-2 negative, Ki-67 5%    Oncotype DX score 26, intermediate risk, 17% risk of recurrence with tamoxifen alone (Offered adjuvant chemo with TC: Patient refused)   Current Treatment: Anastrozole 1 mg daily started 03/08/16   Anastrozole Toxicities: Occasional hot flashes Slight tenosynovitis of the left wrist: Resolved with lemon peel soaked in olive oil   Breast cancer surveillance:  1. Breast exam 05/29/2022: Benign 2. annual mammograms left breast 12/01/2021: Benign, breast density category B 3. breast MRIs 04/24/2021: No evidence of malignancy in the left breast to explain the cause of the pain Plan to do breast MRIs every other year. Will obtain MRI breast in Oct 2024   Return to clinic in 1 year for follow-up

## 2022-12-24 ENCOUNTER — Other Ambulatory Visit (HOSPITAL_BASED_OUTPATIENT_CLINIC_OR_DEPARTMENT_OTHER): Payer: Self-pay | Admitting: Internal Medicine

## 2022-12-24 DIAGNOSIS — Z1231 Encounter for screening mammogram for malignant neoplasm of breast: Secondary | ICD-10-CM

## 2022-12-25 ENCOUNTER — Telehealth: Payer: Self-pay

## 2022-12-25 NOTE — Telephone Encounter (Signed)
Telephoned patient at mobile [email or web address] Left a voice message with [facility] contact information.

## 2022-12-31 ENCOUNTER — Ambulatory Visit
Admission: RE | Admit: 2022-12-31 | Discharge: 2022-12-31 | Disposition: A | Discharge status: Home / Self Care | Payer: No Typology Code available for payment source | Source: Ambulatory Visit | Attending: Internal Medicine | Admitting: Internal Medicine

## 2022-12-31 DIAGNOSIS — Z1231 Encounter for screening mammogram for malignant neoplasm of breast: Secondary | ICD-10-CM

## 2023-02-11 ENCOUNTER — Ambulatory Visit (INDEPENDENT_AMBULATORY_CARE_PROVIDER_SITE_OTHER): Payer: Self-pay | Admitting: Internal Medicine

## 2023-02-11 ENCOUNTER — Encounter: Payer: Self-pay | Admitting: Internal Medicine

## 2023-02-11 VITALS — BP 114/74 | HR 75 | Temp 98.3°F | Ht 67.75 in | Wt 107.0 lb

## 2023-02-11 DIAGNOSIS — E782 Mixed hyperlipidemia: Secondary | ICD-10-CM

## 2023-02-11 DIAGNOSIS — R7303 Prediabetes: Secondary | ICD-10-CM

## 2023-02-11 NOTE — Assessment & Plan Note (Signed)
Checking HgA1c and adjust as needed not on meds.

## 2023-02-11 NOTE — Assessment & Plan Note (Signed)
Checking lipid panel and adjust as needed not on meds.

## 2023-02-11 NOTE — Progress Notes (Signed)
Subjective:   Patient ID: Cathy Woodward, female    DOB: August 17, 1960, 62 y.o.   MRN: [id number]  HPI The patient is a 62 YO female coming in for well check  Review of Systems  Constitutional: Negative.   HENT: Negative.    Eyes: Negative.   Respiratory:  Negative for cough, chest tightness and shortness of breath.   Cardiovascular:  Negative for chest pain, palpitations and leg swelling.  Gastrointestinal:  Negative for abdominal distention, abdominal pain, constipation, diarrhea, nausea and vomiting.  Musculoskeletal: Negative.   Skin: Negative.   Neurological: Negative.   Psychiatric/Behavioral: Negative.      Objective:  Physical Exam Constitutional:      Appearance: She is well-developed.  HENT:     Head: Normocephalic and atraumatic.  Cardiovascular:     Rate and Rhythm: Normal rate and regular rhythm.  Pulmonary:     Effort: Pulmonary effort is normal. No respiratory distress.     Breath sounds: Normal breath sounds. No wheezing or rales.  Abdominal:     General: Bowel sounds are normal. There is no distension.     Palpations: Abdomen is soft.     Tenderness: There is no abdominal tenderness. There is no rebound.  Musculoskeletal:     Cervical back: Normal range of motion.  Skin:    General: Skin is warm and dry.  Neurological:     Mental Status: She is alert and oriented to person, place, and time.     Coordination: Coordination normal.     Vitals:   02/11/23 1036  BP: 114/74  Pulse: 75  Temp: 98.3 F (36.8 C)  TempSrc: Oral  SpO2: 96%  Weight: 107 lb (48.5 kg)  Height: 5' 7.75" (1.721 m)    Assessment & Plan:

## 2023-05-03 ENCOUNTER — Ambulatory Visit: Payer: No Typology Code available for payment source | Admitting: Hematology and Oncology

## 2023-05-09 NOTE — Assessment & Plan Note (Signed)
Right mastectomy 02/02/2016: Invasive lobular cancer grade 1, 2.5 cm, involves nipple submucosa, LVI present, LCIS, margins negative, 0/1 lymph node, T2 N0 stage II a, ER 70%, PR 20%, HER-2 negative, Ki-67 5%    Oncotype DX score 26, intermediate risk, 17% risk of recurrence with tamoxifen alone (Offered adjuvant chemo with TC: Patient refused)   Current Treatment: Anastrozole 1 mg daily started 03/08/16 (patient is interested in taking it for 10 years)   Anastrozole Toxicities: Occasional hot flashes Slight tenosynovitis of the left wrist: Resolved with lemon peel soaked in olive oil   Breast cancer surveillance:  1. Breast exam 05/10/2023: Benign 2. annual mammograms left breast 01/01/2023: Benign, breast density category D 3. breast MRIs 04/24/2021: No evidence of malignancy in the left breast to explain the cause of the pain  Plan to do breast MRIs every 3 years. Will obtain MRI breast in Oct 2025 I also recommended obtaining a bone density test along with her mammogram in July.   Return to clinic in December 2025 for follow-up

## 2023-05-10 ENCOUNTER — Inpatient Hospital Stay: Payer: Self-pay | Attending: Hematology and Oncology | Admitting: Hematology and Oncology

## 2023-05-10 VITALS — BP 113/60 | HR 81 | Temp 97.6°F | Resp 18 | Ht 67.75 in | Wt 107.7 lb

## 2023-05-10 DIAGNOSIS — Z1721 Progesterone receptor positive status: Secondary | ICD-10-CM | POA: Insufficient documentation

## 2023-05-10 DIAGNOSIS — Z886 Allergy status to analgesic agent status: Secondary | ICD-10-CM | POA: Insufficient documentation

## 2023-05-10 DIAGNOSIS — Z79899 Other long term (current) drug therapy: Secondary | ICD-10-CM | POA: Insufficient documentation

## 2023-05-10 DIAGNOSIS — Z79811 Long term (current) use of aromatase inhibitors: Secondary | ICD-10-CM | POA: Insufficient documentation

## 2023-05-10 DIAGNOSIS — Z170 Estrogen receptor positive status [ER+]: Secondary | ICD-10-CM | POA: Insufficient documentation

## 2023-05-10 DIAGNOSIS — M65932 Unspecified synovitis and tenosynovitis, left forearm: Secondary | ICD-10-CM | POA: Insufficient documentation

## 2023-05-10 DIAGNOSIS — C50111 Malignant neoplasm of central portion of right female breast: Secondary | ICD-10-CM | POA: Insufficient documentation

## 2023-05-10 DIAGNOSIS — Z881 Allergy status to other antibiotic agents status: Secondary | ICD-10-CM | POA: Insufficient documentation

## 2023-05-10 DIAGNOSIS — Z780 Asymptomatic menopausal state: Secondary | ICD-10-CM

## 2023-05-10 DIAGNOSIS — R232 Flushing: Secondary | ICD-10-CM | POA: Insufficient documentation

## 2023-05-10 MED ORDER — ANASTROZOLE 1 MG PO TABS: 1.0000 mg | ORAL_TABLET | Freq: Every day | ORAL | 4 refills | Status: DC

## 2023-05-10 NOTE — Progress Notes (Signed)
Patient Care Team: Myrlene Broker, MD as PCP - General (Internal Medicine) Silverio Lay, MD (Obstetrics and Gynecology) Elise Benne, MD (Ophthalmology) Charna Elizabeth, MD (Gastroenterology) Harriette Bouillon, MD as Consulting Physician (General Surgery) Serena Croissant, MD as Consulting Physician (Hematology and Oncology) Dorothy Puffer, MD as Consulting Physician (Radiation Oncology) Axel Filler Larna Daughters, NP as Nurse Practitioner (Hematology and Oncology)  DIAGNOSIS:  Encounter Diagnoses  Name Primary?   Malignant neoplasm of central portion of right breast in female, estrogen receptor positive (HCC) Yes   Post-menopausal     SUMMARY OF ONCOLOGIC HISTORY: Oncology History  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 (HCC)  12/28/2015 Initial Diagnosis   Right breast biopsy retroareolar: Invasive lobular cancer with LCIS grade 1 EF 70%, PR 20%, Ki-67 5%, HER-2 negative, ratio 1.17; screening call back for distortion, ultrasound 1 x 0.8 x 0.8 cm, T1b N0 stage IA clinical stage   02/02/2016 Surgery   Right mastectomy: Invasive lobular cancer grade 1, 2.5 cm, involves nipple submucosa, LVI present, LCIS, margins negative, 0/1 lymph node, T2 N0 stage II a, ER 70%, PR 20%, HER-2 negative, Ki-67 5% , Oncotype DX score 26, 17% ROR intermediate risk   03/08/2016 -  Anti-estrogen oral therapy   Anastrozole 1 mg daily     CHIEF COMPLIANT: Follow-up on anastrozole  HISTORY OF PRESENT ILLNESS: Discussed the use of AI scribe software for clinical note transcription with the patient, who gave verbal consent to proceed.  History of Present Illness   The patient, has been in overall good health with a H/O breast cancer on anastrozole therapy. She has been caring for her elderly mother in hospice since June, which has prevented her from working. 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 The patient has a history of high breast density, which has been monitored with  MRIs. She has not had any recent health issues and has not completed a bone density study that was previously discussed. The patient's breast density has varied in classification over the years, with the most recent mammogram classifying it as a D, the highest density.         ALLERGIES:  is allergic to doxycycline, erythromycin, minocin [minocycline hcl], minocycline, aspirin, lansoprazole, latex, nickel, and vitamin e.  MEDICATIONS:  Current Outpatient Medications  Medication Sig Dispense Refill   anastrozole (ARIMIDEX) 1 MG tablet Take 1 tablet (1 mg total) by mouth daily. 90 tablet 4   Ascorbic Acid (VITAMIN C) 500 MG tablet Take 1,000 mg by mouth daily.     Cholecalciferol (VITAMIN D) 50 MCG (2000 UT) CAPS Take 1 capsule (2,000 Units total) by mouth daily. 30 capsule    diphenhydrAMINE (BENADRYL) 25 MG tablet Take 25 mg by mouth 2 (two) times daily.     famotidine (PEPCID) 20 MG tablet Take 20 mg by mouth 2 (two) times daily.     fluticasone (FLONASE) 50 MCG/ACT nasal spray Place 2 sprays into the nose daily. 48 g 3   Multiple Vitamin (MULTIVITAMIN) tablet Take 1 tablet by mouth daily.     naproxen sodium (ANAPROX) 220 MG tablet Take 440 mg by mouth 2 (two) times daily as needed (pain).     sodium chloride (OCEAN) 0.65 % SOLN nasal spray Place 2 sprays into both nostrils daily.     Zinc 50 MG TABS Take 50 mg by mouth daily.     No current facility-administered medications for this visit.    PHYSICAL EXAMINATION: ECOG PERFORMANCE STATUS:  1 - Symptomatic but completely ambulatory  Vitals:   05/10/23 0812  BP: 113/60  Pulse: 81  Resp: 18  Temp: 97.6 F (36.4 C)  SpO2: 100%   Filed Weights   05/10/23 0812  Weight: 107 lb 11.2 oz (48.9 kg)    Physical Exam    No palpable lumps or nodules in right chest wall or left breast.     (exam performed in the presence of a chaperone)  LABORATORY DATA:  I have reviewed the data as listed    Latest Ref Rng & Units 02/13/2023     9:14 AM 05/04/2022    7:45 AM 03/24/2021    9:12 AM  CMP  Glucose 70 - 99 mg/dL 78  86  84   BUN 6 - 23 mg/dL 15  16  22    Creatinine 0.40 - 1.20 mg/dL 0.27  2.53  6.64   Sodium 135 - 145 mEq/L 139  140  139   Potassium 3.5 - 5.1 mEq/L 3.7  3.7  3.8   Chloride 96 - 112 mEq/L 103  102  103   CO2 19 - 32 mEq/L 29  29  28    Calcium 8.4 - 10.5 mg/dL 9.7  40.3  9.7   Total Protein 6.0 - 8.3 g/dL 7.4  7.9  7.6   Total Bilirubin 0.2 - 1.2 mg/dL 0.5  0.5  0.7   Alkaline Phos 39 - 117 U/L 81  81  80   AST 0 - 37 U/L 18  20  21    ALT 0 - 35 U/L 15  19  18      Lab Results  Component Value Date   WBC 6.9 02/13/2023   HGB 13.4 02/13/2023   HCT 41.7 02/13/2023   MCV 79.0 02/13/2023   PLT 220.0 02/13/2023   NEUTROABS 3,198 01/01/2020    ASSESSMENT & PLAN:  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 (HCC) Right mastectomy 02/02/2016: Invasive lobular cancer grade 1, 2.5 cm, involves nipple submucosa, LVI present, LCIS, margins negative, 0/1 lymph node, T2 N0 stage II a, ER 70%, PR 20%, HER-2 negative, Ki-67 5%    Oncotype DX score 26, intermediate risk, 17% risk of recurrence with tamoxifen alone (Offered adjuvant chemo with TC: Patient refused)   Current Treatment: Anastrozole 1 mg daily started 03/08/16 (patient is interested in taking it for 10 years)   Anastrozole Toxicities: Occasional hot flashes Slight tenosynovitis of the left wrist: Resolved with lemon peel soaked in olive oil   Breast cancer surveillance:  1. Breast exam 05/10/2023: Benign 2. annual mammograms left breast 01/01/2023: Benign, breast density category D 3. breast MRIs 04/24/2021: No evidence of malignancy in the left breast to explain the cause of the pain  We will switch her to from doing MRIs to contrast-enhanced mammograms. I also recommended obtaining a bone density test along with her mammogram in April 2025.   Mother is home with hospice. Return to clinic in 1 year for follow-up        Orders  Placed This Encounter  Procedures   MM 2D DIAG BILAT WITH CONTRAST BCG ONLY    Standing Status:   Future    Expected Date:   01/03/2024    Expiration Date:   05/09/2024    Reason for Exam (SYMPTOM  OR DIAGNOSIS REQUIRED):   Annual mammogram with high density breasts    If indicated for the ordered procedure, I authorize the administration of contrast media per Radiology protocol:  Yes    Does the patient have a contrast media/X-ray dye allergy?:   No    [facility]?:   [facility]    Release to patient:   Immediate   DG Bone Density    Standing Status:   Future    Expected Date:   09/13/2023    Expiration Date:   05/09/2024    Reason for Exam (SYMPTOM  OR DIAGNOSIS REQUIRED):   Post menopausal    Preferred imaging location?:   [facility]    Release to patient:   Immediate   The patient has a good understanding of the overall plan. she agrees with it. she will call with any problems that may develop before the next visit here. Total time spent: 30 mins including face to face time and time spent for planning, charting and co-ordination of care   Tamsen Meek, MD 05/10/23

## 2023-09-09 ENCOUNTER — Ambulatory Visit (HOSPITAL_BASED_OUTPATIENT_CLINIC_OR_DEPARTMENT_OTHER)
Admission: RE | Admit: 2023-09-09 | Discharge: 2023-09-09 | Disposition: A | Discharge status: Home / Self Care | Payer: Self-pay | Source: Ambulatory Visit | Attending: Hematology and Oncology | Admitting: Hematology and Oncology

## 2023-09-09 DIAGNOSIS — Z780 Asymptomatic menopausal state: Secondary | ICD-10-CM

## 2023-09-25 ENCOUNTER — Encounter: Payer: Self-pay | Admitting: Hematology and Oncology

## 2023-09-25 ENCOUNTER — Other Ambulatory Visit: Payer: Self-pay | Admitting: Hematology and Oncology

## 2023-09-25 MED ORDER — ALENDRONATE SODIUM 70 MG PO TABS: 70.0000 mg | ORAL_TABLET | ORAL | 3 refills | Status: AC

## 2023-09-25 NOTE — Progress Notes (Signed)
 I discussed the bone density with the patient which showed a T-score of -2.6: Osteoporosis.  She is agreeable to take bisphosphonate therapy and will take alendronate once a week.  I sent a prescription to her pharmacy.  She understands to take it with a large amount of water and not to lie down after taking it as well as to be careful about any dental procedures while on therapy.  She had a recent dental check which was fine.

## 2023-12-30 ENCOUNTER — Ambulatory Visit
Admission: RE | Admit: 2023-12-30 | Discharge: 2023-12-30 | Disposition: A | Discharge status: Home / Self Care | Payer: Self-pay | Source: Ambulatory Visit | Attending: Hematology and Oncology | Admitting: Hematology and Oncology

## 2023-12-30 DIAGNOSIS — Z170 Estrogen receptor positive status [ER+]: Secondary | ICD-10-CM

## 2023-12-30 MED ORDER — IOPAMIDOL (ISOVUE-370) INJECTION 76%
100.0000 mL | Freq: Once | INTRAVENOUS | Status: AC | PRN
  Administered 2023-12-30: 100 mL via INTRAVENOUS

## 2024-03-08 ENCOUNTER — Emergency Department (HOSPITAL_BASED_OUTPATIENT_CLINIC_OR_DEPARTMENT_OTHER)
Admission: EM | Admit: 2024-03-08 | Discharge: 2024-03-08 | Disposition: A | Discharge status: Home / Self Care | Attending: Emergency Medicine | Admitting: Emergency Medicine

## 2024-03-08 ENCOUNTER — Other Ambulatory Visit: Payer: Self-pay

## 2024-03-08 ENCOUNTER — Encounter (HOSPITAL_BASED_OUTPATIENT_CLINIC_OR_DEPARTMENT_OTHER): Payer: Self-pay

## 2024-03-08 ENCOUNTER — Emergency Department (HOSPITAL_BASED_OUTPATIENT_CLINIC_OR_DEPARTMENT_OTHER)

## 2024-03-08 DIAGNOSIS — Z853 Personal history of malignant neoplasm of breast: Secondary | ICD-10-CM | POA: Insufficient documentation

## 2024-03-08 DIAGNOSIS — M79605 Pain in left leg: Secondary | ICD-10-CM | POA: Insufficient documentation

## 2024-03-08 DIAGNOSIS — Z91040 Latex allergy status: Secondary | ICD-10-CM | POA: Insufficient documentation

## 2024-03-08 DIAGNOSIS — M79662 Pain in left lower leg: Secondary | ICD-10-CM

## 2024-03-08 NOTE — ED Triage Notes (Signed)
 Pt reports L calf pain x1 day. Pt reports recent drive back from [employer]. Pt concerned about DVT. Pt denies any hx of DVT. Pt reports increased pain on movement and palpation.

## 2024-03-08 NOTE — Discharge Instructions (Addendum)
 The ultrasound of your left leg did not show any signs of a blood clot or thrombophlebitis.  It is unclear as to the cause of your pain, but this could be from muscular strain.  You may continue to take up to 500 mg of naproxen every 12 hours as needed for pain.  Do not take any other NSAID medications such as ibuprofen (Motrin), Advil, BC powders if you are taking the naproxen.  You may apply heating packs to the left calf to help with pain.  Please follow-up with your PCP if pain is not starting to improve on its own within the next week.  Please return immediately to the emergency room if you have severe swelling in the left leg, severe increase in pain, numbness in the left leg, any other new or concerning symptoms

## 2024-03-08 NOTE — ED Provider Notes (Signed)
 [facility] [place] EMERGENCY DEPARTMENT AT [facility] Provider Note   CSN: [id number] Arrival date & time: 03/08/24  1640     Patient presents with: Leg Pain   Cathy Woodward is a 63 y.o. female with history of right breast neoplasm s/p mastectomy and on maintenance therapy with anastrozole , hyperlipidemia, presents with concern for left calf pain that has been ongoing for the past day.  She reports she recently drove back from the [facility] which was a 5-hour drive.  She denies any swelling or color change to her calf.  Reports the upper aspect of the left calf is tender to touch.  Denies any injuries to her legs.  Denies any numbness in the left leg.  She denies any history of blood clots.  Denies any recent hospitalizations or surgeries.  No history of blood clotting disorder.  She denies any chest pain or shortness of breath.    Leg Pain      Prior to Admission medications   Medication Sig Start Date End Date Taking? Authorizing Provider  alendronate  (FOSAMAX ) 70 MG tablet Take 1 tablet (70 mg total) by mouth once a week. Take with a full glass of water on an empty stomach. 09/25/23   Gudena, Vinay, MD  anastrozole  (ARIMIDEX ) 1 MG tablet Take 1 tablet (1 mg total) by mouth daily. 05/10/23   Gudena, Vinay, MD  Ascorbic Acid (VITAMIN C) 500 MG tablet Take 1,000 mg by mouth daily.    [provider]  Cholecalciferol (VITAMIN D ) 50 MCG (2000 UT) CAPS Take 1 capsule (2,000 Units total) by mouth daily. 05/02/20   Gudena, Vinay, MD  diphenhydrAMINE (BENADRYL) 25 MG tablet Take 25 mg by mouth 2 (two) times daily.    [provider]  famotidine  (PEPCID ) 20 MG tablet Take 20 mg by mouth 2 (two) times daily.    [provider]  fluticasone  (FLONASE ) 50 MCG/ACT nasal spray Place 2 sprays into the nose daily. 12/15/12   Norins, Michael E, MD  Multiple Vitamin (MULTIVITAMIN) tablet Take 1 tablet by mouth daily.    [provider]  naproxen sodium (ANAPROX)  220 MG tablet Take 440 mg by mouth 2 (two) times daily as needed (pain).    [provider]  sodium chloride  (OCEAN) 0.65 % SOLN nasal spray Place 2 sprays into both nostrils daily.    [provider]  Zinc 50 MG TABS Take 50 mg by mouth daily.    [provider]    Allergies: Doxycycline, Erythromycin, Minocin [minocycline hcl], Minocycline, Aspirin, Lansoprazole, Latex, Nickel, and Vitamin e    Review of Systems  Musculoskeletal:        Left calf pain    Updated Vital Signs BP 105/79 (BP Location: Left Arm)   Pulse 82   Temp 98.2 F (36.8 C) (Oral)   Resp 18   Ht 5' 8 (1.727 m)   Wt 51.7 kg   SpO2 100%   BMI 17.33 kg/m   Physical Exam Vitals and nursing note reviewed.  Constitutional:      Appearance: Normal appearance.  HENT:     Head: Atraumatic.  Cardiovascular:     Rate and Rhythm: Normal rate and regular rhythm.     Comments: 2+ pedal pulses bilaterally Pulmonary:     Effort: Pulmonary effort is normal.     Breath sounds: Normal breath sounds.  Musculoskeletal:     Comments: Left lower extremity:  General No obvious deformity. No erythema, edema, contusions, open wounds.  Lower extremities appear grossly symmetric bilaterally.  Palpation Mild tenderness to palpation of the proximal aspect of the left calf.  No tenderness to the distal aspect of the left calf.  No warmth to touch of the left left lower extremity.  No bony tenderness to palpation over the tibia or fibula  ROM Full knee flexion and extension bilaterally Full ankle plantarflexion and dorsiflexion bilaterally    Neurological:     General: No focal deficit present.     Mental Status: She is alert.     Comments: Sensation: Sensation intact throughout the bilateral lower extremities  Strength: 5/5 strength with resisted ankle plantarflexion and dorsiflexion bilaterally  Psychiatric:        Mood and Affect: Mood normal.        Behavior: Behavior normal.      (all labs ordered are listed, but only abnormal results are displayed) Labs Reviewed - No data to display  EKG: None  Radiology: US  Venous Img Lower Unilateral Left Result Date: 03/08/2024 CLINICAL DATA:  Left calf pain after long car ride EXAM: LEFT LOWER EXTREMITY VENOUS DOPPLER ULTRASOUND TECHNIQUE: Gray-scale sonography with compression, as well as color and duplex ultrasound, were performed to evaluate the deep venous system(s) from the level of the common femoral vein through the popliteal and proximal calf veins. COMPARISON:  None Available. FINDINGS: VENOUS Normal compressibility of the common femoral, superficial femoral, and popliteal veins, as well as the visualized calf veins. Visualized portions of profunda femoral vein and great saphenous vein unremarkable. No filling defects to suggest DVT on grayscale or color Doppler imaging. Doppler waveforms show normal direction of venous flow, normal respiratory plasticity and response to augmentation. Limited views of the contralateral common femoral vein are unremarkable. OTHER None. Limitations: none IMPRESSION: Negative. Electronically Signed   By: Limin  Xu M.D.   On: 03/08/2024 17:56     Procedures   Medications Ordered in the ED - No data to display                                  Medical Decision Making    Differential diagnosis includes but is not limited to DVT, thrombophlebitis, phlebitis, calf strain  ED Course:  Upon initial evaluation, patient is well-appearing, no acute distress.  Stable vitals.  Has tenderness to the more proximal aspect of the left calf, this is where she is reporting her left calf pain.  No obvious edema of the left lower extremity.  The lower extremities bilaterally appear grossly symmetric.  No overlying skin change of the left lower extremity such as erythema, wounds. Low concern for infectious etiology such as cellulitis. She denies any injury to the area.  No bony tenderness to  palpation of the left extremity.  No concern for fracture or dislocation.  She is neurovascularly intact in the bilateral lower extremities.  Will obtain ultrasound of the left lower extremity to evaluate for possible DVT.  Imaging Studies ordered: I ordered imaging studies including ultrasound left lower extremity I independently visualized the imaging with scope of interpretation limited to determining acute life threatening conditions related to emergency care. Imaging showed no signs of DVT in the left lower extremity I agree with the radiologist interpretation  Medications Given: None  Upon re-evaluation, patient remains well-appearing with stable vitals.  We discussed that her ultrasound of the left lower extremity did not reveal any blood clots.  Given the slight tenderness to the  proximal calf muscle, question if this could be a calf strain.  She did have some pain that worsened with plantarflexion of the left foot.  Patient stable and appropriate for discharge home.    Impression: Left calf pain  Disposition:  The patient was discharged home with instructions to continue taking her home naproxen as needed for pain.  May apply heating packs to the area to help with pain.  Follow-up with her PCP if symptoms or not starting to improve within the next week. Return precautions given and patient verbalized understanding.    Record Review: External records from outside source obtained and reviewed including oncology note from 05/10/2023     This chart was dictated using voice recognition software, Dragon. Despite the best efforts of this provider to proofread and correct errors, errors may still occur which can change documentation meaning.       Final diagnoses:  Pain of left calf    ED Discharge Orders     None          Veta Palma, PA-C 03/08/24 1811    Emil Share, DO 03/08/24 1927

## 2024-04-07 ENCOUNTER — Encounter: Payer: Self-pay | Admitting: Internal Medicine

## 2024-04-07 DIAGNOSIS — R7303 Prediabetes: Secondary | ICD-10-CM

## 2024-04-07 DIAGNOSIS — E782 Mixed hyperlipidemia: Secondary | ICD-10-CM

## 2024-04-27 ENCOUNTER — Other Ambulatory Visit: Payer: Self-pay

## 2024-04-27 DIAGNOSIS — E782 Mixed hyperlipidemia: Secondary | ICD-10-CM

## 2024-04-27 DIAGNOSIS — R7303 Prediabetes: Secondary | ICD-10-CM

## 2024-04-27 LAB — LIPID PANEL
Cholesterol: 259 mg/dL — ABNORMAL HIGH (ref 0–200)
HDL: 58 mg/dL (ref >39.00)
LDL Cholesterol: 170 mg/dL — ABNORMAL HIGH (ref 0–99)
NonHDL: 200.95
Total CHOL/HDL Ratio: 4
Triglycerides: 155 mg/dL — ABNORMAL HIGH (ref 0.0–149.0)
VLDL: 31 mg/dL (ref 0.0–40.0)

## 2024-04-27 LAB — CBC
HCT: 40.8 % (ref 36.0–46.0)
Hemoglobin: 13.4 g/dL (ref 12.0–15.0)
MCHC: 32.9 g/dL (ref 30.0–36.0)
MCV: 78.8 fl (ref 78.0–100.0)
Platelets: 237 K/uL (ref 150.0–400.0)
RBC: 5.18 Mil/uL — ABNORMAL HIGH (ref 3.87–5.11)
RDW: 14.5 % (ref 11.5–15.5)
WBC: 6.3 K/uL (ref 4.0–10.5)

## 2024-04-27 LAB — COMPREHENSIVE METABOLIC PANEL WITH GFR
ALT: 20 U/L (ref 0–35)
AST: 21 U/L (ref 0–37)
Albumin: 4.3 g/dL (ref 3.5–5.2)
Alkaline Phosphatase: 42 U/L (ref 39–117)
BUN: 19 mg/dL (ref 6–23)
CO2: 29 meq/L (ref 19–32)
Calcium: 9.6 mg/dL (ref 8.4–10.5)
Chloride: 103 meq/L (ref 96–112)
Creatinine, Ser: 0.79 mg/dL (ref 0.40–1.20)
GFR: 79.31 mL/min (ref >60.00)
Glucose, Bld: 87 mg/dL (ref 70–99)
Potassium: 3.9 meq/L (ref 3.5–5.1)
Sodium: 140 meq/L (ref 135–145)
Total Bilirubin: 0.5 mg/dL (ref 0.2–1.2)
Total Protein: 7.4 g/dL (ref 6.0–8.3)

## 2024-04-27 LAB — VITAMIN D 25 HYDROXY (VIT D DEFICIENCY, FRACTURES): VITD: 59.25 ng/mL (ref 30.00–100.00)

## 2024-04-27 LAB — HEMOGLOBIN A1C: Hgb A1c MFr Bld: 6 % (ref 4.6–6.5)

## 2024-04-29 ENCOUNTER — Inpatient Hospital Stay: Payer: Self-pay | Attending: Hematology and Oncology | Admitting: Hematology and Oncology

## 2024-04-29 VITALS — BP 134/62 | HR 94 | Temp 97.8°F | Resp 18 | Ht 68.0 in | Wt 117.1 lb

## 2024-04-29 DIAGNOSIS — Z79899 Other long term (current) drug therapy: Secondary | ICD-10-CM | POA: Insufficient documentation

## 2024-04-29 DIAGNOSIS — Z9011 Acquired absence of right breast and nipple: Secondary | ICD-10-CM | POA: Insufficient documentation

## 2024-04-29 DIAGNOSIS — Z170 Estrogen receptor positive status [ER+]: Secondary | ICD-10-CM | POA: Insufficient documentation

## 2024-04-29 DIAGNOSIS — R12 Heartburn: Secondary | ICD-10-CM | POA: Insufficient documentation

## 2024-04-29 DIAGNOSIS — Z1721 Progesterone receptor positive status: Secondary | ICD-10-CM | POA: Insufficient documentation

## 2024-04-29 DIAGNOSIS — M65932 Unspecified synovitis and tenosynovitis, left forearm: Secondary | ICD-10-CM | POA: Insufficient documentation

## 2024-04-29 DIAGNOSIS — Z7983 Long term (current) use of bisphosphonates: Secondary | ICD-10-CM | POA: Insufficient documentation

## 2024-04-29 DIAGNOSIS — N951 Menopausal and female climacteric states: Secondary | ICD-10-CM | POA: Insufficient documentation

## 2024-04-29 DIAGNOSIS — Z91040 Latex allergy status: Secondary | ICD-10-CM | POA: Insufficient documentation

## 2024-04-29 DIAGNOSIS — C50111 Malignant neoplasm of central portion of right female breast: Secondary | ICD-10-CM | POA: Insufficient documentation

## 2024-04-29 DIAGNOSIS — Z881 Allergy status to other antibiotic agents status: Secondary | ICD-10-CM | POA: Insufficient documentation

## 2024-04-29 DIAGNOSIS — M810 Age-related osteoporosis without current pathological fracture: Secondary | ICD-10-CM | POA: Insufficient documentation

## 2024-04-29 DIAGNOSIS — Z79811 Long term (current) use of aromatase inhibitors: Secondary | ICD-10-CM | POA: Insufficient documentation

## 2024-04-29 DIAGNOSIS — Z886 Allergy status to analgesic agent status: Secondary | ICD-10-CM | POA: Insufficient documentation

## 2024-04-29 DIAGNOSIS — R232 Flushing: Secondary | ICD-10-CM | POA: Insufficient documentation

## 2024-04-29 NOTE — Progress Notes (Signed)
 Patient Care Team: Rollene Almarie LABOR, MD as PCP - General (Internal Medicine) Darcel Pool, MD (Obstetrics and Gynecology) Robinson Idol, MD (Ophthalmology) Kristie Lamprey, MD (Gastroenterology) Vanderbilt Ned, MD as Consulting Physician (General Surgery) Odean Potts, MD as Consulting Physician (Hematology and Oncology) Dewey Rush, MD as Consulting Physician (Radiation Oncology) Crawford Morna Pickle, NP as Nurse Practitioner (Hematology and Oncology)  DIAGNOSIS:  Encounter Diagnosis  Name Primary?   Malignant neoplasm of central portion of right breast in female, estrogen receptor positive (HCC) Yes    SUMMARY OF ONCOLOGIC HISTORY: Oncology History  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 (HCC)  12/28/2015 Initial Diagnosis   Right breast biopsy retroareolar: Invasive lobular cancer with LCIS grade 1 EF 70%, PR 20%, Ki-67 5%, HER-2 negative, ratio 1.17; screening call back for distortion, ultrasound 1 x 0.8 x 0.8 cm, T1b N0 stage IA clinical stage   02/02/2016 Surgery   Right mastectomy: Invasive lobular cancer grade 1, 2.5 cm, involves nipple submucosa, LVI present, LCIS, margins negative, 0/1 lymph node, T2 N0 stage II a, ER 70%, PR 20%, HER-2 negative, Ki-67 5% , Oncotype DX score 26, 17% ROR intermediate risk   03/08/2016 -  Anti-estrogen oral therapy   Anastrozole  1 mg daily     CHIEF COMPLIANT: Surveillance of breast cancer  HISTORY OF PRESENT ILLNESS: Discussed the use of AI scribe software for clinical note transcription with the patient, who gave verbal consent to proceed.  History of Present Illness Cathy Woodward is a 63 year old female with osteoporosis who presents with concerns about bone density and a recent episode of leg pain.  She is concerned about her bone density and has started a bisphosphonate as prescribed. She takes it in the morning and has evening heartburn since starting. She is currently on anastrozole .  In October, she had  sudden calf pain while washing her car with transient cyanosis of the toes. Ultrasound at the emergency room the next day was normal. The discoloration resolved within a day, and she has had no recurrent pain, discoloration, cold digits, swelling, or pulse changes.  She had a contrast mammogram in August that was satisfactory.  She is planning travel to [place] with her sister for her mother's first death anniversary.     ALLERGIES:  is allergic to doxycycline, erythromycin, minocin [minocycline hcl], minocycline, aspirin, lansoprazole, latex, nickel, and vitamin e.  MEDICATIONS:  Current Outpatient Medications  Medication Sig Dispense Refill   alendronate  (FOSAMAX ) 70 MG tablet Take 1 tablet (70 mg total) by mouth once a week. Take with a full glass of water on an empty stomach. 12 tablet 3   anastrozole  (ARIMIDEX ) 1 MG tablet Take 1 tablet (1 mg total) by mouth daily. 90 tablet 4   Ascorbic Acid (VITAMIN C) 500 MG tablet Take 1,000 mg by mouth daily.     Cholecalciferol (VITAMIN D ) 50 MCG (2000 UT) CAPS Take 1 capsule (2,000 Units total) by mouth daily. 30 capsule    diphenhydrAMINE (BENADRYL) 25 MG tablet Take 25 mg by mouth 2 (two) times daily.     famotidine  (PEPCID ) 20 MG tablet Take 20 mg by mouth 2 (two) times daily.     fluticasone  (FLONASE ) 50 MCG/ACT nasal spray Place 2 sprays into the nose daily. 48 g 3   Multiple Vitamin (MULTIVITAMIN) tablet Take 1 tablet by mouth daily.     naproxen sodium (ANAPROX) 220 MG tablet Take 440 mg by mouth 2 (two) times daily as needed (pain).  sodium chloride  (OCEAN) 0.65 % SOLN nasal spray Place 2 sprays into both nostrils daily.     Zinc 50 MG TABS Take 50 mg by mouth daily.     No current facility-administered medications for this visit.    PHYSICAL EXAMINATION: ECOG PERFORMANCE STATUS: 1 - Symptomatic but completely ambulatory  Vitals:   04/29/24 0811  BP: 134/62  Pulse: 94  Resp: 18  Temp: 97.8 F (36.6 C)  SpO2: 99%   Filed  Weights   04/29/24 0811  Weight: 117 lb 1.6 oz (53.1 kg)    Physical Exam Breast exam: No palpable lumps or nodules in right chest wall or left breast   (exam performed in the presence of a chaperone)  LABORATORY DATA:  I have reviewed the data as listed    Latest Ref Rng & Units 04/27/2024    8:03 AM 02/13/2023    9:14 AM 05/04/2022    7:45 AM  CMP  Glucose 70 - 99 mg/dL 87  78  86   BUN 6 - 23 mg/dL 19  15  16    Creatinine 0.40 - 1.20 mg/dL 9.20  9.18  9.18   Sodium 135 - 145 mEq/L 140  139  140   Potassium 3.5 - 5.1 mEq/L 3.9  3.7  3.7   Chloride 96 - 112 mEq/L 103  103  102   CO2 19 - 32 mEq/L 29  29  29    Calcium  8.4 - 10.5 mg/dL 9.6  9.7  89.9   Total Protein 6.0 - 8.3 g/dL 7.4  7.4  7.9   Total Bilirubin 0.2 - 1.2 mg/dL 0.5  0.5  0.5   Alkaline Phos 39 - 117 U/L 42  81  81   AST 0 - 37 U/L 21  18  20    ALT 0 - 35 U/L 20  15  19      Lab Results  Component Value Date   WBC 6.3 04/27/2024   HGB 13.4 04/27/2024   HCT 40.8 04/27/2024   MCV 78.8 04/27/2024   PLT 237.0 04/27/2024   NEUTROABS 3,198 01/01/2020    ASSESSMENT & PLAN:  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 (HCC) Right mastectomy 02/02/2016: Invasive lobular cancer grade 1, 2.5 cm, involves nipple submucosa, LVI present, LCIS, margins negative, 0/1 lymph node, T2 N0 stage II a, ER 70%, PR 20%, HER-2 negative, Ki-67 5%    Oncotype DX score 26, intermediate risk, 17% risk of recurrence with tamoxifen alone (Offered adjuvant chemo with TC: Patient refused)   Current Treatment: Anastrozole  1 mg daily started 03/08/16 (patient is interested in taking it for 10 years)   Anastrozole  Toxicities: Occasional hot flashes Slight tenosynovitis of the left wrist: Resolved with lemon peel soaked in olive oil Tenderness in the left calf and bluish discoloration of the toes: Went to the emergency room and ultrasound was negative.  It has resolved.   Breast cancer surveillance:  1. Breast exam 04/29/2024:  Benign 2. annual contrast-enhanced mammograms left breast 12/30/2023: Benign, breast density category D 3. breast MRIs 04/24/2021: No evidence of malignancy in the left breast to explain the cause of the pain   We will switch her to from doing MRIs to contrast-enhanced mammograms. Bone density 09/11/2023: T-score -2.6: Osteoporosis.  Discussed the role of bisphosphonate therapy  She is going to [place] for 2 weeks to honor her mother's passing last year. Return to clinic in 1 year for follow-up ------------------------------------- Assessment and Plan Assessment & Plan Estrogen receptor  positive right breast cancer On anastrozole  with no new symptoms. Recent mammogram showed improvement. Discussed low thrombosis risk with anastrozole . - Continue anastrozole  therapy. - Scheduled follow-up mammogram for the second week of August.  Osteoporosis Bone density concerning. Fosamax  causing heartburn. Discussed administration to minimize side effects and importance of informing dental providers. - Continue Fosamax  therapy. - Advised to take Fosamax  on an empty stomach and remain upright. - Instructed to inform dental providers about Fosamax  use.      Orders Placed This Encounter  Procedures   MM 2D DIAG BILAT WITH CONTRAST BCG ONLY    Standing Status:   Future    Expected Date:   01/04/2025    Expiration Date:   04/29/2025    Reason for Exam (SYMPTOM  OR DIAGNOSIS REQUIRED):   Annual high risk mammograms    If indicated for the ordered procedure, I authorize the administration of contrast media per Radiology protocol:   Yes    Does the patient have a contrast media/X-ray dye allergy?:   No    Preferred [facility]?:   [facility]    Release to patient:   Immediate   The patient has a good understanding of the overall plan. she agrees with it. she will call with any problems that may develop before the next visit here.  I personally spent a total of 30 minutes in the care of the  patient today including preparing to see the patient, getting/reviewing separately obtained history, performing a medically appropriate exam/evaluation, counseling and educating, placing orders, referring and communicating with other health care professionals, documenting clinical information in the EHR, independently interpreting results, communicating results, and coordinating care.   Viinay K Pleshette Tomasini, MD 04/29/24

## 2024-04-29 NOTE — Assessment & Plan Note (Signed)
 Right mastectomy 02/02/2016: Invasive lobular cancer grade 1, 2.5 cm, involves nipple submucosa, LVI present, LCIS, margins negative, 0/1 lymph node, T2 N0 stage II a, ER 70%, PR 20%, HER-2 negative, Ki-67 5%    Oncotype DX score 26, intermediate risk, 17% risk of recurrence with tamoxifen alone (Offered adjuvant chemo with TC: Patient refused)   Current Treatment: Anastrozole  1 mg daily started 03/08/16 (patient is interested in taking it for 10 years)   Anastrozole  Toxicities: Occasional hot flashes Slight tenosynovitis of the left wrist: Resolved with lemon peel soaked in olive oil   Breast cancer surveillance:  1. Breast exam 04/29/2024: Benign 2. annual contrast-enhanced mammograms left breast 12/30/2023: Benign, breast density category D 3. breast MRIs 04/24/2021: No evidence of malignancy in the left breast to explain the cause of the pain   We will switch her to from doing MRIs to contrast-enhanced mammograms. Bone density 09/11/2023: T-score -2.6: Osteoporosis.  Discussed the role of bisphosphonate therapy  mother is home with hospice. Return to clinic in 1 year for follow-up

## 2024-04-30 ENCOUNTER — Ambulatory Visit: Payer: Self-pay | Admitting: Internal Medicine

## 2024-05-01 ENCOUNTER — Encounter: Payer: Self-pay | Admitting: Internal Medicine

## 2024-05-04 ENCOUNTER — Encounter: Payer: Self-pay | Admitting: Internal Medicine

## 2024-05-04 ENCOUNTER — Ambulatory Visit (INDEPENDENT_AMBULATORY_CARE_PROVIDER_SITE_OTHER): Payer: Self-pay | Admitting: Internal Medicine

## 2024-05-04 VITALS — BP 110/60 | HR 82 | Temp 98.3°F | Ht 68.0 in | Wt 118.0 lb

## 2024-05-04 DIAGNOSIS — R7303 Prediabetes: Secondary | ICD-10-CM

## 2024-05-04 DIAGNOSIS — J300 Vasomotor rhinitis: Secondary | ICD-10-CM | POA: Insufficient documentation

## 2024-05-04 DIAGNOSIS — L819 Disorder of pigmentation, unspecified: Secondary | ICD-10-CM | POA: Insufficient documentation

## 2024-05-04 DIAGNOSIS — E782 Mixed hyperlipidemia: Secondary | ICD-10-CM

## 2024-05-04 MED ORDER — IPRATROPIUM BROMIDE 0.06 % NA SOLN: 2.0000 | Freq: Four times a day (QID) | NASAL | 12 refills | Status: AC

## 2024-05-04 MED ORDER — HYDROQUINONE 4 % EX CREA: TOPICAL_CREAM | Freq: Two times a day (BID) | CUTANEOUS | 11 refills | Status: AC

## 2024-05-04 MED ORDER — ATORVASTATIN CALCIUM 20 MG PO TABS: 20.0000 mg | ORAL_TABLET | Freq: Every day | ORAL | 3 refills | Status: AC

## 2024-05-04 NOTE — Assessment & Plan Note (Signed)
 Rx hydroxyquinone 4% to use on the areas.

## 2024-05-04 NOTE — Assessment & Plan Note (Signed)
 Reviewed recent labs and continue monitoring.

## 2024-05-04 NOTE — Progress Notes (Signed)
   Subjective:   Patient ID: Cathy Woodward, female    DOB: 07/30/1960, 63 y.o.   MRN: [id number]  The patient is here for physical. Pertinent topics discussed: Discussed the use of AI scribe software for clinical note transcription with the patient, who gave verbal consent to proceed.  History of Present Illness Cathy Woodward is a 63 year old female who presents for an annual physical exam.  She has osteoporosis, confirmed by a DEXA scan, and is currently on Fosamax . To manage potential gastrointestinal side effects, she takes Pepcid , doubling the dose from Friday to Monday when she takes Fosamax  on Saturdays.  She has a history of vasomotor rhinitis, exacerbated by humidity, cold, and eating, causing rhinorrhea. Long-term steroid use has not fully controlled her symptoms.   PMH, [facility], social history reviewed and updated  Review of Systems  Constitutional: Negative.   HENT:  Positive for rhinorrhea.   Eyes: Negative.   Respiratory:  Negative for cough, chest tightness and shortness of breath.   Cardiovascular:  Negative for chest pain, palpitations and leg swelling.  Gastrointestinal:  Negative for abdominal distention, abdominal pain, constipation, diarrhea, nausea and vomiting.  Musculoskeletal: Negative.   Skin: Negative.   Neurological: Negative.   Psychiatric/Behavioral: Negative.      Objective:  Physical Exam Constitutional:      Appearance: She is well-developed.  HENT:     Head: Normocephalic and atraumatic.  Cardiovascular:     Rate and Rhythm: Normal rate and regular rhythm.  Pulmonary:     Effort: Pulmonary effort is normal. No respiratory distress.     Breath sounds: Normal breath sounds. No wheezing or rales.  Abdominal:     General: Bowel sounds are normal. There is no distension.     Palpations: Abdomen is soft.     Tenderness: There is no abdominal tenderness.  Musculoskeletal:     Cervical back: Normal range of motion.  Skin:    General: Skin is  warm and dry.  Neurological:     Mental Status: She is alert and oriented to person, place, and time.     Coordination: Coordination normal.     Vitals:   05/04/24 0809  BP: 110/60  Pulse: 82  Temp: 98.3 F (36.8 C)  TempSrc: Oral  SpO2: 97%  Weight: 118 lb (53.5 kg)  Height: 5' 8 (1.727 m)    Assessment & Plan:

## 2024-05-04 NOTE — Patient Instructions (Signed)
 We have sent in the atorvastatin .

## 2024-05-04 NOTE — Assessment & Plan Note (Signed)
 She would like to start statin rx lipitor 20 mg daily.

## 2024-05-04 NOTE — Assessment & Plan Note (Signed)
 Rx atrovent  nasal spray to use prn. Continue flonase .

## 2024-05-11 ENCOUNTER — Ambulatory Visit: Payer: Self-pay | Admitting: Hematology and Oncology

## 2024-05-27 ENCOUNTER — Encounter: Payer: Self-pay | Admitting: Hematology and Oncology

## 2024-05-29 ENCOUNTER — Other Ambulatory Visit: Payer: Self-pay

## 2024-05-29 MED ORDER — ANASTROZOLE 1 MG PO TABS: 1.0000 mg | ORAL_TABLET | Freq: Every day | ORAL | 4 refills | Status: AC

## 2025-04-29 ENCOUNTER — Inpatient Hospital Stay: Admitting: Hematology and Oncology
